# Patient Record
Sex: Female | Born: 1950 | Race: Black or African American | Hispanic: No | Smoking: Former smoker
Health system: Southern US, Community
[De-identification: ages and names within clinical notes are randomized; demographics above are authoritative.]

## PROBLEM LIST (undated history)

## (undated) DIAGNOSIS — I639 Cerebral infarction, unspecified: Secondary | ICD-10-CM

## (undated) DIAGNOSIS — E119 Type 2 diabetes mellitus without complications: Secondary | ICD-10-CM

## (undated) DIAGNOSIS — Z5189 Encounter for other specified aftercare: Secondary | ICD-10-CM

## (undated) DIAGNOSIS — K279 Peptic ulcer, site unspecified, unspecified as acute or chronic, without hemorrhage or perforation: Secondary | ICD-10-CM

## (undated) DIAGNOSIS — I2510 Atherosclerotic heart disease of native coronary artery without angina pectoris: Secondary | ICD-10-CM

## (undated) DIAGNOSIS — I10 Essential (primary) hypertension: Secondary | ICD-10-CM

## (undated) DIAGNOSIS — E7800 Pure hypercholesterolemia, unspecified: Secondary | ICD-10-CM

## (undated) HISTORY — DX: Cerebral infarction, unspecified: I63.9

---
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
## 1999-11-15 ENCOUNTER — Encounter: Admission: RE | Admit: 1999-11-15 | Discharge: 1999-11-15 | Payer: Self-pay | Admitting: *Deleted

## 1999-11-15 ENCOUNTER — Encounter: Payer: Self-pay | Admitting: *Deleted

## 1999-12-13 ENCOUNTER — Other Ambulatory Visit: Admission: RE | Admit: 1999-12-13 | Discharge: 1999-12-13 | Payer: Self-pay | Admitting: *Deleted

## 1999-12-27 ENCOUNTER — Encounter (INDEPENDENT_AMBULATORY_CARE_PROVIDER_SITE_OTHER): Payer: Self-pay | Admitting: Specialist

## 1999-12-27 ENCOUNTER — Other Ambulatory Visit: Admission: RE | Admit: 1999-12-27 | Discharge: 1999-12-27 | Payer: Self-pay | Admitting: *Deleted

## 2000-01-31 ENCOUNTER — Encounter (INDEPENDENT_AMBULATORY_CARE_PROVIDER_SITE_OTHER): Payer: Self-pay

## 2000-01-31 ENCOUNTER — Other Ambulatory Visit: Admission: RE | Admit: 2000-01-31 | Discharge: 2000-01-31 | Payer: Self-pay | Admitting: *Deleted

## 2000-03-17 ENCOUNTER — Encounter: Admission: RE | Admit: 2000-03-17 | Discharge: 2000-06-15 | Payer: Self-pay | Admitting: *Deleted

## 2000-04-15 ENCOUNTER — Other Ambulatory Visit: Admission: RE | Admit: 2000-04-15 | Discharge: 2000-04-15 | Payer: Self-pay | Admitting: *Deleted

## 2005-04-14 DIAGNOSIS — E119 Type 2 diabetes mellitus without complications: Secondary | ICD-10-CM

## 2005-04-14 DIAGNOSIS — E7800 Pure hypercholesterolemia, unspecified: Secondary | ICD-10-CM

## 2005-04-14 HISTORY — DX: Type 2 diabetes mellitus without complications: E11.9

## 2005-04-14 HISTORY — DX: Pure hypercholesterolemia, unspecified: E78.00

## 2005-06-25 ENCOUNTER — Emergency Department (HOSPITAL_COMMUNITY): Admission: EM | Admit: 2005-06-25 | Discharge: 2005-06-25 | Payer: Self-pay | Admitting: Emergency Medicine

## 2010-02-13 ENCOUNTER — Emergency Department (HOSPITAL_COMMUNITY): Admission: EM | Admit: 2010-02-13 | Discharge: 2010-02-13 | Payer: Self-pay | Admitting: Emergency Medicine

## 2010-06-25 LAB — URINALYSIS, ROUTINE W REFLEX MICROSCOPIC
Bilirubin Urine: NEGATIVE
Glucose, UA: NEGATIVE mg/dL
Ketones, ur: NEGATIVE mg/dL
Nitrite: NEGATIVE
Protein, ur: NEGATIVE mg/dL
Specific Gravity, Urine: 1.01 (ref 1.005–1.030)
Urobilinogen, UA: 0.2 mg/dL (ref 0.0–1.0)
pH: 5.5 (ref 5.0–8.0)

## 2010-06-25 LAB — CBC
HCT: 42.5 % (ref 36.0–46.0)
MCHC: 35.3 g/dL (ref 30.0–36.0)
MCV: 92.6 fL (ref 78.0–100.0)
RDW: 12.4 % (ref 11.5–15.5)

## 2010-06-25 LAB — DIFFERENTIAL
Basophils Relative: 1 % (ref 0–1)
Lymphs Abs: 1.3 10*3/uL (ref 0.7–4.0)
Monocytes Relative: 8 % (ref 3–12)
Neutro Abs: 3.7 10*3/uL (ref 1.7–7.7)
Neutrophils Relative %: 68 % (ref 43–77)

## 2010-06-25 LAB — COMPREHENSIVE METABOLIC PANEL
BUN: 10 mg/dL (ref 6–23)
Calcium: 9.4 mg/dL (ref 8.4–10.5)
Creatinine, Ser: 0.83 mg/dL (ref 0.4–1.2)
Glucose, Bld: 177 mg/dL — ABNORMAL HIGH (ref 70–99)
Total Protein: 8.2 g/dL (ref 6.0–8.3)

## 2010-06-25 LAB — URINE CULTURE: Culture  Setup Time: 201111021027

## 2010-06-25 LAB — URINE MICROSCOPIC-ADD ON

## 2011-10-09 IMAGING — CT CT ABD-PELV W/ CM
2 of 5 series · 17 of 46 positions shown, 19 images · IV contrast (APPLIED)
Comparison: None

CLINICAL DATA: Abdominal pain

CT ABDOMEN AND PELVIS WITH CONTRAST
TECHNIQUE: Multidetector CT imaging of the abdomen and pelvis was
performed following the standard protocol during bolus
administration of intravenous contrast.
Contrast: 100 ml of omni 300

[Series 2: abd/pelv with 5.0 b31f st · axial · 0.74mm/px · z∈[-478,-78]mm · 14 of 90 slices shown, 16 images]
[im 5/90  soft-tissue]
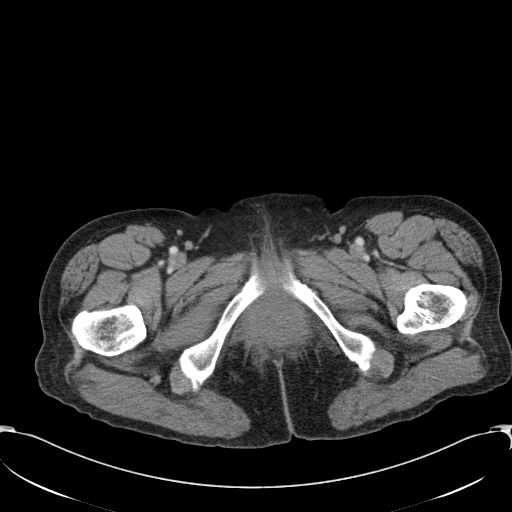
[im 5/90  bone]
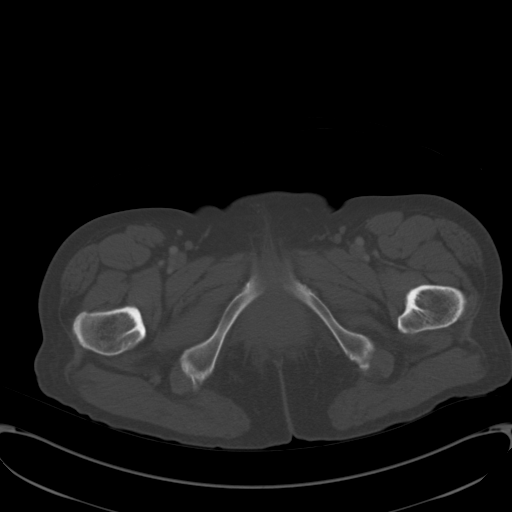
[im 10/90  soft-tissue]
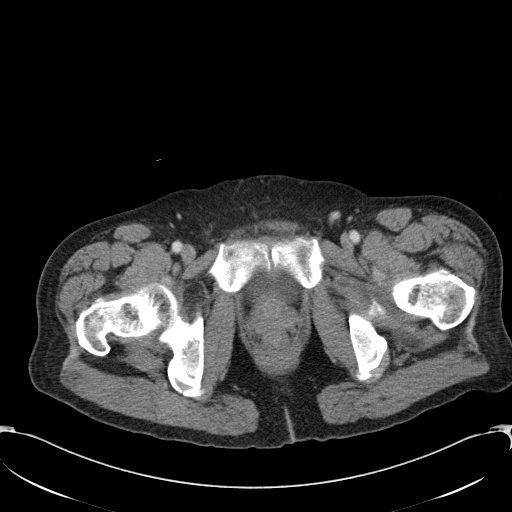
[im 19/90  soft-tissue]
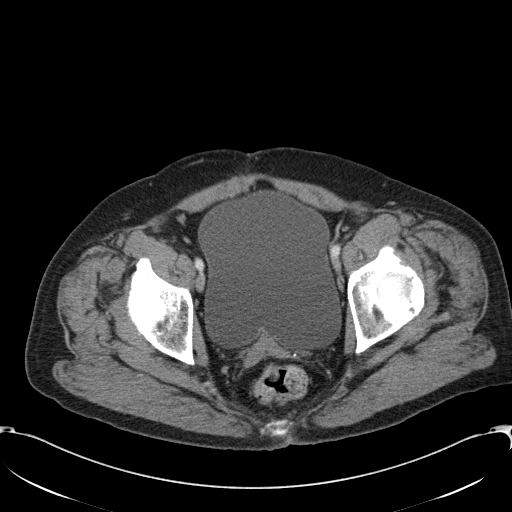
[im 24/90  soft-tissue]
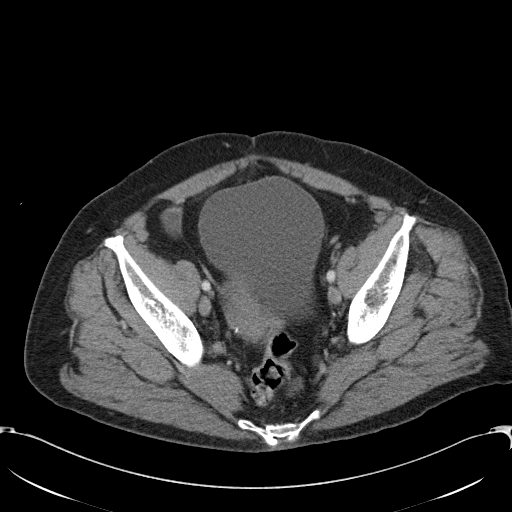
[im 29/90  soft-tissue]
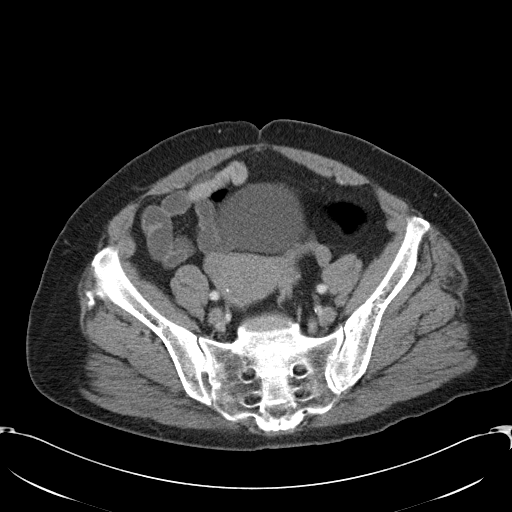
[im 38/90  soft-tissue]
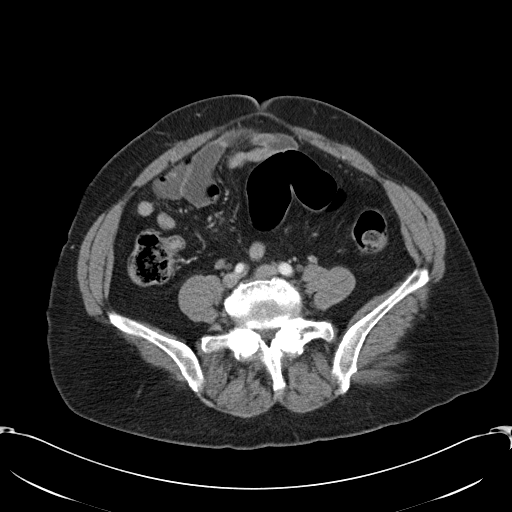
[im 43/90  soft-tissue]
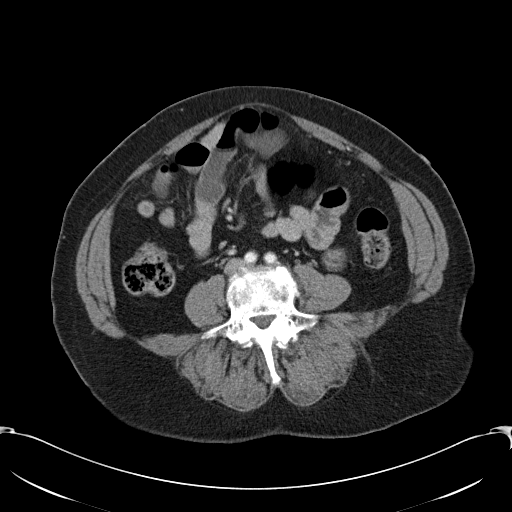
[im 47/90  soft-tissue]
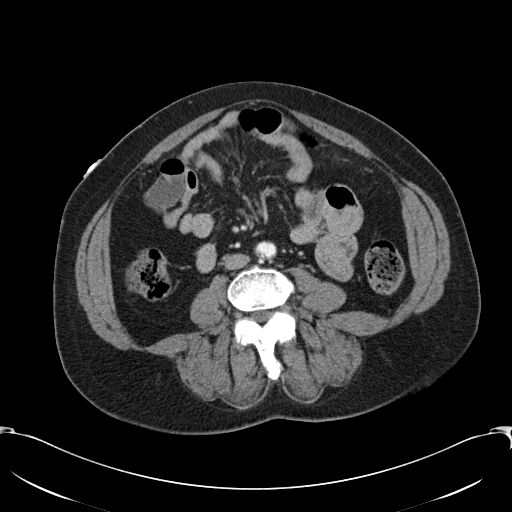
[im 52/90  soft-tissue]
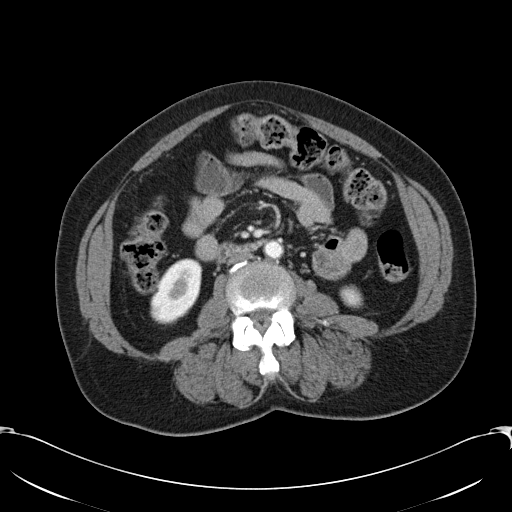
[im 52/90  bone]
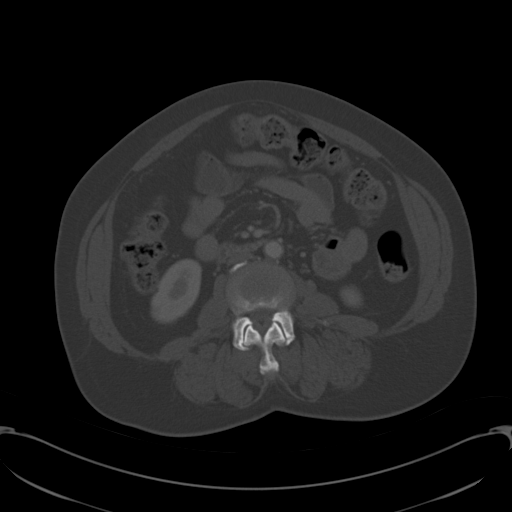
[im 61/90  soft-tissue]
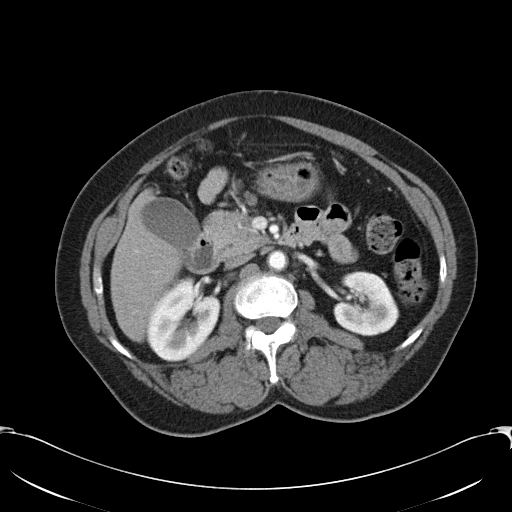
[im 66/90  soft-tissue]
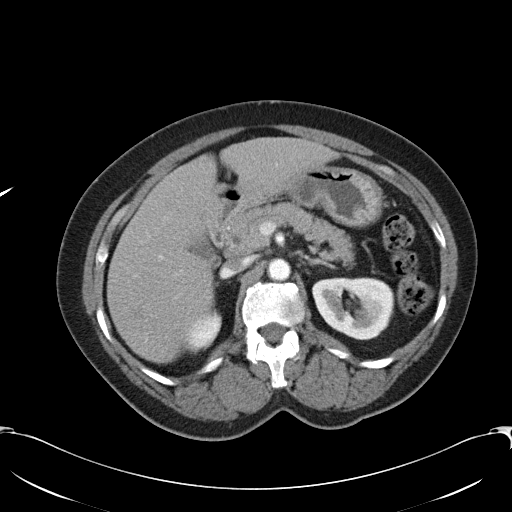
[im 71/90  soft-tissue]
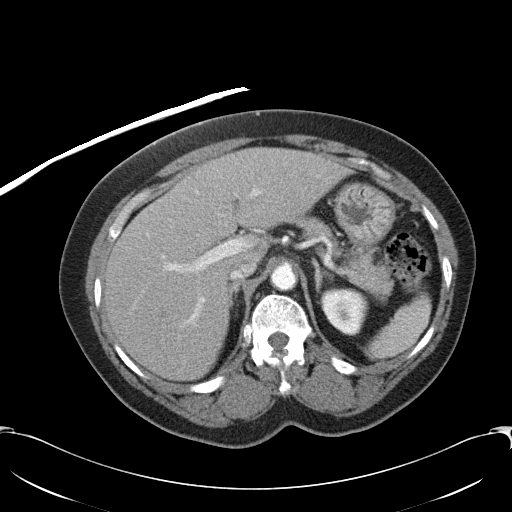
[im 80/90  soft-tissue]
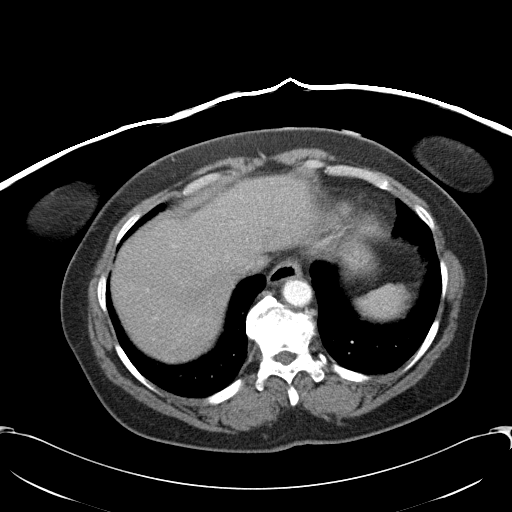
[im 85/90  soft-tissue]
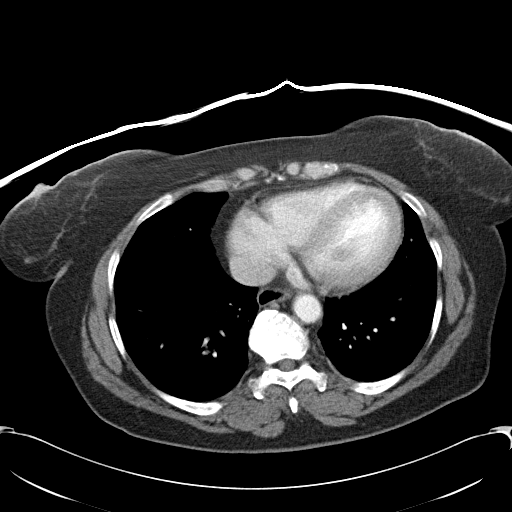

[Series 602: coronal · coronal · 0.87mm/px · 3 of 82 slices shown]
[im 28/82  soft-tissue]
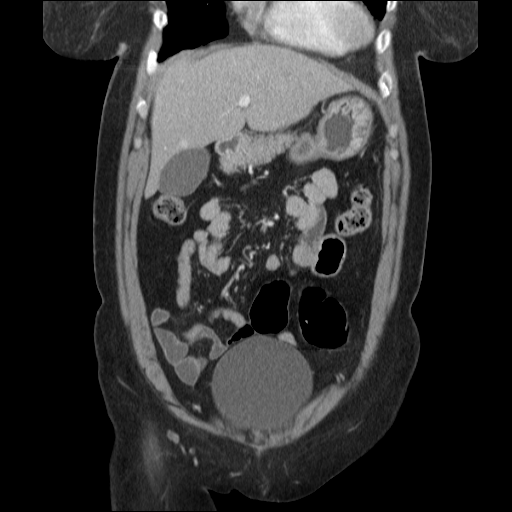
[im 37/82  soft-tissue]
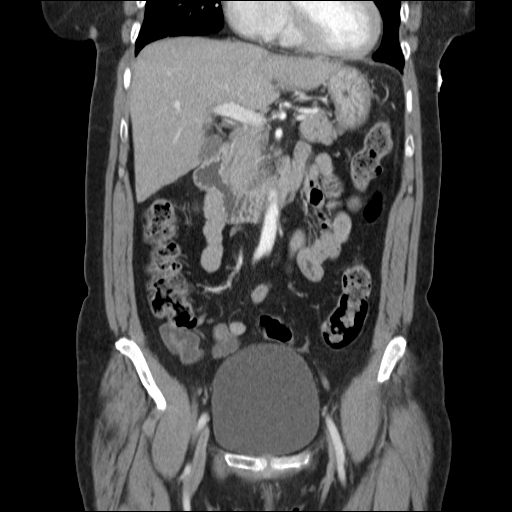
[im 46/82  soft-tissue]
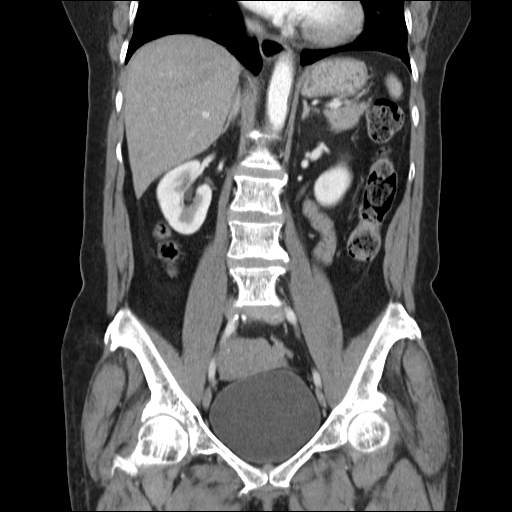

[17 of 46 positions shown; findings below may reference images not displayed]

FINDINGS: Mild diffuse ground glass attenuation throughout both lung bases
identified. Prominence of interlobular septa also noted.

No pleural effusion or pulmonary edema noted.

The spleen appears normal.

Negative pancreas.   The adrenal glands are unremarkable.  There
are no focal liver abnormalities identified.  The gallbladder
appears normal.

Both kidneys are unremarkable.  Tiny nonobstructing calculus is
identified within the mid right ureter measure the 3.8 mm.  This
does not result in any significant hydronephrosis or hydroureter.

 There are no enlarged upper abdominal or pelvic lymph nodes.
There is no free fluid or fluid collections within the abdomen or
pelvis.  Moderate distention of the urinary bladder identified.
The uterus and adnexal structures appear normal for patient's age.
The stomach, small bowel, and colon appear normal.  No evidence for
bowel perforation, bowel obstruction, or abscess formation.  The
appendix is not identified.
IMPRESSION: 1.  There is a tiny nonobstructing stone within the mid right
ureter measuring approximately 3-4 mm.
2.  Nonspecific interstitial abnormality is identified within both
lung bases.  This could be further assessed with high-resolution CT
of the chest and if clinically indicated.

## 2011-11-19 ENCOUNTER — Inpatient Hospital Stay (HOSPITAL_COMMUNITY)
Admission: EM | Admit: 2011-11-19 | Discharge: 2011-11-20 | DRG: 066 | Disposition: A | Discharge status: Home / Self Care | Payer: MEDICAID | Attending: Family Medicine | Admitting: Family Medicine

## 2011-11-19 ENCOUNTER — Encounter (HOSPITAL_COMMUNITY): Payer: Self-pay

## 2011-11-19 ENCOUNTER — Emergency Department (HOSPITAL_COMMUNITY): Payer: Self-pay

## 2011-11-19 DIAGNOSIS — R209 Unspecified disturbances of skin sensation: Secondary | ICD-10-CM | POA: Diagnosis present

## 2011-11-19 DIAGNOSIS — Z87891 Personal history of nicotine dependence: Secondary | ICD-10-CM

## 2011-11-19 DIAGNOSIS — I639 Cerebral infarction, unspecified: Secondary | ICD-10-CM

## 2011-11-19 DIAGNOSIS — E785 Hyperlipidemia, unspecified: Secondary | ICD-10-CM

## 2011-11-19 DIAGNOSIS — F17200 Nicotine dependence, unspecified, uncomplicated: Secondary | ICD-10-CM | POA: Diagnosis present

## 2011-11-19 DIAGNOSIS — I6350 Cerebral infarction due to unspecified occlusion or stenosis of unspecified cerebral artery: Principal | ICD-10-CM | POA: Diagnosis present

## 2011-11-19 DIAGNOSIS — E7800 Pure hypercholesterolemia, unspecified: Secondary | ICD-10-CM | POA: Diagnosis present

## 2011-11-19 DIAGNOSIS — Z8673 Personal history of transient ischemic attack (TIA), and cerebral infarction without residual deficits: Secondary | ICD-10-CM | POA: Diagnosis present

## 2011-11-19 DIAGNOSIS — I10 Essential (primary) hypertension: Secondary | ICD-10-CM

## 2011-11-19 DIAGNOSIS — Z79899 Other long term (current) drug therapy: Secondary | ICD-10-CM

## 2011-11-19 DIAGNOSIS — E119 Type 2 diabetes mellitus without complications: Secondary | ICD-10-CM | POA: Diagnosis present

## 2011-11-19 DIAGNOSIS — Z7982 Long term (current) use of aspirin: Secondary | ICD-10-CM

## 2011-11-19 HISTORY — DX: Pure hypercholesterolemia, unspecified: E78.00

## 2011-11-19 HISTORY — DX: Peptic ulcer, site unspecified, unspecified as acute or chronic, without hemorrhage or perforation: K27.9

## 2011-11-19 HISTORY — DX: Type 2 diabetes mellitus without complications: E11.9

## 2011-11-19 HISTORY — DX: Encounter for other specified aftercare: Z51.89

## 2011-11-19 LAB — POCT I-STAT, CHEM 8
BUN: 10 mg/dL (ref 6–23)
Chloride: 104 mEq/L (ref 96–112)
Creatinine, Ser: 1 mg/dL (ref 0.50–1.10)
Sodium: 140 mEq/L (ref 135–145)
TCO2: 24 mmol/L (ref 0–100)

## 2011-11-19 LAB — CBC WITH DIFFERENTIAL/PLATELET
Eosinophils Relative: 1 % (ref 0–5)
HCT: 39.3 % (ref 36.0–46.0)
Lymphocytes Relative: 32 % (ref 12–46)
Lymphs Abs: 1.6 10*3/uL (ref 0.7–4.0)
MCV: 89.1 fL (ref 78.0–100.0)
Neutro Abs: 2.9 10*3/uL (ref 1.7–7.7)
Platelets: 226 10*3/uL (ref 150–400)
RBC: 4.41 MIL/uL (ref 3.87–5.11)
WBC: 4.9 10*3/uL (ref 4.0–10.5)

## 2011-11-19 LAB — GLUCOSE, CAPILLARY: Glucose-Capillary: 203 mg/dL — ABNORMAL HIGH (ref 70–99)

## 2011-11-19 LAB — POCT I-STAT TROPONIN I

## 2011-11-19 MED ORDER — ONDANSETRON HCL 4 MG PO TABS
4.0000 mg | ORAL_TABLET | Freq: Three times a day (TID) | ORAL | Status: DC | PRN
Start: 2011-11-19 — End: 2011-11-20

## 2011-11-19 MED ORDER — CLOPIDOGREL BISULFATE 75 MG PO TABS
75.0000 mg | ORAL_TABLET | Freq: Every day | ORAL | Status: DC
  Administered 2011-11-20: 75 mg via ORAL
  Filled 2011-11-19: qty 1

## 2011-11-19 MED ORDER — HYDRALAZINE HCL 20 MG/ML IJ SOLN
5.0000 mg | Freq: Four times a day (QID) | INTRAMUSCULAR | Status: DC | PRN
  Filled 2011-11-19: qty 0.25

## 2011-11-19 MED ORDER — ENOXAPARIN SODIUM 40 MG/0.4ML ~~LOC~~ SOLN
40.0000 mg | SUBCUTANEOUS | Status: DC
  Administered 2011-11-19: 40 mg via SUBCUTANEOUS
  Filled 2011-11-19 (×2): qty 0.4

## 2011-11-19 MED ORDER — ACETAMINOPHEN 650 MG RE SUPP: 650.0000 mg | RECTAL | Status: DC | PRN

## 2011-11-19 MED ORDER — ACETAMINOPHEN 325 MG PO TABS: 650.0000 mg | ORAL_TABLET | ORAL | Status: DC | PRN

## 2011-11-19 MED ORDER — INSULIN ASPART 100 UNIT/ML ~~LOC~~ SOLN
0.0000 [IU] | Freq: Three times a day (TID) | SUBCUTANEOUS | Status: DC
  Administered 2011-11-20: 2 [IU] via SUBCUTANEOUS
  Administered 2011-11-20: 1 [IU] via SUBCUTANEOUS
  Administered 2011-11-20: 2 [IU] via SUBCUTANEOUS

## 2011-11-19 NOTE — ED Provider Notes (Addendum)
Pt turned over to me at change of shift to get MR/MRA results.  BP improved to 180 range without treatment.   19:24 Dr Adriana Simas, [facility] will admit, wants to see in ED before putting in orders.    [name] Head Wo Contrast  Mr Brain Wo Contrast  11/19/2011  *RADIOLOGY REPORT*  Clinical Data:  61 year old female with dizziness left-sided weakness, numbness and hypertension.  Comparison: None.  MRI HEAD WITHOUT CONTRAST  Technique: Multiplanar, multiecho pulse sequences of the brain and surrounding structures were obtained according to standard protocol without intravenous contrast.  Findings: There is a 15 mm confluent area of restricted diffusion in the right brainstem extending from the lower mid brain through the pons.  There is associated T2 and FLAIR hyperintensity.  No significant mass effect.  No evidence of associated hemorrhage.  The diffusion elsewhere is within normal limits.  Major intracranial vascular flow voids are preserved, MRA findings are below.  No ventriculomegaly.  No midline shift or intracranial mass lesion. Negative pituitary.  Supratentorial gray and white matter signal is within normal limits for age.  The moderate to severe ligamentous hypertrophy about the odontoid appears to be degenerative and there is sclerosis of the odontoid. This results in effacement of the pre medullary cistern and mild stenosis at the cervicomedullary junction.  Elsewhere, the visualized cervical spine is within normal limits.  No marrow edema or evidence of acute osseous abnormality. Visualized orbit soft tissues are within normal limits.  Visualized paranasal sinuses and mastoids are clear.  IMPRESSION: 1.  Acute right brainstem infarct.  No mass effect or hemorrhage. 2.  No other acute intracranial abnormality. 3.  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 4.  MRA findings are below.  MRA HEAD WITHOUT CONTRAST  Technique:  Angiographic images of the Circle of Willis were obtained using MRA technique without  intravenous contrast.  Findings: Antegrade flow in the posterior circulation.  Mildly dominant distal left vertebral artery.  Normal left PICA.  Patent distal right vertebral artery.  Normal right PICA.  Patent vertebrobasilar junction.  The basilar artery is patent without stenosis.  SCA and PCA origins are within normal limits.  Posterior communicating arteries are diminutive or absent.  Bilateral PCA branches are  Antegrade flow in both ICA siphons.  There is bilateral ICA irregularity greater on the right.  There is moderate to distal petrous right ICA stenosis (series 7 image 65).  There is moderate left supraclinoid ICA stenosis.  The carotid termini remain patent.  Ophthalmic artery origins are within normal limits.  MCA origins are within normal limits.  Irregularity of the right ACA A1 segment with preserved distal flow.  Ectatic anterior communicating artery without discrete aneurysm, and a tiny median artery of the corpus callosum is suspected.  Other visualized ACA branches are within normal limits.  Visualized bilateral MCA branches are within normal limits.  IMPRESSION: 1.  Negative posterior circulation. 2. 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 3.  Right ACA A1 segment moderate to severe stenosis.  Original Report Authenticated By: Harley Hallmark, M.D.   Diagnoses that have been ruled out:  None  Diagnoses that are still under consideration:  None  Final diagnoses:  Stroke  Hypertension    Plan admission  Devoria Albe, MD, Franz Dell, MD 11/19/11 1925  Ward Givens, MD 11/19/11 1610

## 2011-11-19 NOTE — ED Notes (Signed)
Per ems, pt complains of dizziness since Monday after shower, has not taken medication in over 3 years.

## 2011-11-19 NOTE — H&P (Signed)
Lisa [name] is [name] 61 y.o. female.    Chief Complaint: Left sided numbness x 3 days HPI:  61 yo F with a medical history significant for HTN and DM2 untreated for the past three years presents with numbness on her L side x  3 days.  The numbness started mid-day after her shower. Numbness is located on left face, left upper extremity, and toes of left lower extremity. She denies associated weakness or paresthesia.  Associated symptoms include dizziness while standing.  She denies vision changes, headaches, weakness, urinary or fecal incontinence.   She also denies pain, fever, chills, night sweats, nausea, vomiting, SOB and chest pain.   In the ED, patient was hypertensive with blood pressure of 224/111.  Concern for stroke prompted ED to obtain basic labs and MRI and MRA.  Labs unremarkable except for elevated blood glucose of 236.  Past Medical History  Diagnosis Date  . Blood transfusion   . High cholesterol 2007  . Diabetes mellitus 2007  . Peptic ulcer 1965    Past Surgical History  Procedure Date  . Cesarean section     Family History  Problem Relation Age of Onset  . Cancer Sister     Lung   . Cancer Brother     Throat    Social History:  reports that she has been smoking Cigarettes.  She has a 7.5 pack-year smoking history. She does not have any smokeless tobacco history on file. She reports that she drinks alcohol. She reports that she does not use illicit drugs.  Allergies:  Allergies  Allergen Reactions  . Aspirin Other (See Comments)    Irritates ulcers  . Codeine     "upsets myulcers"  . Hydrocodone     "upsets my ulcers"     Results for orders placed during the hospital encounter of 11/19/11 (from the past 48 hour(s))  CBC WITH DIFFERENTIAL     Status: Normal   Collection Time   11/19/11  2:01 PM      Component Value Range Comment   WBC 4.9  4.0 - 10.5 K/uL    RBC 4.41  3.87 - 5.11 MIL/uL    Hemoglobin 13.6  12.0 - 15.0 g/dL    HCT 16.1  09.6 - 04.5 %      MCV 89.1  78.0 - 100.0 fL    MCH 30.8  26.0 - 34.0 pg    MCHC 34.6  30.0 - 36.0 g/dL    RDW 40.9  81.1 - 91.4 %    Platelets 226  150 - 400 K/uL    Neutrophils Relative 60  43 - 77 %    Neutro Abs 2.9  1.7 - 7.7 K/uL    Lymphocytes Relative 32  12 - 46 %    Lymphs Abs 1.6  0.7 - 4.0 K/uL    Monocytes Relative 7  3 - 12 %    Monocytes Absolute 0.4  0.1 - 1.0 K/uL    Eosinophils Relative 1  0 - 5 %    Eosinophils Absolute 0.1  0.0 - 0.7 K/uL    Basophils Relative 0  0 - 1 %    Basophils Absolute 0.0  0.0 - 0.1 K/uL   POCT I-STAT TROPONIN I     Status: Normal   Collection Time   11/19/11  2:18 PM      Component Value Range Comment   Troponin i, poc 0.00  0.00 - 0.08 ng/mL    Comment 3  POCT I-STAT, CHEM 8     Status: Abnormal   Collection Time   11/19/11  2:19 PM      Component Value Range Comment   Sodium 140  135 - 145 mEq/L    Potassium 4.0  3.5 - 5.1 mEq/L    Chloride 104  96 - 112 mEq/L    BUN 10  6 - 23 mg/dL    Creatinine, Ser 1.02  0.50 - 1.10 mg/dL    Glucose, Bld 725 (*) 70 - 99 mg/dL    Calcium, Ion 3.66  4.40 - 1.30 mmol/L    TCO2 24  0 - 100 mmol/L    Hemoglobin 13.9  12.0 - 15.0 g/dL    HCT 34.7  42.5 - 95.6 %    EKG:  sinus rhythm, poor R wave progression, left axis deviation, LVH.   Mr Shirlee Latch Wo Contrast 11/19/2011 Findings: There is a 15 mm confluent area of restricted diffusion in the right brainstem extending from the lower mid brain through the pons.  There is associated T2 and FLAIR hyperintensity.  No significant mass effect.  No evidence of associated hemorrhage.  The diffusion elsewhere is within normal limits.  Major intracranial vascular flow voids are preserved, MRA findings are below.  No ventriculomegaly.  No midline shift or intracranial mass lesion. Negative pituitary.  Supratentorial gray and white matter signal is within normal limits for age.  The moderate to severe ligamentous hypertrophy about the odontoid appears to be degenerative and  there is sclerosis of the odontoid. This results in effacement of the pre medullary cistern and mild stenosis at the cervicomedullary junction.  Elsewhere, the visualized cervical spine is within normal limits.  No marrow edema or evidence of acute osseous abnormality. Visualized orbit soft tissues are within normal limits.  Visualized paranasal sinuses and mastoids are clear.  IMPRESSION: 1.  Acute right brainstem infarct.  No mass effect or hemorrhage. 2.  No other acute intracranial abnormality. 3.  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 4.  MRA findings are below.  MRA HEAD WITHOUT CONTRAST  Technique: Angiographic images of the Circle of Willis were obtained using MRA technique without  intravenous contrast.  Findings: Antegrade flow in the posterior circulation.  Mildly dominant distal left vertebral artery.  Normal left PICA.  Patent distal right vertebral artery.  Normal right PICA.  Patent vertebrobasilar junction.  The basilar artery is patent without stenosis.  SCA and PCA origins are within normal limits.  Posterior communicating arteries are diminutive or absent.  Bilateral PCA branches are  Antegrade flow in both ICA siphons.  There is bilateral ICA irregularity greater on the right.  There is moderate to distal petrous right ICA stenosis (series 7 image 65).  There is moderate left supraclinoid ICA stenosis.  The carotid termini remain patent.  Ophthalmic artery origins are within normal limits.  MCA origins are within normal limits.  Irregularity of the right ACA A1 segment with preserved distal flow.  Ectatic anterior communicating artery without discrete aneurysm, and a tiny median artery of the corpus callosum is suspected.  Other visualized ACA branches are within normal limits.  Visualized bilateral MCA branches are within normal limits.  IMPRESSION: 1.  Negative posterior circulation. 2. Mild to moderate  bilateral ICA siphon stenosis. 3.  Right ACA A1 segment moderate to severe stenosis.    Mr Brain Wo Contrast  11/19/2011  *RADIOLOGY REPORT*  Clinical Data:  61 year old female  with dizziness left-sided weakness, numbness and hypertension.  Comparison: None.  MRI HEAD WITHOUT CONTRAST  Technique: Multiplanar, multiecho pulse sequences of the brain and surrounding structures were obtained according to standard protocol without intravenous contrast.  Findings: There is a 15 mm confluent area of restricted diffusion in the right brainstem extending from the lower mid brain through the pons.  There is associated T2 and FLAIR hyperintensity.  No significant mass effect.  No evidence of associated hemorrhage.  The diffusion elsewhere is within normal limits.  Major intracranial vascular flow voids are preserved, MRA findings are below.  No ventriculomegaly.  No midline shift or intracranial mass lesion. Negative pituitary.  Supratentorial gray and white matter signal is within normal limits for age.  The moderate to severe ligamentous hypertrophy about the odontoid appears to be degenerative and there is sclerosis of the odontoid. This results in effacement of the pre medullary cistern and mild stenosis at the cervicomedullary junction.  Elsewhere, the visualized cervical spine is within normal limits.  No marrow edema or evidence of acute osseous abnormality. Visualized orbit soft tissues are within normal limits.  Visualized paranasal sinuses and mastoids are clear.  IMPRESSION: 1.  Acute right brainstem infarct.  No mass effect or hemorrhage. 2.  No other acute intracranial abnormality. 3.  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 4.  MRA findings are below.  MRA HEAD WITHOUT CONTRAST  Technique: Angiographic images of the Circle of Willis were obtained using MRA technique without  intravenous contrast.  Findings: Antegrade flow in  the posterior circulation.  Mildly dominant distal left vertebral artery.  Normal left PICA.  Patent distal right vertebral artery.  Normal right PICA.  Patent vertebrobasilar junction.  The basilar artery is patent without stenosis.  SCA and PCA origins are within normal limits.  Posterior communicating arteries are diminutive or absent.  Bilateral PCA branches are  Antegrade flow in both ICA siphons.  There is bilateral ICA irregularity greater on the right.  There is moderate to distal petrous right ICA stenosis (series 7 image 65).  There is moderate left supraclinoid ICA stenosis.  The carotid termini remain patent.  Ophthalmic artery origins are within normal limits.  MCA origins are within normal limits.  Irregularity of the right ACA A1 segment with preserved distal flow.  Ectatic anterior communicating artery without discrete aneurysm, and a tiny median artery of the corpus callosum is suspected.  Other visualized ACA branches are within normal limits.  Visualized bilateral MCA branches are within normal limits.  IMPRESSION: 1.  Negative posterior circulation. 2. 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 3.  Right ACA A1 segment moderate to severe stenosis.  Original Report Authenticated By: Harley Hallmark, M.D.    Review of Systems  Constitutional: Negative for fever and chills.  HENT: Negative for hearing loss and tinnitus.   Eyes: Negative for blurred vision.  Respiratory: Negative for shortness of breath.   Cardiovascular: Negative for chest pain.  Gastrointestinal: Negative for nausea, vomiting and abdominal pain.  Genitourinary: Negative for dysuria, urgency and frequency.  Musculoskeletal: Negative for joint pain.  Skin: Negative for rash.  Neurological: Positive for dizziness and sensory change. Negative for tingling, focal weakness, weakness and headaches.    Blood pressure 189/78, pulse 79, temperature 98.4 F (36.9 C), temperature source Oral, resp. rate 13, SpO2  100.00%. Physical Exam  BP 214/90  Pulse 84  Temp 98.4 F (36.9 C) (Oral)  Resp 21  SpO2 100% Constitutional: Well developed, well nourished. Awake and alert. NAD. HENT:  Head: Normocephalic and atraumatic.  Eyes: Conjunctivae are normal. Pupils are equal, round, and reactive to light.  Neck: Neck supple.  Cardiovascular: Irregular. +S1, S2.  No murmurs, rubs, or gallops auscultated. Pulmonary: CTAB. No rales, rhonchi, or wheeze. Abdomen: soft, nontender, nondistended. +BS x 4. Musculoskeletal: Normal range of motion. She exhibits no edema and no tenderness.  Neurological: alert and oriented x 3. CN 2-12 grossly intact. Upper and lower extremity muscle strength 5/5. Sensation intact. Brisk 3+ patellar reflex, symmetrical.  Normal gait. Negative Romberg sign. No pronator drift.  Skin: warm, dry, intact. Psychiatric: She has a normal mood and affect.   Assessment/Plan:  61 yo F with a medical history significant for HTN and DM2 untreated for the past three years presents with facial, left upper extremity, and left lower extremity numbness x 3 days.  1) Stroke - MRI/MRA were obtained. MRI was remarkable for acute right brainstem infarct. - Admit to Telemetry, Attending Dr. Mauricio Po - Stroke protocol initiated - NIH stroke score, Vital signs every 2 hours x 12 hours, then every 4 hours, Neuro checks every 2 hours x 12 hours, then every 4 hours, Stroke swallow screen, stroke education - Carotid dopplers, Echo ordered - Fasting lipid panel, A1C ordered - PT and OT eval ordered. - Patient treated with Plavix 75 mg, due to ASA intolerance (irritates stomach ulcers). - Will closely monitor.  2) HTN - Patient has not been treated for approximately 3 years.  - Blood pressure in the ED 224/111. - Will give IV Hydralazine for systolic >180.  Goal to decrease blood pressure by no more than 20% over 24 hours. Will reassess tomorrow. - Patient will need close outpatient follow up regarding  HTN  3) Diabetes mellitus, type 2.  - Patient has been untreated for approximately 3 years. - A1C ordered. - Glucose was elevated at admission 236. - Patient was started on sensitive SSI.   4) Hyperlipidemia - Patient reports history of PMH of hyperlipidemia. - Will consider starting statin therapy after results of lipid panel  5) Tobacco abuse - Patient has a  history of tobacco abuse. Reports minimum 2-3 cigarettes/day x 15 years. - Smoking cessation during hospitalization.  FEN/GI: Carb modified diet  PPX: Lovenox for dvt prophylaxis  Dispo: Pending clinical improvement and imaging/risk stratification labs. Case management consulted for medication assistance.   Code Status: Full code.     Everlene Other, PGY1 Family Medicine Teaching Service    I examined the patient with Dr. Adriana Simas. I have reviewed the note, made necessary revisions and agree with above.  Raman Featherston 11/20/11, 6:18 AM

## 2011-11-19 NOTE — ED Notes (Signed)
Family at bedside. 

## 2011-11-19 NOTE — ED Notes (Signed)
Patient waiting on MRI.

## 2011-11-19 NOTE — ED Notes (Signed)
MD at bedside. 

## 2011-11-19 NOTE — ED Notes (Signed)
Patient transported to MRI 

## 2011-11-19 NOTE — ED Notes (Signed)
Patient transported from MRI to room 

## 2011-11-19 NOTE — ED Provider Notes (Cosign Needed)
History     CSN: [id number]  Arrival date & time 11/19/11  1242   First MD Initiated Contact with Patient 11/19/11 1322      Chief Complaint  Patient presents with  . Dizziness  . Hypertension    (Consider location/radiation/quality/duration/timing/severity/associated sxs/prior treatment) HPI Complains of dizziness meaning lightheadedness, and numbness at left face left arm and left leg onset 2 days ago becoming worse today. No difficulty speaking no headache no difficulty walking or using her extremities. No difficulty with coordination. No treatment prior to coming here. Brought by EMS. Nothing makes symptoms better or worse. No pain Past Medical History  Diagnosis Date  . Cancer   . Blood transfusion   . High cholesterol     Past Surgical History  Procedure Date  . Cesarean section     No family history on file.  History  Substance Use Topics  . Smoking status: Current Everyday Smoker -- 1.0 packs/day  . Smokeless tobacco: Not on file  . Alcohol Use: No    OB History    Grav Para Term Preterm Abortions TAB SAB Ect Mult Living                  Review of Systems  Constitutional: Negative.   HENT: Negative.   Respiratory: Negative.   Cardiovascular: Negative.   Gastrointestinal: Negative.   Musculoskeletal: Negative.   Skin: Negative.   Neurological: Positive for dizziness and numbness.  Hematological: Negative.   Psychiatric/Behavioral: Negative.   All other systems reviewed and are negative.    Allergies  Aspirin; Codeine; and Hydrocodone  Home Medications  No current outpatient prescriptions on file.  BP 193/73  Pulse 86  Temp 98.4 F (36.9 C) (Oral)  Resp 18  SpO2 100%  Physical Exam  Nursing note and vitals reviewed. Constitutional: She appears well-developed and well-nourished.  HENT:  Head: Normocephalic and atraumatic.  Eyes: Conjunctivae are normal. Pupils are equal, round, and reactive to light.  Neck: Neck supple. No tracheal  deviation present. No thyromegaly present.  Cardiovascular: Normal rate and regular rhythm.   No murmur heard. Pulmonary/Chest: Effort normal and breath sounds normal.  Abdominal: Soft. Bowel sounds are normal. She exhibits no distension. There is no tenderness.  Musculoskeletal: Normal range of motion. She exhibits no edema and no tenderness.  Neurological: She is alert. Coordination normal.  Skin: Skin is warm and dry. No rash noted.  Psychiatric: She has a normal mood and affect.    ED Course  Procedures (including critical care time)  Date: 11/19/2011  Rate: 85  Rhythm: normal sinus rhythm  QRS Axis: normal  Intervals: normal  ST/T Wave abnormalities: nonspecific T wave changes  Conduction Disutrbances:none  Narrative Interpretation:   Old EKG Reviewed: none available Results for orders placed during the hospital encounter of 11/19/11  CBC WITH DIFFERENTIAL      Component Value Range   WBC 4.9  4.0 - 10.5 K/uL   RBC 4.41  3.87 - 5.11 MIL/uL   Hemoglobin 13.6  12.0 - 15.0 g/dL   HCT 40.9  81.1 - 91.4 %   MCV 89.1  78.0 - 100.0 fL   MCH 30.8  26.0 - 34.0 pg   MCHC 34.6  30.0 - 36.0 g/dL   RDW 78.2  95.6 - 21.3 %   Platelets 226  150 - 400 K/uL   Neutrophils Relative 60  43 - 77 %   Neutro Abs 2.9  1.7 - 7.7 K/uL   Lymphocytes Relative 32  12 - 46 %   Lymphs Abs 1.6  0.7 - 4.0 K/uL   Monocytes Relative 7  3 - 12 %   Monocytes Absolute 0.4  0.1 - 1.0 K/uL   Eosinophils Relative 1  0 - 5 %   Eosinophils Absolute 0.1  0.0 - 0.7 K/uL   Basophils Relative 0  0 - 1 %   Basophils Absolute 0.0  0.0 - 0.1 K/uL  POCT I-STAT, CHEM 8      Component Value Range   Sodium 140  135 - 145 mEq/L   Potassium 4.0  3.5 - 5.1 mEq/L   Chloride 104  96 - 112 mEq/L   BUN 10  6 - 23 mg/dL   Creatinine, Ser 1.47  0.50 - 1.10 mg/dL   Glucose, Bld 829 (*) 70 - 99 mg/dL   Calcium, Ion 5.62  1.30 - 1.30 mmol/L   TCO2 24  0 - 100 mmol/L   Hemoglobin 13.9  12.0 - 15.0 g/dL   HCT 86.5  78.4 -  69.6 %  POCT I-STAT TROPONIN I      Component Value Range   Troponin i, poc 0.00  0.00 - 0.08 ng/mL   Comment 3            No results found.   Labs Reviewed  CBC WITH DIFFERENTIAL   No results found.   No diagnosis found.    MDM  430 pm pt signed out to Dr. Lars Mage.  Dx #1 paresthesias #2 hypertension #3 hyperglycemia        Doug Sou, MD 11/19/11 [phone]

## 2011-11-19 NOTE — Progress Notes (Signed)
Report received from [place], [place]. Awaiting arrival of patient.

## 2011-11-20 DIAGNOSIS — E785 Hyperlipidemia, unspecified: Secondary | ICD-10-CM

## 2011-11-20 DIAGNOSIS — Z87891 Personal history of nicotine dependence: Secondary | ICD-10-CM

## 2011-11-20 DIAGNOSIS — I10 Essential (primary) hypertension: Secondary | ICD-10-CM | POA: Diagnosis present

## 2011-11-20 DIAGNOSIS — I6789 Other cerebrovascular disease: Secondary | ICD-10-CM

## 2011-11-20 DIAGNOSIS — Z8673 Personal history of transient ischemic attack (TIA), and cerebral infarction without residual deficits: Secondary | ICD-10-CM | POA: Diagnosis present

## 2011-11-20 DIAGNOSIS — I6350 Cerebral infarction due to unspecified occlusion or stenosis of unspecified cerebral artery: Secondary | ICD-10-CM

## 2011-11-20 LAB — CBC
Hemoglobin: 13.3 g/dL (ref 12.0–15.0)
RBC: 4.29 MIL/uL (ref 3.87–5.11)
WBC: 5.9 10*3/uL (ref 4.0–10.5)

## 2011-11-20 LAB — LIPID PANEL
LDL Cholesterol: UNDETERMINED mg/dL (ref 0–99)
VLDL: UNDETERMINED mg/dL (ref 0–40)

## 2011-11-20 LAB — CREATININE, SERUM
Creatinine, Ser: 0.79 mg/dL (ref 0.50–1.10)
GFR calc non Af Amer: 89 mL/min — ABNORMAL LOW (ref >90)

## 2011-11-20 LAB — GLUCOSE, CAPILLARY: Glucose-Capillary: 145 mg/dL — ABNORMAL HIGH (ref 70–99)

## 2011-11-20 MED ORDER — ASPIRIN EC 81 MG PO TBEC
81.0000 mg | DELAYED_RELEASE_TABLET | Freq: Every day | ORAL | Status: DC
  Administered 2011-11-20: 81 mg via ORAL
  Filled 2011-11-20: qty 1

## 2011-11-20 MED ORDER — LISINOPRIL 10 MG PO TABS
10.0000 mg | ORAL_TABLET | Freq: Every day | ORAL | Status: DC
  Administered 2011-11-20: 10 mg via ORAL
  Filled 2011-11-20: qty 1

## 2011-11-20 MED ORDER — METFORMIN HCL 500 MG PO TABS
500.0000 mg | ORAL_TABLET | Freq: Two times a day (BID) | ORAL | Status: DC
  Administered 2011-11-20: 500 mg via ORAL
  Filled 2011-11-20 (×2): qty 1

## 2011-11-20 MED ORDER — HYDROCHLOROTHIAZIDE 12.5 MG PO CAPS
12.5000 mg | ORAL_CAPSULE | Freq: Every day | ORAL | Status: DC
  Administered 2011-11-20: 12.5 mg via ORAL
  Filled 2011-11-20: qty 1

## 2011-11-20 MED ORDER — LISINOPRIL 10 MG PO TABS: 10.0000 mg | ORAL_TABLET | Freq: Every day | ORAL | Status: DC

## 2011-11-20 MED ORDER — ATORVASTATIN CALCIUM 20 MG PO TABS: 20.0000 mg | ORAL_TABLET | Freq: Every day | ORAL | Status: DC

## 2011-11-20 MED ORDER — PANTOPRAZOLE SODIUM 40 MG PO TBEC
40.0000 mg | DELAYED_RELEASE_TABLET | Freq: Every day | ORAL | Status: DC
  Administered 2011-11-20: 40 mg via ORAL
  Filled 2011-11-20: qty 1

## 2011-11-20 MED ORDER — OMEPRAZOLE 40 MG PO CPDR: 40.0000 mg | DELAYED_RELEASE_CAPSULE | Freq: Every day | ORAL | Status: DC

## 2011-11-20 MED ORDER — ATORVASTATIN CALCIUM 20 MG PO TABS
20.0000 mg | ORAL_TABLET | Freq: Every day | ORAL | Status: DC
  Administered 2011-11-20: 20 mg via ORAL
  Filled 2011-11-20: qty 1

## 2011-11-20 MED ORDER — METFORMIN HCL 500 MG PO TABS: 500.0000 mg | ORAL_TABLET | Freq: Two times a day (BID) | ORAL | Status: DC

## 2011-11-20 MED ORDER — ASPIRIN 81 MG PO TBEC: 81.0000 mg | DELAYED_RELEASE_TABLET | Freq: Every day | ORAL | Status: DC

## 2011-11-20 MED ORDER — HYDROCHLOROTHIAZIDE 12.5 MG PO CAPS: 12.5000 mg | ORAL_CAPSULE | Freq: Every day | ORAL | Status: DC

## 2011-11-20 NOTE — Evaluation (Signed)
I have read and agree with the below assessment and plan.   Sean Macwilliams Helen Whitlow PT, DPT Pager: [phone] 

## 2011-11-20 NOTE — Progress Notes (Signed)
Pt arrived to floor, oriented to room, patient safety guide signed, NIH performed, bed alarm on. Educational materials presented to patient regarding stroke. Will continue to monitor patient.

## 2011-11-20 NOTE — Progress Notes (Signed)
Clinical Child psychotherapist (CSW) has not received a consult for pt however CSW contacted the financial counselor to inform that pt is uninsured. The financial counselor will follow up with pt before dc. No CSW needs.  Theresia Bough, MSW, Theresia Majors [phone]

## 2011-11-20 NOTE — Discharge Summary (Signed)
Family Medicine Teaching [facility] Discharge Summary  Patient name: Lisa [name] Medical record number: [id number] Date of birth: [birth year] Age: 61 y.o. Gender: female Date of Admission: 11/19/2011  Date of Discharge: 11/20/11 Admitting Physician: Barbaraann Barthel, MD  Primary Care Provider: Dr. Rhae Hammock [facility]  Indication for Hospitalization: Left sided numbness  Discharge Diagnoses: Stroke Hypertension Hyperlipidemia Tobacco abuse  Brief Hospital Course:  Mrs. Tisdell is a 61 year old lady with a PMH of HTN, Diabetes mellitus, and tobacco abuse who presented with left sided numbness since 8/5.  Patient's HTN and diabetes have been untreated for the past 3 years due to financial reasons.   1) Stroke Patient presented with 3 day history of left upper extremity, left lower extremity, and left face numbness.  Symptoms were concerning for stroke.  Stroke protocol was then initiated.  MRI, MRA, Carotid dopplers, and Echo were obtained.  Fasting lipid panel, A1C, and TSH were also obtained. MRI revealed an acute right brainstem infarct and MRA was significant for moderate to severe stenosis of the right ACA A1 segment (for complete details see imaging section below).  Patient was then started on antiplatelet therapy with Plavix due to aspirin intolerance.  Patient stated that aspirin upsets her stomach and reported prior history of peptic ulcers.  Echo and carotid doppler studies were unremarkable.  Lipid panel revealed elevated triglycerides (466), elevated total cholesterol (267) and low HDL (38).  LDL and VLDL were not calculated due to markedly elevated triglycerides.  A1C was elevated at 9.2. TSH was within normal range.   Patient symptoms improved during hospitalization and patient tolerated antiplatelet therapy well.  Risk factors (hypertension, diabetes mellitus, and tobacco abuse) were addressed during admission.  Patient was switched from Plavix to aspirin prior to  discharge due to affordability of aspirin therapy.  PPI was added to prevent gastric irritation.   2) HTN Patient's blood pressure was 224/111 at admission.  Blood pressure was treated with IV hydralazine.  Following stabilization, Lisinopril and HCTZ were added to patients medication regimen.  Patient was discharged on both Lisinopril and HCTZ.  3) Diabetes mellitus, type 2 Patient was hyperglycemic with a blood glucose of 236 on admission.  Patient was started on SSI and blood glucose was stable during admission (146-195).  A1C was found to be 9.2.  Patient was educated on healthy diet and exercise.  Patient was discharged on Metformin.   4) Hyperlipidemia Patient reported a PMH of hyperlipidemia.  Patients lipid panel was obtained and was significant for elevated total cholesterol, markedly elevated triglycerides, and decreased HDL.  Patient was started on statin therapy and discharged on Lipitor.  5) Tobacco abuse Smoking cessation couseling/education was done during admission.  Patient is amendable to quitting.  Close outpatient follow up needed.   Significant Labs and Imaging: CBC    Component Value Date/Time   WBC 5.9 11/19/2011 2300   RBC 4.29 11/19/2011 2300   HGB 13.3 11/19/2011 2300   HCT 38.3 11/19/2011 2300   PLT 217 11/19/2011 2300   MCV 89.3 11/19/2011 2300   MCH 31.0 11/19/2011 2300   MCHC 34.7 11/19/2011 2300   RDW 12.7 11/19/2011 2300   LYMPHSABS 1.6 11/19/2011 1401   MONOABS 0.4 11/19/2011 1401   EOSABS 0.1 11/19/2011 1401   BASOSABS 0.0 11/19/2011 1401   BMET    Component Value Date/Time   NA 140 11/19/2011 1419   K 4.0 11/19/2011 1419   CL 104 11/19/2011 1419   CO2 28  02/13/2010 0950   GLUCOSE 236* 11/19/2011 1419   BUN 10 11/19/2011 1419   CREATININE 0.79 11/19/2011 2300   CALCIUM 9.4 02/13/2010 0950   GFRNONAA 89* 11/19/2011 2300   GFRAA >90 11/19/2011 2300    Lab Results  Component Value Date   HGBA1C 9.2* 11/20/2011   Lipid Panel     Component Value Date/Time   CHOL 267* 11/20/2011  0555   TRIG 466* 11/20/2011 0555   HDL 38* 11/20/2011 0555   CHOLHDL 7.0 11/20/2011 0555   VLDL UNABLE TO CALCULATE IF TRIGLYCERIDE OVER 400 mg/dL 0/12/6043 4098   LDLCALC UNABLE TO CALCULATE IF TRIGLYCERIDE OVER 400 mg/dL 04/14/9145 8295   Lab Results  Component Value Date   TSH 1.191 11/20/2011   CBG (last 3)   Basename 11/20/11 1643 11/20/11 1205 11/20/11 0654  GLUCAP 145* 159* 195*     MRI BRAIN  11/19/2011  IMPRESSION: 1.  Acute right brainstem infarct.  No mass effect or hemorrhage. 2.  No other acute intracranial abnormality. 3.  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 4.  MRA findings are below.   MRA HEAD WITHOUT CONTRAST     IMPRESSION: 1.  Negative posterior circulation. 2. 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 3.  Right ACA A1 segment moderate to severe stenosis.  Original Report Authenticated By: Harley Hallmark, M.D.   Carotid Dopplers - No significant extracranial carotid artery stenosis demonstrated. Vertebrals are patent with antegrade flow.  2D Echo - Left ventricle: The cavity size was normal. Wall thickness was normal. Systolic function was normal. The estimated ejection fraction was in the range of 55% to 60%. Wall motion was normal; there were no regional wall motion abnormalities. Doppler parameters are consistent with abnormal left ventricular relaxation (grade 1 diastolic dysfunction). - Left atrium: The atrium was mildly dilated.  Procedures: None  Consultations: None  Discharge Medications:  Medication List  As of 11/20/2011  8:21 PM   TAKE these medications         aspirin 81 MG EC tablet   Take 1 tablet (81 mg total) by mouth daily.      atorvastatin 20 MG tablet   Commonly known as: LIPITOR   Take 1 tablet (20 mg total) by mouth daily at 6 PM.      hydrochlorothiazide 12.5 MG capsule   Commonly known as: MICROZIDE   Take 1 capsule (12.5 mg total) by  mouth daily.      lisinopril 10 MG tablet   Commonly known as: PRINIVIL,ZESTRIL   Take 1 tablet (10 mg total) by mouth daily.      metFORMIN 500 MG tablet   Commonly known as: GLUCOPHAGE   Take 1 tablet (500 mg total) by mouth 2 (two) times daily with a meal.      omeprazole 40 MG capsule   Commonly known as: PRILOSEC   Take 1 capsule (40 mg total) by mouth daily.           Issues for Follow Up:  1) Patient's blood pressure was markedly elevated during admission.  Medications need to be titrated up to achieve goal BP of <130/80. 2) Make sure patient has obtained medications and is able to afford them.   3) Smoking cessation needs to be readdressed at follow up.  Patient is amendable to quitting.  4) Diabetes management and education should be re-addressed.  Patient will likely need additional oral therapy or insulin.  Outstanding Results:  None  Discharge Instructions: Please refer to Patient Instructions section of EMR for full details.  Patient was counseled important signs and symptoms that should prompt return to medical care, changes in medications, dietary instructions, activity restrictions, and follow up appointments.   Follow-up Information    Follow up with Dessa Phi, MD on 11/26/2011. (at 9:15)    Contact information:   [address] [place] [address] [phone]          Discharge Condition: Stable.  Everlene Other, DO 11/20/2011, 8:21 PM  I was not involved in patient' s discharge

## 2011-11-20 NOTE — Progress Notes (Signed)
*  PRELIMINARY RESULTS* Vascular Ultrasound Carotid Duplex (Doppler) has been completed.  Preliminary findings: Bilaterally no significant ICA stenosis with antegrade vertebral flow.  Farrel Demark, RDMS, RVT   11/20/2011, 11:08 AM

## 2011-11-20 NOTE — Evaluation (Signed)
Occupational Therapy Evaluation Patient Details Name: Lisa [name] MRN: [id number] DOB: 06-Nov-1950 Today's Date: 11/20/2011 Time: 7829-5621 OT Time Calculation (min): 24 min  OT Assessment / Plan / Recommendation Clinical Impression  Pt is a 61 yo female 61 yo F with a medical history significant for HTN and DM2 untreated for the past three years presents with numbness on her L side x  3 days.  MRI showed acute Rt. brainstem CVA.  Pt presents to OT with dizziness, impaired balance, impaired saccades, and impaired strength, coordination, and sensation Lt. UE.  feel she will benefit from continued OT to maximize independence and safety with BADLs.  Recommend HHOT vs. OPOT and 24 hour assistance at discharge    OT Assessment  Patient needs continued OT Services    Follow Up Recommendations  Home health OT;Outpatient OT;Supervision/Assistance - 24 hour (dependent upon transportation to OP)    Barriers to Discharge None    Equipment Recommendations  None recommended by PT;Tub/shower seat    Recommendations for Other Services    Frequency  Min 3X/week    Precautions / Restrictions Precautions Precautions: Fall Restrictions Weight Bearing Restrictions: No       ADL  Eating/Feeding: Simulated;Set up Where Assessed - Eating/Feeding: Chair Grooming: Performed;Wash/dry hands;Wash/dry face;Teeth care;Min guard Where Assessed - Grooming: Supported standing Upper Body Bathing: Simulated;Supervision/safety Where Assessed - Upper Body Bathing: Unsupported sitting Lower Body Bathing: Simulated;Min guard Where Assessed - Lower Body Bathing: Supported sit to stand Upper Body Dressing: Simulated;Minimal assistance (for fasteners) Where Assessed - Upper Body Dressing: Unsupported sitting Lower Body Dressing: Simulated;Performed;Min guard Where Assessed - Lower Body Dressing: Unsupported sit to stand Toilet Transfer: Simulated;Min guard Toilet Transfer Method: Sit to Writer: Comfort height toilet Toileting - Clothing Manipulation and Hygiene: Simulated;Min guard Where Assessed - Toileting Clothing Manipulation and Hygiene: Standing Transfers/Ambulation Related to ADLs: requires min guard assist ADL Comments: Pt. with complaint of the room spinning when standing to brush teeth, and requested to return to supine.  Pt. instructed in gaze stabilization to reduce dizziness     OT Diagnosis: Generalized weakness;Disturbance of vision  OT Problem List: Decreased strength;Decreased activity tolerance;Impaired balance (sitting and/or standing);Impaired vision/perception;Decreased coordination;Decreased knowledge of use of DME or AE;Impaired sensation;Impaired UE functional use OT Treatment Interventions: Self-care/ADL training;Neuromuscular education;DME and/or AE instruction;Therapeutic activities;Visual/perceptual remediation/compensation;Patient/family education;Balance training   OT Goals Acute Rehab OT Goals OT Goal Formulation: With patient Time For Goal Achievement: 12/04/11 Potential to Achieve Goals: Good ADL Goals Pt Will Perform Grooming: with supervision;Unsupported;Standing at sink ADL Goal: Grooming - Progress: Goal set today Pt Will Perform Upper Body Bathing: with set-up;Sitting, chair;Sitting, edge of bed ADL Goal: Upper Body Bathing - Progress: Goal set today Pt Will Perform Lower Body Bathing: with supervision;Sit to stand from chair;Sit to stand from bed ADL Goal: Lower Body Bathing - Progress: Goal set today Pt Will Perform Upper Body Dressing: with supervision;Unsupported;Sitting, bed;Sitting, chair ADL Goal: Upper Body Dressing - Progress: Goal set today Pt Will Perform Lower Body Dressing: with supervision;Sit to stand from chair;Sit to stand from bed ADL Goal: Lower Body Dressing - Progress: Goal set today Pt Will Transfer to Toilet: with supervision;Ambulation;Comfort height toilet ADL Goal: Toilet Transfer - Progress: Goal set  today Pt Will Perform Toileting - Clothing Manipulation: with supervision;Standing ADL Goal: Toileting - Clothing Manipulation - Progress: Goal set today Pt Will Perform Tub/Shower Transfer: with min assist;Grab bars;Ambulation;Shower seat with back ADL Goal: Web designer - Progress: Goal set today Additional ADL Goal #1:  Pt. will be independent with vision HEP ADL Goal: Additional Goal #1 - Progress: Goal set today Additional ADL Goal #2: Pt. will be independent with HEP for Lt. UE strengthening and [facility] ADL Goal: Additional Goal #2 - Progress: Goal set today  Visit Information  Last OT Received On: 11/20/11 Assistance Needed: +1    Subjective Data  Subjective: "I need to lay down, I'm dizzy again! Patient Stated Goal: To get better   Prior Functioning  Vision/Perception  Home Living Lives With: Spouse Available Help at Discharge: Family;Available 24 hours/day Type of Home: House Home Access: Level entry Home Layout: One level Bathroom Shower/Tub: Engineer, manufacturing systems: Standard Bathroom Accessibility: Yes How Accessible: Accessible via walker Home Adaptive Equipment: Grab bars in shower Prior Function Level of Independence: Independent Able to Take Stairs?: Yes Driving: No Vocation: Unemployed Communication Communication: No difficulties Dominant Hand: Right   Vision - Assessment Eye Alignment: Within Functional Limits Vision Assessment: Vision tested Ocular Range of Motion: Within Functional Limits Tracking/Visual Pursuits: Able to track stimulus in all quads without difficulty Saccades: Undershoots (decreased control with mild nystagumus on Lt.) Visual Fields: No apparent deficits Additional Comments: Pt able to read paragraph of information without difficulty Perception Perception: Within Functional Limits Praxis Praxis: Intact  Cognition  Overall Cognitive Status: Impaired Area of Impairment: Awareness of deficits Arousal/Alertness:  Awake/alert Orientation Level: Appears intact for tasks assessed Behavior During Session: Flat affect Awareness of Deficits: when initially asked, pt. states her only deficit is numbness in her face.  However, once pt. started to perform activities, she acknowledged impaired sensation and coordination Lt. hand and impaired balance and dizziness    Extremity/Trunk Assessment Right Upper Extremity Assessment RUE ROM/Strength/Tone: WFL for tasks assessed RUE Sensation: WFL - Light Touch RUE Coordination: WFL - gross/fine motor Left Upper Extremity Assessment LUE ROM/Strength/Tone: Deficits LUE ROM/Strength/Tone Deficits: Strength 4/5 throughout LUE Sensation: Deficits LUE Sensation Deficits: pt. with impaired light touch LUE Coordination: Deficits LUE Coordination Deficits: impaired FMC - difficulty manipulating small objects Right Lower Extremity Assessment RLE ROM/Strength/Tone: WFL for tasks assessed Left Lower Extremity Assessment LLE ROM/Strength/Tone: WFL for tasks assessed Trunk Assessment Trunk Assessment: Normal   Mobility Bed Mobility Details for Bed Mobility Assistance: Pt sitting EOB upon presentation. Transfers Transfers: Sit to Stand;Stand to Sit Sit to Stand: 4: Min guard;With upper extremity assist;From chair/3-in-1 Stand to Sit: 4: Min guard;To bed;With upper extremity assist Details for Transfer Assistance: Pt required min guard for sit to stand for safety due to pt reporting previous dizziness when performing sit to stand. Pt required supervision for stand to sit for safety.   Exercise    Balance Balance Balance Assessed: No  End of Session OT - End of Session Activity Tolerance: Other (comment) (dizziness) Patient left: in bed;with call bell/phone within reach;with bed alarm set;with family/visitor present  GO     Annabella Elford, Ursula Alert M 11/20/2011, 3:46 PM

## 2011-11-20 NOTE — Evaluation (Signed)
Physical Therapy Evaluation Patient Details Name: Lisa [name] MRN: [id number] DOB: 22-Jun-1950 Today's Date: 11/20/2011 Time: 8295-6213 PT Time Calculation (min): 10 min  PT Assessment / Plan / Recommendation Clinical Impression  Pt is a 61 yo female 61 yo F with a medical history significant for HTN and DM2 untreated for the past three years presents with numbness on her L side x  3 days.  Numbness is located on left face, left upper extremity, and toes of left lower extremity. She denies associated weakness or paresthesia.  Associated symptoms include dizziness while standing.  Pt presented to physical therapy evaluation with decreased balance. Pt's evaluation was limited by BP of 196/96. Further balance testing at next session. Pt will benefit from skilled physical therapy in the acute care setting to address balance deficits.     PT Assessment  Patient needs continued PT services    Follow Up Recommendations  TBD   Barriers to Discharge        Equipment Recommendations  None recommended by PT    Recommendations for Other Services     Frequency Min 3X/week    Precautions / Restrictions Precautions Precautions: Fall Restrictions Weight Bearing Restrictions: No   Pertinent Vital Signs BP 196/96. RN notified.     Mobility  Bed Mobility Details for Bed Mobility Assistance: Pt sitting EOB upon presentation. Transfers Transfers: Sit to Stand;Stand to Sit Sit to Stand: 4: Min guard;From bed;With upper extremity assist Stand to Sit: 5: Supervision;To chair/3-in-1;With upper extremity assist Details for Transfer Assistance: Pt required min guard for sit to stand for safety due to pt reporting previous dizziness when performing sit to stand. Pt required supervision for stand to sit for safety. Ambulation/Gait Ambulation/Gait Assistance: 4: Min guard Ambulation Distance (Feet): 10 Feet Ambulation/Gait Assistance Details: Pt required min guard for ambulation due to one episode of  LOB. Gait Pattern: Step-through pattern;Decreased stride length;Narrow base of support General Gait Details: Pt ambulated with slow gait pattern and was cautious with all movement. Stairs: No    Exercises     PT Diagnosis:    PT Problem List: Decreased activity tolerance;Decreased balance;Decreased mobility PT Treatment Interventions: Gait training;Functional mobility training;Therapeutic activities;Therapeutic exercise;Balance training;Neuromuscular re-education;Patient/family education   PT Goals Acute Rehab PT Goals PT Goal Formulation: With patient Time For Goal Achievement: 12/04/11 Potential to Achieve Goals: Good Pt will go Sit to Stand: with modified independence PT Goal: Sit to Stand - Progress: Goal set today Pt will go Stand to Sit: with modified independence PT Goal: Stand to Sit - Progress: Goal set today Pt will Ambulate: >150 feet;with modified independence;with gait velocity >(comment) ft/second (>1.8m/s) PT Goal: Ambulate - Progress: Goal set today  Visit Information  Last PT Received On: 11/20/11 Assistance Needed: +1    Subjective Data  Subjective: "I am feeling better."   Prior Functioning  Home Living Lives With: Spouse Available Help at Discharge: Family;Available 24 hours/day Type of Home: House Home Access: Level entry Home Layout: One level Bathroom Shower/Tub: Engineer, manufacturing systems: Standard Home Adaptive Equipment: Grab bars in shower Prior Function Level of Independence: Independent Driving: No Vocation: Unemployed Musician: No difficulties    Cognition  Overall Cognitive Status: Appears within functional limits for tasks assessed/performed Arousal/Alertness: Awake/alert Orientation Level: Appears intact for tasks assessed Behavior During Session: Flat affect    Extremity/Trunk Assessment Right Upper Extremity Assessment RUE ROM/Strength/Tone: [facility] for tasks assessed Left Upper Extremity Assessment LUE  ROM/Strength/Tone: WFL for tasks assessed Right Lower Extremity Assessment RLE ROM/Strength/Tone:  WFL for tasks assessed Left Lower Extremity Assessment LLE ROM/Strength/Tone: [facility] for tasks assessed   Balance Balance Balance Assessed: No  End of Session PT - End of Session Equipment Utilized During Treatment: Gait belt Activity Tolerance: Patient tolerated treatment well;Treatment limited secondary to medical complications (Comment) (BP: 196/96) Patient left: in chair;with call bell/phone within reach;with family/visitor present Nurse Communication: Mobility status  GP     Jacek Colson 11/20/2011, 3:21 PM

## 2011-11-20 NOTE — Progress Notes (Signed)
Family Medicine Teaching Service Daily Progress Note Service Page: [phone]  Subjective:  Patient is feeling well this am, but states that facial numbness (L side) and left finger numbness still present. L lower extremity numbness of the toes has subsided.  No other complaints this a.m. Denies SOB, chest pain, N/V.  Objective: Temp:  [97.9 F (36.6 C)-98.6 F (37 C)] 98.3 F (36.8 C) (08/08 0600) Pulse Rate:  [67-88] 76  (08/08 0600) Resp:  [13-100] 18  (08/08 0600) BP: (136-224)/(72-116) 176/73 mmHg (08/08 0600) SpO2:  [98 %-100 %] 100 % (08/08 0600) Weight:  [155 lb 6.4 oz (70.489 kg)] 155 lb 6.4 oz (70.489 kg) (08/07 2115)  Exam: General: alert and awake. Resting comfortable in bed this am. Cardiovascular: Irregular. Normal rate. No murmurs, rubs, or gallops auscultated. Respiratory: CTAB. No rales, rhonchi, or wheeze. Abdomen: Soft, nontender, nondistended. Extremities: warm, well perfused. No lower extremity edema.  Neuro: 5/5 upper and lower extremity muscle strength. Sensation intact but patient reports facial and L finger numbness.  Patellar, achilles, brachioradialis, biceps, triceps reflexes 2+ and symmetrical.    I have reviewed the patient's medications, labs, imaging, and diagnostic testing.  Notable results are summarized below.  CBC    Component Value Date/Time   WBC 5.9 11/19/2011 2300   RBC 4.29 11/19/2011 2300   HGB 13.3 11/19/2011 2300   HCT 38.3 11/19/2011 2300   PLT 217 11/19/2011 2300   MCV 89.3 11/19/2011 2300   MCH 31.0 11/19/2011 2300   MCHC 34.7 11/19/2011 2300   RDW 12.7 11/19/2011 2300   LYMPHSABS 1.6 11/19/2011 1401   MONOABS 0.4 11/19/2011 1401   EOSABS 0.1 11/19/2011 1401   BASOSABS 0.0 11/19/2011 1401   BMET    Component Value Date/Time   NA 140 11/19/2011 1419   K 4.0 11/19/2011 1419   CL 104 11/19/2011 1419   CO2 28 02/13/2010 0950   GLUCOSE 236* 11/19/2011 1419   BUN 10 11/19/2011 1419   CREATININE 0.79 11/19/2011 2300   CALCIUM 9.4 02/13/2010 0950   GFRNONAA 89*  11/19/2011 2300   GFRAA >90 11/19/2011 2300   Lipid Panel     Component Value Date/Time   CHOL 267* 11/20/2011 0555   TRIG 466* 11/20/2011 0555   HDL 38* 11/20/2011 0555   CHOLHDL 7.0 11/20/2011 0555   VLDL UNABLE TO CALCULATE IF TRIGLYCERIDE OVER 400 mg/dL 07/17/4096 1191   LDLCALC UNABLE TO CALCULATE IF TRIGLYCERIDE OVER 400 mg/dL 07/19/8293 6213   Y8M - Pending. 2D Echo, Carotid dopplers - ordered.  Imaging/Diagnostic Tests:  MRI/MRA Head  11/19/2011  MRI HEAD WITHOUT CONTRAST IMPRESSION: 1.  Acute right brainstem infarct.  No mass effect or hemorrhage. 2.  No other acute intracranial abnormality. 3.  Mild stenosis at the cervicomedullary junction related to pronounced degenerative ligamentous hypertrophy about the odontoid   MRA HEAD WITHOUT CONTRAST   IMPRESSION: 1.  Negative posterior circulation. 2. 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 3.  Right ACA A1 segment moderate to severe stenosis.    Assessment/Plan:  61 yo F with a medical history significant for HTN and DM2 untreated for the past three years presents with facial, left upper extremity, and left lower extremity numbness x 3 days.   1) Stroke  - MRI/MRA were obtained. MRI was remarkable for acute right brainstem infarct.  - Carotid dopplers, Echo today - Lipid panel - Total cholesterol 267, Triglycerides 466, HDL 38 - PT and OT eval & treat - Will closely monitor.  - Will  switch Plavix to Aspirin + PPI (given medication affordability issues).   2) HTN  - BP improving - 176/73 this am.  - Will continue to treat with PRN Hydralazine for systolic >180. - HCTZ and Lisinopril will be added today  3) Diabetes mellitus, type 2.  - Patient has been untreated for approximately 3 years.  - A1C in process. - CBG qac, qhs. - Patient was started on sensitive SSI.  - CBG - 195 this am. - Will add metformin.  4) Hyperlipidemia  - Patient reports history of PMH of hyperlipidemia.  - Lipid panel - Total cholesterol 267,  Triglycerides 466, HDL 38. - Starting Lipitor 20 mg today.  5) Tobacco abuse  - Patient has a history of tobacco abuse. Reports minimum 2-3 cigarettes/day x 15 years.  - Smoking cessation counseling during hospitalization.   6) Social issues/Medication assistance - Case management consulted for medication assistance, as patient indicates that she has been unable to afford medications.  FEN/GI: Carb modified diet  PPX: Lovenox for dvt prophylaxis  Dispo: Pending clinical improvement and imaging/risk stratification labs. Case management consulted for medication assistance.  Code Status: Full code.  Everlene Other, DO 11/20/2011, 8:02 AM

## 2011-11-20 NOTE — Progress Notes (Signed)
  Echocardiogram 2D Echocardiogram has been performed.  Georgian Co 11/20/2011, 3:04 PM

## 2011-11-20 NOTE — H&P (Signed)
FMTS Attending Admit Note Patient seen and examined by me, discussed with resident team and I agree with their assess/plan as documented in this note.  Briefly, 61yoF with untreated HTN, DM2 who presents with left-sided facial numbness and dizziness which began around 1pm on Monday, August 5th after her shower.  Initially associated with some nausea, no emesis.  Denies visual changes, motor involvement, chest pain or fevers/chills.  She has continued to feel dizzy and to have the L facial numbness.  No trouble swallowing, no dysarthria at any point.    MRI report, ECG and telemetry reviewed. MRA showing mod-severe stenosis R ACA.  Tele without dysrhythmias. Labs with markedly elevated TGs, pending HbA1C.  Exam Alert, emotionally labile, cooperative and in no acute distress HEENT Neck supple. Symmetric smile. No ptosis noted.  COR regular S1S2 PULM  Clear bilaterally.  NEURO Patient able to get out of armchair and walk with 1-point assist (more for her reassurance).  Strength in handgrip, wrists and UEs full and symmetric bilat.    Assess/Plan: 61yoF with acute right brainstem ischemic CVA; several risk factors for recurrent stroke and/or other vascular events.  I agree with gradual reduction in BP, preferably with ACEI in light of DM2 diagnosis.  Intolerant of ASA due to PUD diagnosis; may want to involve case mgr +/- CSW for assistance in applying for MAP program if patient is to be maintained on Plavix. TGs should improve as glucose comes under better control; consider starting metformin as patient is not on any agents for DM2 at this time. Diet counseling. PT/OT assessment, patient lives independently with her husband here in [place]. She indicates willingness to be able to follow with [facility] for ongoing needs.  Becomes tearful when I address this; would consider depression screening at some point as she is at high risk.  Paula Compton, MD

## 2011-11-21 NOTE — Progress Notes (Signed)
Retro ur ins review. 

## 2011-11-22 MED ORDER — LISINOPRIL 10 MG PO TABS: 10.0000 mg | ORAL_TABLET | Freq: Every day | ORAL | Status: DC

## 2011-11-22 MED ORDER — ATORVASTATIN CALCIUM 20 MG PO TABS: 20.0000 mg | ORAL_TABLET | Freq: Every day | ORAL | Status: DC

## 2011-11-22 MED ORDER — HYDROCHLOROTHIAZIDE 12.5 MG PO CAPS: 12.5000 mg | ORAL_CAPSULE | Freq: Every day | ORAL | Status: DC

## 2011-11-22 MED ORDER — METFORMIN HCL 500 MG PO TABS: 500.0000 mg | ORAL_TABLET | Freq: Two times a day (BID) | ORAL | Status: DC

## 2011-11-26 ENCOUNTER — Ambulatory Visit: Payer: Self-pay | Admitting: Family Medicine

## 2011-11-27 ENCOUNTER — Encounter: Payer: Self-pay | Admitting: Family Medicine

## 2011-11-27 ENCOUNTER — Ambulatory Visit (INDEPENDENT_AMBULATORY_CARE_PROVIDER_SITE_OTHER): Payer: Self-pay | Admitting: Family Medicine

## 2011-11-27 VITALS — BP 230/116 | HR 101 | Temp 98.6°F | Ht 65.0 in | Wt 152.0 lb

## 2011-11-27 DIAGNOSIS — F17200 Nicotine dependence, unspecified, uncomplicated: Secondary | ICD-10-CM

## 2011-11-27 DIAGNOSIS — I639 Cerebral infarction, unspecified: Secondary | ICD-10-CM

## 2011-11-27 DIAGNOSIS — Z720 Tobacco use: Secondary | ICD-10-CM

## 2011-11-27 DIAGNOSIS — I10 Essential (primary) hypertension: Secondary | ICD-10-CM

## 2011-11-27 DIAGNOSIS — I6350 Cerebral infarction due to unspecified occlusion or stenosis of unspecified cerebral artery: Secondary | ICD-10-CM

## 2011-11-27 MED ORDER — HYDRALAZINE HCL 10 MG PO TABS: 10.0000 mg | ORAL_TABLET | Freq: Three times a day (TID) | ORAL | Status: DC

## 2011-11-27 NOTE — Assessment & Plan Note (Signed)
A: down to 2 cigarettes per day.  P: cessation.

## 2011-11-27 NOTE — Assessment & Plan Note (Signed)
A:  Extremely high placing patient at risk for recurrent stroke.  Meds: on lisinopril and HCTZ x 2 days now P:  -continue current regimen -add hydralazine 10 mg TID -patient is aware she must quit smoking and has agreed to quit.  -close f//u with me in 1 week.

## 2011-11-27 NOTE — Progress Notes (Signed)
Subjective:     Patient ID: Lisa [name], female   DOB: 06-08-1950, 61 y.o.   MRN: [id number]  HPI 61 yo F presents for hospital f/u following an ischemic cerebellar stroke. She was discharge from the hospital on 11/19/11. Since her discharge she reports persistent L sided numbness. She picked up and started taking her medications two days ago. She has smoked 2 cigarettes per day. She denies new sensory deficits, weakness, headache, chest pain, SOB and LE edema.   Review of Systems As per HPI     Objective:   Physical Exam BP 230/116  Pulse 101  Temp 98.6 F (37 C) (Oral)  Ht 5\' 5"  (1.651 m)  Wt 152 lb (68.947 kg)  BMI 25.29 kg/m2 General appearance: alert, cooperative and no distress Head: Normocephalic, without obvious abnormality, atraumatic Lungs: clear to auscultation bilaterally Heart: regular rate and rhythm, S1, S2 normal, no murmur, click, rub or gallop Extremities: extremities normal, atraumatic, no cyanosis or edema Neurologic: Grossly normal, decrease sensation on L with fine touch.     Assessment and Plan:

## 2011-11-27 NOTE — Assessment & Plan Note (Signed)
A: on ACEi, thiazide diuretic, aspirin and stating for secondary prevention.  P: continue current regimen, maximize treatment.

## 2011-11-27 NOTE — Patient Instructions (Addendum)
Mrs. Garverick,  Thank you for coming in today. Please pick up and start taking hydralazine 10 mg daily.   F/u in 1 week.   Dr. Armen Pickup

## 2011-12-02 ENCOUNTER — Ambulatory Visit: Payer: Self-pay | Admitting: Family Medicine

## 2011-12-11 ENCOUNTER — Encounter: Payer: Self-pay | Admitting: Family Medicine

## 2011-12-11 ENCOUNTER — Ambulatory Visit (INDEPENDENT_AMBULATORY_CARE_PROVIDER_SITE_OTHER): Payer: Self-pay | Admitting: Family Medicine

## 2011-12-11 VITALS — BP 204/90 | HR 97 | Temp 98.0°F | Ht 65.0 in | Wt 148.0 lb

## 2011-12-11 DIAGNOSIS — Z8673 Personal history of transient ischemic attack (TIA), and cerebral infarction without residual deficits: Secondary | ICD-10-CM

## 2011-12-11 DIAGNOSIS — I10 Essential (primary) hypertension: Secondary | ICD-10-CM

## 2011-12-11 DIAGNOSIS — Z8639 Personal history of other endocrine, nutritional and metabolic disease: Secondary | ICD-10-CM | POA: Insufficient documentation

## 2011-12-11 DIAGNOSIS — E119 Type 2 diabetes mellitus without complications: Secondary | ICD-10-CM

## 2011-12-11 DIAGNOSIS — F329 Major depressive disorder, single episode, unspecified: Secondary | ICD-10-CM

## 2011-12-11 MED ORDER — LISINOPRIL 20 MG PO TABS: 20.0000 mg | ORAL_TABLET | Freq: Every day | ORAL | Status: DC

## 2011-12-11 MED ORDER — HYDROCHLOROTHIAZIDE 25 MG PO TABS: 25.0000 mg | ORAL_TABLET | Freq: Every day | ORAL | Status: DC

## 2011-12-11 MED ORDER — CITALOPRAM HYDROBROMIDE 20 MG PO TABS: 20.0000 mg | ORAL_TABLET | Freq: Every day | ORAL | Status: DC

## 2011-12-11 MED ORDER — METFORMIN HCL 500 MG PO TABS: 500.0000 mg | ORAL_TABLET | Freq: Two times a day (BID) | ORAL | Status: DC

## 2011-12-11 MED ORDER — AMLODIPINE BESYLATE 5 MG PO TABS: 5.0000 mg | ORAL_TABLET | Freq: Every day | ORAL | Status: DC

## 2011-12-11 NOTE — Assessment & Plan Note (Signed)
A:symptoms controlled.  P:  refill metformin 500 mg BID.  Titrate up as tolerated.

## 2011-12-11 NOTE — Progress Notes (Signed)
Subjective:     Patient ID: Lisa [name], female   DOB: May 08, 1950, 61 y.o.   MRN: [id number]  HPI 61 yo F presents for f/u to discuss the following:  1. HTN: compliant with medications. Has difficulty with "pronoucing" name of medications. Denies HA, blurred vision, CP, SOB and LE edema. Admits to some dizziness with standing too fast and when walking long distances.  Compliant with low salt diet. Has smoked only 3 cigarettes since stroke on 11/20/11.   2. DM: taking metformin. Denies polyuria, polydipsia and GI upset.   3. Excessive crying: crying 2-3 times per day. Denies depression. Has history of mood lability requiring SSRI in the past, celexa 20 mg daily. Denies homicidal and suicidal ideation. Denies abuse at home. Admits to her husband drinking excessively. Is adamant that he is not verbally or physically abusive.   Review of Systems As per HPI     Objective:   Physical Exam BP 204/90  Pulse 97  Temp 98 F (36.7 C) (Oral)  Ht 5\' 5"  (1.651 m)  Wt 148 lb (67.132 kg)  BMI 24.63 kg/m2 General appearance: alert, cooperative, mild distress and crying intermittently during exam.  Lungs: clear to auscultation bilaterally Heart: regular rate and rhythm, S1, S2 normal, no murmur, click, rub or gallop Extremities: extremities normal, atraumatic, no cyanosis or edema Neurologic: Grossly normal, gait normal. Patient reports residual L side numbness( face and body).  PHQ-9:  Score 6. 0 to questions 1,6,7,8 and 9. 1 to question 2.  2 to question 4.  3 to question 3.  Not difficult to function.     Assessment and Plan:

## 2011-12-11 NOTE — Assessment & Plan Note (Addendum)
A: still high. P: Continue HCTZ 25 increase lisinopril to 20 mg daily  Continue hydralazine Start amlodipine 5 mg daily  Smoking cessation   F/u in 1 week BP check

## 2011-12-11 NOTE — Assessment & Plan Note (Addendum)
A: residual L sided numbness with mood lability. Negative depression screen.  P: restart celexa 20 mg daily.

## 2011-12-11 NOTE — Patient Instructions (Signed)
Aspasia,  Thank you for coming in today.  For High Blood pressure:  1. hydrochlorothiazide 25 mg daily  2. lisinopril 20 mg daily  3. Hydralazine 10 mg three times daily (every 8 hours) 4. Amlodipine 5 mg daily- NEW  Do not smoke. Continue to limit salt.   For mood: 1. celexa 20 mg daily.   F/u in 1 week.   Dr. Armen Pickup

## 2011-12-18 ENCOUNTER — Encounter: Payer: Self-pay | Admitting: Family Medicine

## 2011-12-18 ENCOUNTER — Ambulatory Visit (INDEPENDENT_AMBULATORY_CARE_PROVIDER_SITE_OTHER): Payer: Self-pay | Admitting: Family Medicine

## 2011-12-18 VITALS — BP 137/83 | HR 106 | Temp 98.6°F | Ht 65.0 in | Wt 145.0 lb

## 2011-12-18 DIAGNOSIS — I10 Essential (primary) hypertension: Secondary | ICD-10-CM

## 2011-12-18 DIAGNOSIS — E119 Type 2 diabetes mellitus without complications: Secondary | ICD-10-CM

## 2011-12-18 LAB — BASIC METABOLIC PANEL
CO2: 24 mEq/L (ref 19–32)
Calcium: 9.8 mg/dL (ref 8.4–10.5)
Chloride: 101 mEq/L (ref 96–112)
Creat: 1.4 mg/dL — ABNORMAL HIGH (ref 0.50–1.10)
Glucose, Bld: 120 mg/dL — ABNORMAL HIGH (ref 70–99)
Sodium: 137 mEq/L (ref 135–145)

## 2011-12-18 MED ORDER — AMLODIPINE BESYLATE 5 MG PO TABS: 2.5000 mg | ORAL_TABLET | Freq: Every day | ORAL | Status: DC

## 2011-12-18 NOTE — Patient Instructions (Addendum)
Lisa [name],  Thank you for coming in to see me today.   Please decreased your Norvasc (amlodipine) to 1/2 tab daily for next two weeks. Then increase to whole tab.  I will check your electrolytes and urine today to make sure you are not dehydrated. My office will call with results.   Also wait 10-15 seconds when getting up from lying down.   F/u with me in two weeks.

## 2011-12-18 NOTE — Progress Notes (Signed)
Subjective:     Patient ID: Lisa [name], female   DOB: 1950-05-09, 61 y.o.   MRN: [id number]  HPI 61 yo F presents for f/u visit to address the following:  1. HTN: compliant with medications. Feeling dizzy upon standing since starting norvasc. Denies syncope, CP, palpations and falls.   2. DM: feels thirsty often. Taking metformin. Does not check CBGs. No GI upset.   Review of Systems As  Per HPI     Objective:   Physical Exam BP 137/83  Pulse 106  Temp 98.6 F (37 C) (Oral)  Ht 5\' 5"  (1.651 m)  Wt 145 lb (65.772 kg)  BMI 24.13 kg/m2 Orthostatic Blood Pressure: Blood pressure:   lying 154/84, sitting 133/83, standing 137/83 Pulse:   lying 105, sitting 106, standing 106  General appearance: alert, cooperative and no distress Eyes: conjunctivae/corneas clear. PERRL, EOM's intact.  Lungs: clear to auscultation bilaterally Heart: regular rate and rhythm, S1, S2 normal, no murmur, click, rub or gallop Extremities: extremities normal, atraumatic, no cyanosis or edema and Homans sign is negative, no sign of DVT     Assessment and Plan:

## 2011-12-19 NOTE — Assessment & Plan Note (Signed)
A: compliant with medications. Non fasting CBG < 200.  Med: metformin, Cr 1.4  P: Continue metformin as long as Cr 1.4 or below.

## 2011-12-19 NOTE — Assessment & Plan Note (Signed)
A: at goal. Patient orthostatic when changing from lying to sitting. Elevated Cr since last checked. Since last check patient started on ACEi and metformin. P:  -decrease norvasc to 2.5 mg daily -d/c ACEi for now. May restart in the future at low dose with close monitoring of Cr after Cr returns to baseline.  -f/u BMP in 2-3 weeks

## 2011-12-30 ENCOUNTER — Encounter: Payer: Self-pay | Admitting: Family Medicine

## 2011-12-30 ENCOUNTER — Ambulatory Visit (INDEPENDENT_AMBULATORY_CARE_PROVIDER_SITE_OTHER): Payer: Self-pay | Admitting: Family Medicine

## 2011-12-30 VITALS — BP 180/85 | Temp 98.1°F | Ht 65.0 in | Wt 147.0 lb

## 2011-12-30 DIAGNOSIS — I10 Essential (primary) hypertension: Secondary | ICD-10-CM

## 2011-12-30 DIAGNOSIS — F17200 Nicotine dependence, unspecified, uncomplicated: Secondary | ICD-10-CM

## 2011-12-30 DIAGNOSIS — Z720 Tobacco use: Secondary | ICD-10-CM

## 2011-12-30 MED ORDER — HYDRALAZINE HCL 10 MG PO TABS: 10.0000 mg | ORAL_TABLET | Freq: Three times a day (TID) | ORAL | Status: DC

## 2011-12-30 MED ORDER — ATORVASTATIN CALCIUM 20 MG PO TABS: 20.0000 mg | ORAL_TABLET | Freq: Every day | ORAL | Status: DC

## 2011-12-30 MED ORDER — AMLODIPINE BESYLATE 5 MG PO TABS: 5.0000 mg | ORAL_TABLET | Freq: Every day | ORAL | Status: DC

## 2011-12-30 MED ORDER — METFORMIN HCL 500 MG PO TABS: 500.0000 mg | ORAL_TABLET | Freq: Two times a day (BID) | ORAL | Status: DC

## 2011-12-30 NOTE — Assessment & Plan Note (Signed)
A: improved. Quit 10 days ago. P: Congratulate patient regarding smoking cessation Provided support # for quit hotline and cessation classes at [facility].

## 2011-12-30 NOTE — Patient Instructions (Addendum)
Ruther,  Thank you for coming in to see me today.  HTN Go back to taking one whole 5 mg Norvasc. Continue to hold lisinopril until further notice.   Smoking cessation: I am so happy that you quit! Continue not to smoke if need support please call the quit hot line. Smoking cessation support: smoking cessation hotline: 1-800-QUIT-NOW.  Here is the number to the smoking cessation classes at [facility]: [phone]  Elevated Cr: no lisinopril. Check Cr today.  F/u in 1 month Dr. Armen Pickup

## 2011-12-30 NOTE — Assessment & Plan Note (Addendum)
A: declined. Elevated BP after removing ACEi and halving Norvasc. P: -increase norvasc to 5 mg daily. Cautioned patient to be careful upon standing (change positions slowly) -check Cr today.  -restart ACEi once creatinine normalizes and follow closely.  -f/u in 1 month.

## 2011-12-30 NOTE — Progress Notes (Signed)
Subjective:     Patient ID: Lisa [name], female   DOB: December 06, 1950, 61 y.o.   MRN: [id number]  HPI 61 yo F presents for f/u to discuss the following:  #HTN: compliant with medications. She has stopped lisinopril per my instructions due to elevated Cr. She is taking norvasc 2.5 mg daily. Her dizziness/lightheadedness upon standing has resolved. She denies HA, chest pain, SOB and LE edema.   #smoking cessation: patient has quit smoking. Last cigarette 10 days ago.  Review of Systems As per HPI     Objective:   Physical Exam BP 178/88  Temp 98.1 F (36.7 C) (Oral)  Ht 5\' 5"  (1.651 m)  Wt 147 lb (66.679 kg)  BMI 24.46 kg/m2 Repeat manual BP after resting: 180/85 General appearance: alert, cooperative and no distress Lungs: clear to auscultation bilaterally Heart: regular rate and rhythm, S1, S2 normal, no murmur, click, rub or gallop Extremities: extremities normal, atraumatic, no cyanosis or edema    Assessment and Plan:

## 2011-12-31 LAB — BASIC METABOLIC PANEL
BUN: 13 mg/dL (ref 6–23)
CO2: 28 mEq/L (ref 19–32)
Calcium: 9.6 mg/dL (ref 8.4–10.5)
Chloride: 100 mEq/L (ref 96–112)
Creat: 1.09 mg/dL (ref 0.50–1.10)

## 2012-01-29 ENCOUNTER — Ambulatory Visit: Payer: Self-pay | Admitting: Family Medicine

## 2012-02-03 ENCOUNTER — Encounter: Payer: Self-pay | Admitting: Family Medicine

## 2012-02-03 ENCOUNTER — Ambulatory Visit (INDEPENDENT_AMBULATORY_CARE_PROVIDER_SITE_OTHER): Payer: Self-pay | Admitting: Family Medicine

## 2012-02-03 VITALS — BP 146/81 | HR 85 | Temp 98.1°F | Ht 65.0 in | Wt 141.9 lb

## 2012-02-03 DIAGNOSIS — F329 Major depressive disorder, single episode, unspecified: Secondary | ICD-10-CM

## 2012-02-03 DIAGNOSIS — I10 Essential (primary) hypertension: Secondary | ICD-10-CM

## 2012-02-03 DIAGNOSIS — E785 Hyperlipidemia, unspecified: Secondary | ICD-10-CM

## 2012-02-03 LAB — LDL CHOLESTEROL, DIRECT: Direct LDL: 78 mg/dL

## 2012-02-03 MED ORDER — HYDROCHLOROTHIAZIDE 25 MG PO TABS: 25.0000 mg | ORAL_TABLET | Freq: Every day | ORAL | Status: DC

## 2012-02-03 MED ORDER — PRAVASTATIN SODIUM 40 MG PO TABS: 40.0000 mg | ORAL_TABLET | Freq: Every day | ORAL | Status: DC

## 2012-02-03 MED ORDER — CITALOPRAM HYDROBROMIDE 20 MG PO TABS: 20.0000 mg | ORAL_TABLET | Freq: Every day | ORAL | Status: DC

## 2012-02-03 NOTE — Progress Notes (Signed)
Subjective:     Patient ID: Lisa [name], female   DOB: 1950-09-05, 61 y.o.   MRN: [id number]  HPI  Mrs. Howeth presents today for a 1 month f/u for HTN. She has past medical hx of HTN, HLD, Type 2 DM, recent CVA (12/26/11) w/ residual L-side weakness.  Today she reports to be "doing well" and has no specific complaints. She has a hx high BP >180 in the past, and today BP was 146/81. She described many lifestyle modifications that she has made within the past month including virtually quitting smoking (2 cigarettes in past 2 weeks), increased walking 2 - 3 miles a day, increased intake of fruits/vegetables, decreased sugar intake.  She states that it has been >2 years since she has last seen an eye doctor. Currently does not have insurance.   Review of Systems  Denies any fever, chills, fatigue, CP, SoB, HA, dizziness, visual changes, numbness/tingling, N/V/C/D. +L-side weakness.     Objective:   Physical Exam  BP 146/81  Pulse 85  Temp 98.1 F (36.7 C) (Oral)  Ht 5\' 5"  (1.651 m)  Wt 141 lb 14.4 oz (64.365 kg)  BMI 23.61 kg/m2 General appearance: alert, cooperative and no distress Head: Normocephalic, without obvious abnormality, atraumatic Eyes: conjunctivae/corneas clear. PERRL, EOM's intact. Fundi benign. Lungs: CTAB, +scattered inspiratory wheezes, improved w/ cough. Heart: regular rate and rhythm, S1, S2 normal, no murmur, click, rub or gallop Extremities: extremities normal, atraumatic, no cyanosis or edema Pulses: 2+ and symmetric Skin: Skin color, texture, turgor normal. No rashes or lesions Neurologic: AAOx3, CN II-XII grossly intact, +L-sided residual facial droop, +L-arm weakness 4/5 muscle str vs. R-side 5/5, intact sensation b/l      Assessment & Plan:     1. HTN - improved. 146/81 today, recent hx of systolic >180 - continue lifestyle changes. Encouraged increased walking, dietary changes - continue home BP meds  2. HLD - Pt implemented lifestyle  modifications, since last visit - switch Atorvastatin 20mg  to Pravastatin 40mg  due to increased cost of Atorvastatin  I examined the patient with Student  Dr. Malachi Paradise. I have reviewed the note and agree with above.   Breifly,  61 yo F with hx of stroke and difficult to control HTN presents for HTN f/u.   # HTN: compliant with medications. No complaints. Smoked 2 cigarettes in past week.  #HLD: complaint with statin. Cost high, $57/month. Denies myalgias.   BP 146/81  Pulse 85  Temp 98.1 F (36.7 C) (Oral)  Ht 5\' 5"  (1.651 m)  Wt 141 lb 14.4 oz (64.365 kg)  BMI 23.61 kg/m2 General appearance: alert and cooperative Lungs: clear to auscultation bilaterally Heart: regular rate and rhythm, S1, S2 normal, no murmur, click, rub or gallop  FUNCHES,JOSALYN 02/04/12, 8:21 PM

## 2012-02-03 NOTE — Patient Instructions (Addendum)
Mrs. Mayerhofer,  Thank you for coming in to see me today.   For your BP: Excellent BP! Continue all medications as prescribed.  Continue to smoke a little as possible with goal of quitting. Continue exercise.   Changed from lipitor to pravastatin 40 mg daily ($4 for 30 day supply).  Will check you direct LDL today.   See me in 6 weeks for pap smear/health maintenance exam.  See me sooner if needed.   Dr. Armen Pickup

## 2012-02-04 NOTE — Assessment & Plan Note (Signed)
Check direct LDL goal < 70 post stroke. Change statin to pravastatin.

## 2012-02-04 NOTE — Assessment & Plan Note (Signed)
A: BP at goal. Compliant and tolerating meds.  P: Continue current regimen. Encourage smoking cessation.

## 2012-02-23 ENCOUNTER — Encounter: Payer: Self-pay | Admitting: Home Health Services

## 2012-02-25 ENCOUNTER — Encounter: Payer: Self-pay | Admitting: Home Health Services

## 2012-03-01 ENCOUNTER — Ambulatory Visit (INDEPENDENT_AMBULATORY_CARE_PROVIDER_SITE_OTHER): Payer: Self-pay | Admitting: Family Medicine

## 2012-03-01 ENCOUNTER — Encounter: Payer: Self-pay | Admitting: Family Medicine

## 2012-03-01 VITALS — BP 152/84 | HR 95 | Ht 65.0 in | Wt 142.0 lb

## 2012-03-01 DIAGNOSIS — I10 Essential (primary) hypertension: Secondary | ICD-10-CM

## 2012-03-01 DIAGNOSIS — IMO0002 Reserved for concepts with insufficient information to code with codable children: Secondary | ICD-10-CM

## 2012-03-01 DIAGNOSIS — E1165 Type 2 diabetes mellitus with hyperglycemia: Secondary | ICD-10-CM

## 2012-03-01 LAB — POCT GLYCOSYLATED HEMOGLOBIN (HGB A1C): Hemoglobin A1C: 7

## 2012-03-01 NOTE — Assessment & Plan Note (Signed)
A: elevated BP off hydralazine. P: Restart hydralazine.  F/u in one month.

## 2012-03-01 NOTE — Patient Instructions (Addendum)
Lisa [name],  Thank you for coming in to see me today.   You blood pressure is elevated.  Please restart hydralazine and aspirin (you can get the generic brand).  F/u in one month  Dr. Armen Pickup

## 2012-03-01 NOTE — Assessment & Plan Note (Signed)
A: improved with metformin. A1c down from 9.2 to 7.  P: continue metformin.

## 2012-03-01 NOTE — Progress Notes (Signed)
Subjective:     Patient ID: Lisa [name], female   DOB: Dec 18, 1950, 61 y.o.   MRN: [id number]  HPI 61 yo F presents for f/u visit to discuss the following:  1. HTN: ran out of hydralazine 1 week ago. Compliant with all other medications. Denies HA, CP, SOB and LE edema.   2. DM: taking metformin. Denies GI upset. Polyuria and polydipsia.   3. L lumbar back pain: intermittent, sharp pain, non-radiating.   Social history update: husband recently diagnosed with malignant cancer. He was hospitalized but is now at home on comfort care.   Review of Systems As per HPI     Objective:   Physical Exam BP 152/84  Pulse 95  Ht 5\' 5"  (1.651 m)  Wt 142 lb (64.411 kg)  BMI 23.63 kg/m2 General appearance: alert, cooperative and no distress Back: symmetric, no curvature. ROM normal. No CVA tenderness. L lumbar paraspinal trigger point. Relaxed with massage.  Lungs: clear to auscultation bilaterally Heart: S1S2, no MRG.  Extremities: extremities normal, atraumatic, no cyanosis or edema     Assessment and Plan:

## 2012-03-01 NOTE — Progress Notes (Signed)
Subjective:     Patient ID: Lisa [name], female   DOB: 1950/05/13, 60 y.o.   MRN: [id number]  Diabetes  Hypertension   61 yo F presents for f/u visit to discuss the following:  1. HTN: ran out of hydralazine 1 week ago. Compliant with all other medications. Denies HA, CP, SOB and LE edema.   2. DM: taking metformin. Denies GI upset. Polyuria and polydipsia.   3. L lumbar back pain: intermittent, sharp pain, non-radiating.   Social history update: husband recently diagnosed with malignant cancer. He was hospitalized but is now at home on comfort care.   Review of Systems As per HPI     Objective:   Physical Exam BP 152/84  Pulse 95  Ht 5\' 5"  (1.651 m)  Wt 142 lb (64.411 kg)  BMI 23.63 kg/m2 General appearance: alert, cooperative and no distress Back: symmetric, no curvature. ROM normal. No CVA tenderness. L lumbar paraspinal trigger point. Relaxed with massage.  Lungs: clear to auscultation bilaterally Heart: S1S2, no MRG.  Extremities: extremities normal, atraumatic, no cyanosis or edema     Assessment and Plan:

## 2012-03-31 ENCOUNTER — Ambulatory Visit (INDEPENDENT_AMBULATORY_CARE_PROVIDER_SITE_OTHER): Payer: Self-pay | Admitting: Family Medicine

## 2012-03-31 ENCOUNTER — Encounter: Payer: Self-pay | Admitting: Family Medicine

## 2012-03-31 VITALS — BP 196/98 | HR 89 | Temp 98.1°F | Ht 65.0 in | Wt 145.0 lb

## 2012-03-31 DIAGNOSIS — I10 Essential (primary) hypertension: Secondary | ICD-10-CM

## 2012-03-31 DIAGNOSIS — E1165 Type 2 diabetes mellitus with hyperglycemia: Secondary | ICD-10-CM

## 2012-03-31 NOTE — Assessment & Plan Note (Signed)
A: declined due to med non compliance P: Restart HCTZ and hydralazine. D/C norvasc, no need for 2nd CCB.  F/u in one month.

## 2012-03-31 NOTE — Progress Notes (Signed)
Subjective:     Patient ID: Lisa [name], female   DOB: April 18, 1950, 61 y.o.   MRN: [id number]  HPI 61 yo F presents with her niece for f/u visit for the following:  1. HTN: not taking medications due to cost. Admits to SOB with coughing. Denies visual changes,  HA, CP, LE edema, and weakness. Smoking 2 cigarettes per month.   2. DM2: not taking metformin.   Review of Systems As per HPI    Objective:   Physical Exam BP 196/98  Pulse 89  Temp 98.1 F (36.7 C) (Oral)  Ht 5\' 5"  (1.651 m)  Wt 145 lb (65.772 kg)  BMI 24.13 kg/m2 General appearance: alert, cooperative and no distress Lungs: clear to auscultation bilaterally Heart: regular rate and rhythm, S1, S2 normal, no murmur, click, rub or gallop Extremities: extremities normal, atraumatic, no cyanosis or edema     Assessment and Plan:

## 2012-03-31 NOTE — Patient Instructions (Addendum)
Mrs. Rotundo,  Thank you for coming in today.  Please restart your BP medications as soon as possible.   F/u in one month.  Dr. Armen Pickup

## 2012-03-31 NOTE — Assessment & Plan Note (Signed)
Restart metformin

## 2012-04-02 ENCOUNTER — Telehealth: Payer: Self-pay | Admitting: Family Medicine

## 2012-04-02 NOTE — Telephone Encounter (Signed)
Called pharmacy and RX are ready to pick up. Message left on voicemail.

## 2012-04-02 NOTE — Telephone Encounter (Signed)
Pt was supposed to have some meds called in for her when she was here the other day. Pharm doesn't have them yet.

## 2012-04-30 ENCOUNTER — Ambulatory Visit (HOSPITAL_COMMUNITY)
Admission: RE | Admit: 2012-04-30 | Discharge: 2012-04-30 | Disposition: A | Discharge status: Home / Self Care | Payer: Self-pay | Source: Ambulatory Visit | Attending: Family Medicine | Admitting: Family Medicine

## 2012-04-30 ENCOUNTER — Ambulatory Visit (INDEPENDENT_AMBULATORY_CARE_PROVIDER_SITE_OTHER): Payer: Self-pay | Admitting: Family Medicine

## 2012-04-30 ENCOUNTER — Encounter: Payer: Self-pay | Admitting: Family Medicine

## 2012-04-30 VITALS — BP 160/96 | HR 91 | Temp 97.9°F | Ht 65.0 in | Wt 150.0 lb

## 2012-04-30 DIAGNOSIS — R012 Other cardiac sounds: Secondary | ICD-10-CM

## 2012-04-30 DIAGNOSIS — I499 Cardiac arrhythmia, unspecified: Secondary | ICD-10-CM | POA: Insufficient documentation

## 2012-04-30 DIAGNOSIS — I10 Essential (primary) hypertension: Secondary | ICD-10-CM

## 2012-04-30 DIAGNOSIS — F4321 Adjustment disorder with depressed mood: Secondary | ICD-10-CM

## 2012-04-30 MED ORDER — HYDRALAZINE HCL 25 MG PO TABS: 25.0000 mg | ORAL_TABLET | Freq: Three times a day (TID) | ORAL | Status: DC

## 2012-04-30 NOTE — Progress Notes (Addendum)
Subjective:     Patient ID: SIMRAN BOMKAMP, female   DOB: 11-Jan-1951, 62 y.o.   MRN: [id number]  HPI 62 yo F presents for f/u to discuss the following:  1. HTN: she is compliant with all medications except her statin at this time. She denies HA, CP, SOB and leg swelling.   2. Grief: her husband passed away on 04-16-2012. He was an alcoholic. Had stage 4 cancer. They were married for 42 years. She reports regular appetite. She sleeps for 30 minutes at at with frequent awakenings.   Review of Systems As per HPI    Objective:   Physical Exam BP 160/96  Pulse 91  Temp 97.9 F (36.6 C) (Oral)  Ht 5\' 5"  (1.651 m)  Wt 150 lb (68.04 kg)  BMI 24.96 kg/m2 General appearance: alert, cooperative and no distress Lungs: clear to auscultation bilaterally Heart: regular rate and rhythm and S4 present Extremities: extremities normal, atraumatic, no cyanosis or edema Neurologic: Grossly normal  EKG obtained to evaluate extra heart sound: NSR, no ST elevation or depression, mild LVH.     Assessment and Plan:

## 2012-04-30 NOTE — Patient Instructions (Addendum)
Mrs. Silveira,  Thank you for coming in to see me today.   I am sorry to hear about your loss.   Your EKG was reassuring.   For you BP: Please increase hydralazine to 25 mg three times daily- new Rx sent to your pharmacy.   For prevention of stroke: Restart your pravastatin.   F/u in 2 weeks.  Dr. Armen Pickup

## 2012-05-02 ENCOUNTER — Encounter: Payer: Self-pay | Admitting: Family Medicine

## 2012-05-02 DIAGNOSIS — R012 Other cardiac sounds: Secondary | ICD-10-CM | POA: Insufficient documentation

## 2012-05-02 DIAGNOSIS — F4321 Adjustment disorder with depressed mood: Secondary | ICD-10-CM | POA: Insufficient documentation

## 2012-05-02 NOTE — Assessment & Plan Note (Signed)
A: from death of her husband P: offered bereavement resources through palliative care. Patient declined.

## 2012-05-02 NOTE — Assessment & Plan Note (Signed)
A:  Elevated above goal.  P:  Continue HCTZ 25 mg daily Increase hydralazine to 25 mg TID from 10 mg TID.

## 2012-05-28 ENCOUNTER — Ambulatory Visit: Payer: Self-pay | Admitting: Family Medicine

## 2012-06-25 ENCOUNTER — Ambulatory Visit (INDEPENDENT_AMBULATORY_CARE_PROVIDER_SITE_OTHER): Payer: Self-pay | Admitting: Family Medicine

## 2012-06-25 ENCOUNTER — Encounter: Payer: Self-pay | Admitting: Family Medicine

## 2012-06-25 VITALS — BP 170/77 | HR 87 | Temp 98.7°F | Ht 65.0 in | Wt 144.0 lb

## 2012-06-25 DIAGNOSIS — R209 Unspecified disturbances of skin sensation: Secondary | ICD-10-CM

## 2012-06-25 DIAGNOSIS — M545 Low back pain: Secondary | ICD-10-CM

## 2012-06-25 DIAGNOSIS — R202 Paresthesia of skin: Secondary | ICD-10-CM

## 2012-06-25 DIAGNOSIS — F329 Major depressive disorder, single episode, unspecified: Secondary | ICD-10-CM

## 2012-06-25 MED ORDER — HYDRALAZINE HCL 10 MG PO TABS: 10.0000 mg | ORAL_TABLET | Freq: Three times a day (TID) | ORAL | Status: DC

## 2012-06-25 MED ORDER — CITALOPRAM HYDROBROMIDE 20 MG PO TABS: 20.0000 mg | ORAL_TABLET | Freq: Every day | ORAL | Status: DC

## 2012-06-25 MED ORDER — METFORMIN HCL 500 MG PO TABS: 500.0000 mg | ORAL_TABLET | Freq: Two times a day (BID) | ORAL | Status: DC

## 2012-06-25 MED ORDER — HYDROCHLOROTHIAZIDE 25 MG PO TABS: 25.0000 mg | ORAL_TABLET | Freq: Every day | ORAL | Status: DC

## 2012-06-25 MED ORDER — PRAVASTATIN SODIUM 40 MG PO TABS: 40.0000 mg | ORAL_TABLET | Freq: Every day | ORAL | Status: DC

## 2012-06-25 NOTE — Patient Instructions (Addendum)
Shan,   Thank you very much for coming in today.   Based on your exam, it does not appear that you had another stroke.  Continue to take aspirin daily.  Restart BP medication: HCTZ 25 mg daily is $4/month Hydralazine starting at 10 mg three times daily is also $4/month.   Smoking cessation support: smoking cessation hotline: 1-800-QUIT-NOW.  Here is the number to the smoking cessation classes at [facility]: [phone]  Please f/u with me in two weeks. I will re-evaluate your R arm pain and back pain at that time and discuss treatment options.   Dr. Armen Pickup

## 2012-06-26 DIAGNOSIS — R200 Anesthesia of skin: Secondary | ICD-10-CM | POA: Insufficient documentation

## 2012-06-26 NOTE — Assessment & Plan Note (Signed)
A: started two weeks ago suspect cervical radiculopathy as numbness is associated with radiating pain. Considered recurrent stroke but there are not other motor deficits.  P: May start gabapentin if symptoms increase or persist For now patient is willing to delay treatment and focus chronic disease management.

## 2012-06-26 NOTE — Assessment & Plan Note (Signed)
A: declined due to medication non-compliance.  P: Restart HCTZ and hydralazine F/u in two weeks.  Stressed the importance of controlling BP to prevent recurrent stroke Smoking cessation.

## 2012-06-26 NOTE — Progress Notes (Signed)
Subjective:     Patient ID: Lisa [name], female   DOB: 01/21/1951, 62 y.o.   MRN: [id number]  HPI 62 yo F presents with her sister to discuss the following:  1. HTN: out of medication x 2 months. Taking ASA only. Denies HA, CP, SOB and LE edema. Admits to new onset R finger numbness. She is smoking again < 1 cigarette per day.   2. R finger numbness: palmar surface of middle 3 R fingers x 2 weeks. Woke up one day with numbness. Has been unchanged. Also has pain radiating down the medial surface of the R arm to fingers. Denies injury. Pain is achy and sharp. Pain is worse at night. Denies neck and shuoulder pain. Denies hand weakness.   3. L low back pain: L low back pain. Achy, non-radiating, worse at night. Had similar pain previously which she was told was a pinched nerve. Denies fever, chills, weight loss, LE weakness, groin numbness and fecal/urinary incontinence/retention.   4. Medication non-compliance: due to cost. Also death of husband has caused a noticeable financial and emotional strain and patient. She reports that she will be able to afford her BP meds today. She states her son will buy her other meds soon. She reports that she has not yet meet with Abundio Miu regarding the orange cars b/c of all her other concerns but she will do so soon.   Review of Systems As per HPI    Objective:   Physical Exam BP 170/77  Pulse 87  Temp(Src) 98.7 F (37.1 C)  Ht 5\' 5"  (1.651 m)  Wt 144 lb (65.318 kg)  BMI 23.96 kg/m2 General appearance: alert, cooperative and no distress Eyes: conjunctivae/corneas clear. PERRL, EOM's intact. Fundi benign. Neck: Full ROM, normal curvature, non tender, negative Spurling's  Lungs: clear to auscultation bilaterally Heart: regular rate and rhythm, S1, S2 normal, no murmur, click, rub or gallop Neurologic: Alert and oriented X 3, CN 2-12 intact, normal strength and tone. Decreased sensation plantar surface of middle 3 fingers.  Normal symmetric  reflexes. Normal coordination and gait normal, mild difficulty with tandem gait.   Back: normal inspection. Non tender. Full ROM. Negative straight leg raising test, negative FABER. 5/5 LE at hip, knee and foot. 2+ reflexes PT and Achilles b/l. Neutral Babinski b/l.     Assessment and Plan:

## 2012-06-26 NOTE — Assessment & Plan Note (Signed)
A: recurrent. Suspect MSK pain. No radicular symptoms on hx or exam to suggest nerve impingement or ruptured/protruding disc. No red flags on hx to suggest spinal cord lesion.  P:  Consider NSAID after BP under control ICE is a good option for now.

## 2012-06-30 ENCOUNTER — Encounter: Payer: Self-pay | Admitting: *Deleted

## 2012-07-09 ENCOUNTER — Ambulatory Visit: Payer: Self-pay | Admitting: Family Medicine

## 2012-08-10 ENCOUNTER — Encounter: Payer: Self-pay | Admitting: Family Medicine

## 2012-08-10 ENCOUNTER — Ambulatory Visit (INDEPENDENT_AMBULATORY_CARE_PROVIDER_SITE_OTHER): Payer: PRIVATE HEALTH INSURANCE | Admitting: Family Medicine

## 2012-08-10 VITALS — BP 178/90 | HR 84 | Temp 98.7°F | Ht 65.0 in | Wt 147.0 lb

## 2012-08-10 DIAGNOSIS — I10 Essential (primary) hypertension: Secondary | ICD-10-CM

## 2012-08-10 DIAGNOSIS — E1165 Type 2 diabetes mellitus with hyperglycemia: Secondary | ICD-10-CM

## 2012-08-10 DIAGNOSIS — J309 Allergic rhinitis, unspecified: Secondary | ICD-10-CM

## 2012-08-10 MED ORDER — FLUTICASONE PROPIONATE 50 MCG/ACT NA SUSP: 2.0000 | Freq: Every day | NASAL | Status: DC

## 2012-08-10 MED ORDER — SALINE NASAL SPRAY 0.65 % NA SOLN: 1.0000 | NASAL | Status: DC | PRN

## 2012-08-10 NOTE — Patient Instructions (Addendum)
Liah,  Thank you for coming in today.   Please pick and and restart your BP medication as soon as possible.  For your runny nose and congestion. I suspect allergies. Start nasal saline. Use throughout the day. Try tylenol for pains. Start flonase if congestion persist in spite of nasal saline.   Please call MAP for medication assistance. If you are approved, let me know and I will send your meds there.   Dr. Armen Pickup

## 2012-08-10 NOTE — Assessment & Plan Note (Signed)
A: declined. Meds: non compliant due to cost.  P: Patient has orange card now. Provided handout for MAPs. Patient says she will have money on Thursday to pick up medications.  No med changes. F/u in 3 weeks referral to health coach.

## 2012-08-10 NOTE — Assessment & Plan Note (Signed)
A: symptoms consistent with allergic rhinitis vs URI  P: Nasal saline flonase if nasal saline fails to yield improvement.

## 2012-08-10 NOTE — Progress Notes (Signed)
Subjective:     Patient ID: Lisa [name], female   DOB: 04/17/1950, 62 y.o.   MRN: [id number]  HPI 62 yo F presents for f/u appt to discuss the following:  1. HTN: no medications for past two weeks due to lack of funds. Only taking ASA. Denies HA, CP, vision changes, SOB.   2. Congestion: x one week. Associated with sneezing, sore throat and minimal dry cough. No fever or sick contacts.   Review of Systems As per HPI     Objective:   Physical Exam BP 178/90  Pulse 84  Temp(Src) 98.7 F (37.1 C) (Oral)  Ht 5\' 5"  (1.651 m)  Wt 147 lb (66.679 kg)  BMI 24.46 kg/m2 General appearance: alert, cooperative and no distress Ears: normal TM's and external ear canals both ears Nose: mucoid discharge, turbinates red, swollen, no sinus tenderness Throat: lips, mucosa, and tongue normal; teeth and gums normal Lungs: clear to auscultation bilaterally Heart: regular rate and rhythm, S1, S2 normal, no murmur, click, rub or gallop    Assessment and Plan:

## 2012-08-12 ENCOUNTER — Other Ambulatory Visit: Payer: Self-pay | Admitting: Family Medicine

## 2012-08-13 ENCOUNTER — Telehealth: Payer: Self-pay | Admitting: *Deleted

## 2012-08-13 NOTE — Telephone Encounter (Signed)
Pt extremely upset, " I need help getting my medicines". Pt given number for MAP program and told to call with further questions. Was calm and collected at the end of the phone call. [facility] Tavenner, Harold Hedge, RN

## 2012-08-31 ENCOUNTER — Ambulatory Visit (INDEPENDENT_AMBULATORY_CARE_PROVIDER_SITE_OTHER): Payer: PRIVATE HEALTH INSURANCE | Admitting: Family Medicine

## 2012-08-31 ENCOUNTER — Encounter: Payer: Self-pay | Admitting: Family Medicine

## 2012-08-31 VITALS — BP 175/83 | HR 87 | Ht 65.0 in | Wt 145.0 lb

## 2012-08-31 DIAGNOSIS — E785 Hyperlipidemia, unspecified: Secondary | ICD-10-CM

## 2012-08-31 DIAGNOSIS — I10 Essential (primary) hypertension: Secondary | ICD-10-CM

## 2012-08-31 DIAGNOSIS — Z8673 Personal history of transient ischemic attack (TIA), and cerebral infarction without residual deficits: Secondary | ICD-10-CM

## 2012-08-31 MED ORDER — LOVASTATIN 20 MG PO TABS: 40.0000 mg | ORAL_TABLET | Freq: Every day | ORAL | Status: DC

## 2012-08-31 MED ORDER — HYDRALAZINE HCL 25 MG PO TABS: 25.0000 mg | ORAL_TABLET | Freq: Three times a day (TID) | ORAL | Status: DC

## 2012-08-31 NOTE — Progress Notes (Signed)
Subjective:     Patient ID: Lisa [name], female   DOB: [birth year], 62 y.o.   MRN: [id number]  Hypertension   62 yo F presents for f/u appt to discuss the following:  1. HTN: compliant with medications. Denies HA, CP, SOB and LE edema. Has not been successful with applying for [facility] MAP benefits. Smoking one cigarette per week.   2. Congestion:started nasal saline and nasacort. Symptoms have resolved.   3. HLD/stroke/DM2: not taking statin due cost or metformin. Taking daily aspirin 325 mg.  Review of Systems As per HPI     Objective:   Physical Exam  BP 175/83  Pulse 87  Ht 5\' 5"  (1.651 m)  Wt 145 lb (65.772 kg)  BMI 24.13 kg/m2 BP Readings from Last 3 Encounters:  08/31/12 175/83  08/10/12 178/90  06/25/12 170/77  General appearance: alert, cooperative and no distress Neck: no lymphadenopathy or enlarged thyroid.  Lungs: clear to auscultation bilaterally Heart: regular rate and rhythm, S1, S2 normal, no murmur, click, rub or gallop    Assessment and Plan:

## 2012-08-31 NOTE — Patient Instructions (Addendum)
Lisa [name],  Thank you for coming in today.  I have increased hydralazine to 25 mg three times daily. I have sent in lovastatin, cholesterol medication that should be on the $4 for the month.  F/u in one month.   Dr. Armen Pickup

## 2012-08-31 NOTE — Assessment & Plan Note (Addendum)
A: persistently high. Patient taking meds P:  Increase hydralazine to 25 mg TID Continue HCTZ 25 mg daily  F/u in one month

## 2012-08-31 NOTE — Assessment & Plan Note (Signed)
A: untreated. Pravastatin too high. P: Change to lovastatin 40 daily (moderate intensity statin, as patient cannot afford high intensity statin).

## 2012-09-02 NOTE — Progress Notes (Signed)
Scheduled HC for BP after PCP visit on 10/06/12.

## 2012-10-06 ENCOUNTER — Ambulatory Visit (INDEPENDENT_AMBULATORY_CARE_PROVIDER_SITE_OTHER): Payer: PRIVATE HEALTH INSURANCE | Admitting: Family Medicine

## 2012-10-06 ENCOUNTER — Ambulatory Visit (INDEPENDENT_AMBULATORY_CARE_PROVIDER_SITE_OTHER): Payer: PRIVATE HEALTH INSURANCE | Admitting: Home Health Services

## 2012-10-06 VITALS — BP 201/98 | HR 71 | Temp 97.4°F | Ht 65.0 in | Wt 148.4 lb

## 2012-10-06 DIAGNOSIS — F329 Major depressive disorder, single episode, unspecified: Secondary | ICD-10-CM

## 2012-10-06 DIAGNOSIS — Z8673 Personal history of transient ischemic attack (TIA), and cerebral infarction without residual deficits: Secondary | ICD-10-CM

## 2012-10-06 DIAGNOSIS — I10 Essential (primary) hypertension: Secondary | ICD-10-CM

## 2012-10-06 DIAGNOSIS — Z720 Tobacco use: Secondary | ICD-10-CM

## 2012-10-06 DIAGNOSIS — F17200 Nicotine dependence, unspecified, uncomplicated: Secondary | ICD-10-CM

## 2012-10-06 MED ORDER — LOVASTATIN 20 MG PO TABS: 40.0000 mg | ORAL_TABLET | Freq: Every day | ORAL | Status: DC

## 2012-10-06 MED ORDER — HYDROCHLOROTHIAZIDE 25 MG PO TABS: ORAL_TABLET | ORAL | Status: DC

## 2012-10-06 MED ORDER — METFORMIN HCL 500 MG PO TABS: 500.0000 mg | ORAL_TABLET | Freq: Two times a day (BID) | ORAL | Status: DC

## 2012-10-06 MED ORDER — CITALOPRAM HYDROBROMIDE 20 MG PO TABS: 20.0000 mg | ORAL_TABLET | Freq: Every day | ORAL | Status: DC

## 2012-10-06 MED ORDER — HYDRALAZINE HCL 25 MG PO TABS: 25.0000 mg | ORAL_TABLET | Freq: Three times a day (TID) | ORAL | Status: DC

## 2012-10-06 NOTE — Assessment & Plan Note (Signed)
Ready to quit. Feels confident that she can quit independently.

## 2012-10-06 NOTE — Progress Notes (Signed)
Subjective:     Patient ID: Lisa [name], female   DOB: October 14, 1950, 62 y.o.   MRN: [id number]  HPI 62 year old female with history of stroke in the setting of chronically uncontrolled hypertension, smoking and diabetes 2 presents for hypertension followup. She has not taken her medication for 2 weeks. She denies headache, vision changes, chest pain, shortness of breath and lower extremity edema. She states that she will now be able to afford her medications.  She currently smokes 2 cigarettes weekly. She's ready to quit.  Meds review: patient is currently taking his aspirin, Nasacort and nasal saline.  Review of Systems As per HPI     Objective:   Physical Exam BP 201/98  Pulse 71  Temp(Src) 97.4 F (36.3 C) (Oral)  Ht 5\' 5"  (1.651 m)  Wt 148 lb 6.4 oz (67.314 kg)  BMI 24.7 kg/m2 General appearance: alert, cooperative and no distress Eyes: conjunctivae/corneas clear. PERRL, EOM's intact.  Lungs: clear to auscultation bilaterally Heart: regular rate and rhythm, S1, S2 normal, no murmur, click, rub or gallop Extremities: extremities normal, atraumatic, no cyanosis or edema    Assessment and Plan:       Restarting all medication. Patient to followup in 2 weeks.

## 2012-10-06 NOTE — Assessment & Plan Note (Signed)
orA: persistently high. Patient not taking medications.  P:  Restart  hydralazine to 25 mg TID Restart  HCTZ 25 mg daily  F/u in two weeks month

## 2012-10-06 NOTE — Progress Notes (Signed)
Patient Identified Concern:  Blood pressure, smoking cessation, weight Stage of Change Patient Is In:  Contemplation plans on making changes within the next 6 months Patient Reported Barriers:  Cost of medications, death of husband- grieving,  Patient Reported Perceived Benefits:  Living a long health life Patient Reports Self-Efficacy:   Pt verbalizes some self-efficacy Behavior Change Supports:  Sister is going to take her to exercise, get medications and help her read food lables. Goals:  Exercise 150 minutes week, instead of smoking- walk, eat fruit, read, read food labels-avoid foods with 10% or more sodium Patient Education:  We talked about factors that influence bp management.  We talked about BMI, exercise, and ways to manage stress without smoking.

## 2012-10-06 NOTE — Patient Instructions (Addendum)
Mrs. Heacock,  Thank you very much for coming in today. Exam is normal today. I am excited that you can restart the medication. I have sent refills to pharmacy.  Please restart medications and followup with me in 2 weeks.  Dr. Armen Pickup

## 2012-10-21 ENCOUNTER — Other Ambulatory Visit: Payer: Self-pay

## 2012-11-10 ENCOUNTER — Telehealth: Payer: Self-pay | Admitting: Home Health Services

## 2012-11-10 NOTE — Telephone Encounter (Signed)
Spoke with [place].  Pt reports taking all medications daily without missing any days.  Pt reports having only smoke 2 cigarettes in the past month.  Pt reports walking both morning and night most days.  Pt reports reading food labels now and try's to avoid food that has more than 10% sodium.  Pt expressed excitement for the recent changes she has made and is really proud of quitting smoking, exercising more, and watching diet.  Pt will schedule fu appointment within the next 3 weeks.  Pt's goal this next week is to take medications, continue with smoking cessation, and dash diet.  Pt's overall goal is BP management.

## 2012-11-23 ENCOUNTER — Ambulatory Visit (INDEPENDENT_AMBULATORY_CARE_PROVIDER_SITE_OTHER): Payer: PRIVATE HEALTH INSURANCE | Admitting: Family Medicine

## 2012-11-23 ENCOUNTER — Encounter: Payer: Self-pay | Admitting: Family Medicine

## 2012-11-23 VITALS — BP 173/93 | HR 107 | Temp 97.9°F | Wt 147.0 lb

## 2012-11-23 DIAGNOSIS — E785 Hyperlipidemia, unspecified: Secondary | ICD-10-CM

## 2012-11-23 DIAGNOSIS — I10 Essential (primary) hypertension: Secondary | ICD-10-CM

## 2012-11-23 DIAGNOSIS — F3289 Other specified depressive episodes: Secondary | ICD-10-CM

## 2012-11-23 DIAGNOSIS — F329 Major depressive disorder, single episode, unspecified: Secondary | ICD-10-CM

## 2012-11-23 DIAGNOSIS — Z1211 Encounter for screening for malignant neoplasm of colon: Secondary | ICD-10-CM

## 2012-11-23 MED ORDER — CITALOPRAM HYDROBROMIDE 20 MG PO TABS: 20.0000 mg | ORAL_TABLET | Freq: Every day | ORAL | Status: DC

## 2012-11-23 MED ORDER — HYDRALAZINE HCL 25 MG PO TABS: 50.0000 mg | ORAL_TABLET | Freq: Three times a day (TID) | ORAL | Status: DC

## 2012-11-23 NOTE — Progress Notes (Signed)
Patient ID: Lisa [name], female   DOB: 01-26-1951, 62 y.o.   MRN: [id number] [name] Lisa Ferretti, MD Phone: [phone]  Subjective:   # HTN -pt with hx of stroke with chronic uncontrolled HTN, smoking and DM2- here for f/up. Reported to me that she had taken her meds this am but had rushed to get here on the bus (BP 173/93 repeat 170/88) Patient admits to compliance with HCTZ and hydalazine daily. Although hx of prev non compliance. Has had difficulty with financial cost of meds in the past.   #Hyperlipidemia/triglyc -last panel 08/07 with elevated triglycerides 466, chol 267, LDL 78. Is currently on mevacor. Was not fasting today  # HCM -is overdue for mammo and colonoscopy -has not had financial ability to obtain   ROS--See HPI  Past Medical History Patient Active Problem List   Diagnosis Date Noted  . Allergic rhinitis 08/10/2012  . Left low back pain 06/26/2012  . Numbness and tingling in right hand 06/26/2012  . S4 (fourth heart sound) 05/02/2012  . Grief reaction 05/02/2012  . DM (diabetes mellitus), type 2, uncontrolled 12/11/2011  . Arterial ischemic stroke, vertebrobasilar, cerebellar, chronic 11/20/2011  . Hypertension 11/20/2011  . Hyperlipidemia 11/20/2011  . Tobacco abuse 11/20/2011   Reviewed problem list.  Medications- reviewed and updated Chief complaint-noted  Objective: BP 173/93  Pulse 107  Temp(Src) 97.9 F (36.6 C) (Oral)  Wt 147 lb (66.679 kg)  BMI 24.46 kg/m2 Gen: NAD, alert, cooperative with exam HEENT: red reflux present bilat, PERRL CV: RRR, good S1/S2, no murmur, cap refill <3 Ext: No edema, warm, normal tone, moves UE/LE spontaneously Neuro: Alert and oriented, asymmetric facies 2/2 stroke  Assessment/Plan:  See problem based #HCM -given info for mammogram -pt given instructions for colon ca screening, ie collection of stool specimens with send in -will see me back here for fasting lipid panel 1  month  F/up 1 month for changes in med management

## 2012-11-23 NOTE — Patient Instructions (Addendum)
Lisa [name] it was great to meet you today! Good job with your diabetes, continue to make those healthy changes as you have been For your blood pressure continue to take HCTZ X1 per day and hydralazide X3 per day but now at 50mg  dose [facility] For monitoring of colon cancer we will have you collect stool specimens on those cards as you have done before Please see me back in 1 month for f/up of your new HTN medications and also for checking your cholesterol. Please make sure you do not eat prior to this visit.  Charlane Ferretti, MD  **Please call Stoney Bang, [phone],  with the [facility] (breast and cervical cancer screening program) at the [facility] to set up an appointment to verify eligibility for a breast exam, mammogram, ultrasound. If you qualify this will be set up at [facility].

## 2012-11-23 NOTE — Assessment & Plan Note (Signed)
A: elevated BPs consistently (here 173/93 --> 170/88), last visit restarted hydralazine 25mg  TID P: will continue HCTZ 25mg  and increase hydralazine to 50mg  TID, see me back in 2wks-1 month for f/up

## 2012-12-31 ENCOUNTER — Ambulatory Visit: Payer: PRIVATE HEALTH INSURANCE | Admitting: Family Medicine

## 2013-01-20 ENCOUNTER — Ambulatory Visit (INDEPENDENT_AMBULATORY_CARE_PROVIDER_SITE_OTHER): Payer: PRIVATE HEALTH INSURANCE | Admitting: Family Medicine

## 2013-01-20 VITALS — BP 220/110 | HR 66 | Temp 97.7°F | Ht 65.0 in | Wt 153.0 lb

## 2013-01-20 DIAGNOSIS — F4321 Adjustment disorder with depressed mood: Secondary | ICD-10-CM

## 2013-01-20 DIAGNOSIS — E1165 Type 2 diabetes mellitus with hyperglycemia: Secondary | ICD-10-CM

## 2013-01-20 DIAGNOSIS — I10 Essential (primary) hypertension: Secondary | ICD-10-CM

## 2013-01-20 DIAGNOSIS — Z23 Encounter for immunization: Secondary | ICD-10-CM

## 2013-01-20 DIAGNOSIS — Z8673 Personal history of transient ischemic attack (TIA), and cerebral infarction without residual deficits: Secondary | ICD-10-CM

## 2013-01-20 LAB — POCT UA - MICROALBUMIN
Albumin/Creatinine Ratio, Urine, POC: <30
Creatinine, POC: 50 mg/dL
Microalbumin Ur, POC: 10 mg/L

## 2013-01-20 LAB — POCT URINALYSIS DIPSTICK
Bilirubin, UA: NEGATIVE
Glucose, UA: NEGATIVE
Ketones, UA: NEGATIVE
Nitrite, UA: NEGATIVE
Protein, UA: NEGATIVE
Spec Grav, UA: 1.015
Urobilinogen, UA: 0.2
pH, UA: 6.5

## 2013-01-20 LAB — POCT UA - MICROSCOPIC ONLY

## 2013-01-20 LAB — COMPREHENSIVE METABOLIC PANEL
ALT: 22 U/L (ref 0–35)
Alkaline Phosphatase: 102 U/L (ref 39–117)
Sodium: 135 mEq/L (ref 135–145)
Total Bilirubin: 0.4 mg/dL (ref 0.3–1.2)
Total Protein: 7 g/dL (ref 6.0–8.3)

## 2013-01-20 LAB — LIPID PANEL
LDL Cholesterol: 103 mg/dL — ABNORMAL HIGH (ref 0–99)
Total CHOL/HDL Ratio: 3.1 Ratio
Triglycerides: 85 mg/dL (ref <150)
VLDL: 17 mg/dL (ref 0–40)

## 2013-01-20 LAB — POCT GLYCOSYLATED HEMOGLOBIN (HGB A1C): Hemoglobin A1C: 6.2

## 2013-01-20 MED ORDER — HYDRALAZINE HCL 50 MG PO TABS: 50.0000 mg | ORAL_TABLET | Freq: Three times a day (TID) | ORAL | Status: DC

## 2013-01-20 MED ORDER — HYDROCHLOROTHIAZIDE 25 MG PO TABS: ORAL_TABLET | ORAL | Status: DC

## 2013-01-20 MED ORDER — HYDRALAZINE HCL 25 MG PO TABS: 50.0000 mg | ORAL_TABLET | Freq: Three times a day (TID) | ORAL | Status: DC

## 2013-01-20 MED ORDER — LOVASTATIN 40 MG PO TABS: 40.0000 mg | ORAL_TABLET | Freq: Every day | ORAL | Status: DC

## 2013-01-20 NOTE — Assessment & Plan Note (Signed)
Patient's obviously depressed. However her screening PT 9 she denies normal with a score of 1.   Plan to restart SSRI.  She maximum benefit more from a tricyclic antidepressant as her major complaint is sleep disturbance.  Will reassess at followup.

## 2013-01-20 NOTE — Patient Instructions (Signed)
Lisa [name],  Thank you for coming in today.  Your A1c is 6.2 which means you no longer have diabetes.  Please continue to be careful with your diet. Avoiding fried foods, salty foods and too many sweets. Enjoy water, vegetables, grilled and baked foods and the occasional ginger ale.   For your blood pressure/stroke prevention: Please restart hydralazine and HCTZ. Look into purchasing and blood pressure meter for you to use at home. Check [facility].  Take aspirin daily  Take lovastatin nightly.   See me in two weeks.   Dr. Armen Pickup

## 2013-01-20 NOTE — Assessment & Plan Note (Signed)
A: Declined due to lack of medications. No signs of end organ damage. P: Restart hydralazine 50 mg 3 times daily and HCTZ. Patient currently not a candidate for an ACE inhibitor as she had an elevation in her creatinine with Ace inhibitor was last started.  Close followup in 2 weeks.

## 2013-01-20 NOTE — Progress Notes (Signed)
  Subjective:    Patient ID: Lisa [name], female    DOB: 09-07-1950, 62 y.o.   MRN: [id number]  HPI 62 year old female presents with her sister for follow  visit to discuss the following:  #1 hypertension: Patient has been out of medications for the past 6 weeks. She denies chest pain, headaches, vision changes, dyspnea on exertion, lower extremity edema. She's compliant low-salt diet.  #2 diabetes type 2: Patient with well-controlled type 2 diabetes. She is not a pilot metformin. She denies polyuria, polydipsia, GI upset. She does admit to intermittent pain and numbness in her great toes bilaterally.  #3 depression: Patient still with intermittent depression mostly related to the death of her husband almost a year ago. She has moved in with her sister in [place] since her last office visit. She (happy living with her sister.  Review of Systems As per HPI      Objective: BP 220/110  Pulse 66  Temp(Src) 97.7 F (36.5 C) (Oral)  Ht 5\' 5"  (1.651 m)  Wt 153 lb (69.4 kg)  BMI 25.46 kg/m2   Physical Exam  Constitutional: She appears well-developed and well-nourished. No distress.  Cardiovascular: Normal rate, regular rhythm, normal heart sounds and intact distal pulses.   Pulmonary/Chest: Effort normal and breath sounds normal. No respiratory distress.  Musculoskeletal: She exhibits no edema.   Diabetic foot exam done today and documented.    Assessment & Plan:

## 2013-01-20 NOTE — Assessment & Plan Note (Signed)
Normal A1c today.  Remove metformin from that list. Will change to history of diabetes type 2.

## 2013-01-21 ENCOUNTER — Encounter: Payer: Self-pay | Admitting: Family Medicine

## 2013-02-03 ENCOUNTER — Encounter: Payer: Self-pay | Admitting: Family Medicine

## 2013-02-03 ENCOUNTER — Telehealth: Payer: Self-pay | Admitting: Family Medicine

## 2013-02-03 ENCOUNTER — Ambulatory Visit (INDEPENDENT_AMBULATORY_CARE_PROVIDER_SITE_OTHER): Payer: Self-pay | Admitting: Family Medicine

## 2013-02-03 VITALS — BP 180/80 | HR 88 | Temp 97.7°F | Ht 65.0 in | Wt 151.0 lb

## 2013-02-03 DIAGNOSIS — F4321 Adjustment disorder with depressed mood: Secondary | ICD-10-CM

## 2013-02-03 DIAGNOSIS — E785 Hyperlipidemia, unspecified: Secondary | ICD-10-CM

## 2013-02-03 DIAGNOSIS — Z5989 Other problems related to housing and economic circumstances: Secondary | ICD-10-CM | POA: Insufficient documentation

## 2013-02-03 DIAGNOSIS — Z598 Other problems related to housing and economic circumstances: Secondary | ICD-10-CM

## 2013-02-03 DIAGNOSIS — I10 Essential (primary) hypertension: Secondary | ICD-10-CM

## 2013-02-03 MED ORDER — HYDRALAZINE HCL 100 MG PO TABS: 100.0000 mg | ORAL_TABLET | Freq: Three times a day (TID) | ORAL | Status: DC

## 2013-02-03 MED ORDER — LOVASTATIN 40 MG PO TABS: 40.0000 mg | ORAL_TABLET | Freq: Every day | ORAL | Status: DC

## 2013-02-03 MED ORDER — HYDRALAZINE HCL 50 MG PO TABS: 50.0000 mg | ORAL_TABLET | Freq: Three times a day (TID) | ORAL | Status: DC

## 2013-02-03 NOTE — Progress Notes (Signed)
  Subjective:    Patient ID: Lisa [name], female    DOB: 1951/01/13, 63 y.o.   MRN: [id number]  HPI 62 year old female presents with her sister for follow visit to discuss the following:  #1 hypertension: Patient compliant with hydrochlorothiazide once daily. She's taking hydralazine 25 mg 3 times daily. Review of systems as per below. She has no problem obtaining her medication. She has stopped eating salt. She continues to smoke 1.5-2 cigarettes daily but is ready to quit. She walks outside with her sister 15-30 minutes to 3 times weekly.  #2 depression: Patient is compliant with Celexa. She denies suicidal ideation. She reports that her mood is "good". She has not been crying. She is laughing a lot.  Review of Systems  Constitutional: Negative.   Eyes: Negative for visual disturbance.  Respiratory: Negative for cough.   Cardiovascular: Negative for leg swelling.      Objective: BP 180/80  Pulse 88  Temp(Src) 97.7 F (36.5 C) (Oral)  Ht 5\' 5"  (1.651 m)  Wt 151 lb (68.493 kg)  BMI 25.13 kg/m2   Physical Exam  Constitutional: She appears well-developed and well-nourished. No distress.  Cardiovascular: Normal rate, regular rhythm, normal heart sounds and intact distal pulses.   Pulmonary/Chest: Effort normal and breath sounds normal. No respiratory distress.  Musculoskeletal: She exhibits no edema.  Psychiatric: She has a normal mood and affect. Her behavior is normal. Thought content normal.      Assessment & Plan:

## 2013-02-03 NOTE — Assessment & Plan Note (Signed)
Uninsured and due for mammogram Provided patient with info about [facility] program to get mammogram done.

## 2013-02-03 NOTE — Assessment & Plan Note (Signed)
A: improved with celexa P:  Continue Celexa.

## 2013-02-03 NOTE — Telephone Encounter (Signed)
Called pharmacy to clarify orders sent via EP[facility]  lovastatin 40 mg nightly Hydralazine 50 mg TID

## 2013-02-03 NOTE — Patient Instructions (Addendum)
Lisa [name],  Thank you for coming in to see me today.  Please continue your current medication:  I have resent your cholesterol medicine.   For BP: Taper up on hydralazine.  Tomorrow start 2 (25 mg) tablets (50 mg) twice daily. At the drug store you will have 50 mg tablets, take 1 tablet three times  daily after you run out of the 25 mg tablets you have.   See me in 4 weeks for pap smear and BP follow up.   Dr. Armen Pickup   For mammogram: Please call Stoney Bang, [phone],  with the [facility] (breast and cervical cancer screening program) at the [facility] to set up an appointment to verify eligibility for a breast exam and mammogram.  If you qualify this will be set up at [facility].

## 2013-02-03 NOTE — Assessment & Plan Note (Signed)
A: improved. Not yet at goal.  P:  Plan to continue HCTZ 25 mg daily. Plan increase hydralazine to 50 mg 3 times daily. Patient to followup with me in 4 weeks for further hydralazine titration as needed to pressure of less than 140/90.

## 2013-02-04 LAB — POC HEMOCCULT BLD/STL (HOME/3-CARD/SCREEN): Card #3 Fecal Occult Blood, POC: NEGATIVE

## 2013-02-04 NOTE — Addendum Note (Signed)
Addended by: [place], Izaiha Lo on: 02/04/2013 05:45 PM   Modules accepted: Orders

## 2013-02-17 ENCOUNTER — Ambulatory Visit: Payer: Self-pay

## 2013-02-24 ENCOUNTER — Ambulatory Visit: Payer: Self-pay

## 2013-03-04 ENCOUNTER — Encounter: Payer: Self-pay | Admitting: Family Medicine

## 2013-03-04 ENCOUNTER — Ambulatory Visit (INDEPENDENT_AMBULATORY_CARE_PROVIDER_SITE_OTHER): Payer: Self-pay | Admitting: Family Medicine

## 2013-03-04 VITALS — BP 178/76 | HR 106 | Temp 98.1°F | Ht 65.0 in | Wt 157.5 lb

## 2013-03-04 DIAGNOSIS — I10 Essential (primary) hypertension: Secondary | ICD-10-CM

## 2013-03-04 MED ORDER — HYDRALAZINE HCL 100 MG PO TABS: 100.0000 mg | ORAL_TABLET | Freq: Three times a day (TID) | ORAL | Status: DC

## 2013-03-04 NOTE — Progress Notes (Signed)
  Subjective:    Patient ID: ELIZEBATH WEVER, female    DOB: [birth year], 62 y.o.   MRN: [id number]  HPI 62 yo F presents for f/u visit:  1. HTN:  Meds: compliant  ROS: negative for HA, vision changes, CP, SOB, LE edema.  Soc hx: smoking 3-4 times per week.  Family hx: older sister with stroke today.    Review of Systems As per HPI     Objective:   Physical Exam BP 178/76  Pulse 106  Temp(Src) 98.1 F (36.7 C) (Oral)  Ht 5\' 5"  (1.651 m)  Wt 157 lb 8 oz (71.442 kg)  BMI 26.21 kg/m2 General appearance: alert, cooperative and no distress Neck: no carotid bruit Lungs: clear to auscultation bilaterally Heart: regular rate and rhythm, S1, S2 normal, no murmur, click, rub or gallop Extremities: extremities normal, atraumatic, no cyanosis or edema Neurologic: Grossly normal    Assessment & Plan:

## 2013-03-04 NOTE — Patient Instructions (Signed)
Mrs. Hillebrand,  Thank you for coming in to see me today. Your blood pressure is improved. Nearing goal of < 140/90  Increase hydralazine to 100 mg three times daily.  See me in one month   Dr. Armen Pickup

## 2013-03-04 NOTE — Assessment & Plan Note (Signed)
A: improved P: Your blood pressure is improved. Nearing goal of < 140/90  Increase hydralazine to 100 mg three times daily.  See me in one month

## 2013-03-21 ENCOUNTER — Ambulatory Visit: Payer: Self-pay

## 2013-04-04 ENCOUNTER — Ambulatory Visit (INDEPENDENT_AMBULATORY_CARE_PROVIDER_SITE_OTHER): Payer: Self-pay | Admitting: Family Medicine

## 2013-04-04 ENCOUNTER — Encounter: Payer: Self-pay | Admitting: Family Medicine

## 2013-04-04 VITALS — BP 186/84 | HR 90 | Ht 65.0 in | Wt 158.3 lb

## 2013-04-04 DIAGNOSIS — I10 Essential (primary) hypertension: Secondary | ICD-10-CM

## 2013-04-04 MED ORDER — AMLODIPINE BESYLATE 10 MG PO TABS: 10.0000 mg | ORAL_TABLET | Freq: Every day | ORAL | Status: DC

## 2013-04-04 NOTE — Progress Notes (Signed)
   Subjective:    Patient ID: Lisa [name], female    DOB: 1951/02/08, 62 y.o.   MRN: [id number]  HPI 62 yo F presents for hypertension f/u:  1. HTN: Medications: compliant ROS: denies HA, vision changes, CP, SOB and LE edema Compliant with low salt diet.   2. Itching: had itching this past weekend on L dorsal foot. Developed sore after rubbing the area. Unsure if there was a preceding blister. Mild itching currently. No o there lesions. Had similar lesions in the past. No new medications. No oral itching or swelling.   Smoking: one cig every 2-3 days.   Review of Systems As per HPI     Objective:   Physical Exam BP 186/84  Pulse 90  Ht 5\' 5"  (1.651 m)  Wt 158 lb 4.8 oz (71.804 kg)  BMI 26.34 kg/m2 BP Readings from Last 3 Encounters:  04/04/13 186/84  03/04/13 178/76  02/03/13 180/80  General appearance: alert, cooperative and no distress Lungs: clear to auscultation bilaterally Heart: regular rate and rhythm, S1, S2 normal, no murmur, click, rub or gallop Extremities: extremities normal, atraumatic, no cyanosis or edema Skin: 1x1 cm sore on L dorsal foot. No edema or streaking erythema.   L foot lesion cleaned with alcohol, triple antibiotic ointment applied. Bandage applied.        Assessment & Plan:

## 2013-04-04 NOTE — Patient Instructions (Addendum)
Lisa [name],  Thank you so much for coming in to see me today.   Regarding your blood pressure: I have added Norvasc 10 mg nightly to your regimen. Ultimately, I would like you to have an ultrasound of your kidneys to rule out a condition called renal artery stenosis that can also cause high blood pressure. I will work on getting this once your have the orange card.  Happy holidays! Please see me in 3-4 weeks   Dr. Armen Pickup

## 2013-04-04 NOTE — Assessment & Plan Note (Signed)
A: above goal. ? Secondary hypertension like RAS or primary  hyperaldosteronism. P: Add Norvasc to regimen Renal ultrasound once insured.  F/u in 3-4 weeks

## 2013-04-13 ENCOUNTER — Telehealth: Payer: Self-pay | Admitting: *Deleted

## 2013-04-13 DIAGNOSIS — I10 Essential (primary) hypertension: Secondary | ICD-10-CM

## 2013-04-13 MED ORDER — HYDRALAZINE HCL 100 MG PO TABS: 100.0000 mg | ORAL_TABLET | Freq: Three times a day (TID) | ORAL | Status: AC

## 2013-04-13 NOTE — Telephone Encounter (Signed)
Hydralazine refill sent in.

## 2013-04-13 NOTE — Telephone Encounter (Signed)
Received call from [facility] pharmacy requesting refill on hydralazine on pt.  States that the last refill was 03/11/2013.  Will forward to MD. Milas Gain, Maryjo Rochester

## 2013-04-13 NOTE — Addendum Note (Signed)
Addended by: Dessa Phi on: 04/13/2013 01:34 PM   Modules accepted: Orders

## 2013-04-25 ENCOUNTER — Ambulatory Visit (INDEPENDENT_AMBULATORY_CARE_PROVIDER_SITE_OTHER): Payer: Self-pay | Admitting: Family Medicine

## 2013-04-25 ENCOUNTER — Encounter: Payer: Self-pay | Admitting: Family Medicine

## 2013-04-25 VITALS — BP 150/84 | HR 83 | Ht 65.0 in | Wt 158.0 lb

## 2013-04-25 DIAGNOSIS — F17200 Nicotine dependence, unspecified, uncomplicated: Secondary | ICD-10-CM

## 2013-04-25 DIAGNOSIS — I10 Essential (primary) hypertension: Secondary | ICD-10-CM

## 2013-04-25 DIAGNOSIS — Z720 Tobacco use: Secondary | ICD-10-CM

## 2013-04-25 DIAGNOSIS — G4700 Insomnia, unspecified: Secondary | ICD-10-CM

## 2013-04-25 MED ORDER — TRAZODONE HCL 50 MG PO TABS
25.0000 mg | ORAL_TABLET | Freq: Every day | ORAL | Status: DC
Start: 2013-04-25 — End: 2020-09-17

## 2013-04-25 MED ORDER — TRAZODONE HCL 50 MG PO TABS: 25.0000 mg | ORAL_TABLET | Freq: Every day | ORAL | Status: DC

## 2013-04-25 NOTE — Progress Notes (Signed)
   Subjective:    Patient ID: Lisa SacramentoWillie M Hamman, female    DOB: Aug 24, 1950, 63 y.o.   MRN: [id number]  HPI 63 yo F presents for f/u visit with her sister:  1. HTN: compliant with all meds except hydralazine x 5 days. Denies HA, vision changes, CP, SOB,, DOE and LE edema.   2. Insomnia: x 6 months or more. Trouble falling asleep and staying asleep. Sleeps for 2-3 hrs. Watches TV in bedroom when she awakens. Quit smoking 04/07/13. Denies snoring, alcohol, IDU and heavy meals or snacks before bed. Tried febreeze nighttime. Has not tried OTC sleep aids. Admits to thoughts about her deceased husband interfere with sleep. Does exercise by walking on treadmill and lighting weights most days of the week. No celexa x 3 days   Review of Systems As per HPI     Objective:   Physical Exam BP 150/84  Pulse 83  Ht 5\' 5"  (1.651 m)  Wt 158 lb (71.668 kg)  BMI 26.29 kg/m2  SpO2 98% BP Readings from Last 3 Encounters:  04/25/13 150/84  04/04/13 186/84  03/04/13 178/76    Wt Readings from Last 3 Encounters:  04/25/13 158 lb (71.668 kg)  04/04/13 158 lb 4.8 oz (71.804 kg)  03/04/13 157 lb 8 oz (71.442 kg)  General appearance: alert, cooperative and no distress Lungs: clear to auscultation bilaterally Heart: regular rate and rhythm, S1, S2 normal, no murmur, click, rub or gallop Extremities: extremities normal, atraumatic, no cyanosis or edema    Assessment & Plan:

## 2013-04-25 NOTE — Assessment & Plan Note (Signed)
Quit 04/07/13. Congratulated efforts!

## 2013-04-25 NOTE — Assessment & Plan Note (Signed)
A: with trouble falling asleep and sleep latency P Please start trazodone 25 mg nightly. May take 50 mg nightly if needed for sleep.  Sleep hygiene:  Remove tv from room. If you are not asleep within 20 minutes go to a different room to read.  Continue healthy diet and daily exercise.   F/u with me in 4 weeks.

## 2013-04-25 NOTE — Patient Instructions (Signed)
Ivar DrapeWillie,  Thank you for coming in today.  Please start trazodone 25 mg nightly. May take 50 mg nightly if needed for sleep.  Sleep hygiene:  Remove tv from room. If you are not asleep within 20 minutes go to a different room to read.  Continue healthy diet and daily exercise.   F/u with me in 4 weeks.   Dr. Armen PickupFunches

## 2013-04-25 NOTE — Assessment & Plan Note (Signed)
A: improved. Still above goal P: Restart hydralazine 100 mg TID, perhaps patient will require lower dose since smoking cessation. Will readdress at f/u.

## 2013-05-23 ENCOUNTER — Ambulatory Visit: Payer: Self-pay | Admitting: Family Medicine

## 2013-09-19 ENCOUNTER — Ambulatory Visit: Payer: Self-pay

## 2014-05-11 ENCOUNTER — Ambulatory Visit: Payer: Self-pay

## 2018-11-18 ENCOUNTER — Emergency Department (HOSPITAL_COMMUNITY): Payer: Medicare Other

## 2018-11-18 ENCOUNTER — Inpatient Hospital Stay (HOSPITAL_COMMUNITY)
Admission: EM | Admit: 2018-11-18 | Discharge: 2018-11-20 | DRG: 286 | Disposition: A | Discharge status: Home / Self Care | Payer: Medicare Other | Attending: Family Medicine | Admitting: Family Medicine

## 2018-11-18 ENCOUNTER — Other Ambulatory Visit: Payer: Self-pay

## 2018-11-18 ENCOUNTER — Ambulatory Visit (HOSPITAL_BASED_OUTPATIENT_CLINIC_OR_DEPARTMENT_OTHER): Payer: Medicare Other

## 2018-11-18 ENCOUNTER — Encounter (HOSPITAL_COMMUNITY): Payer: Self-pay | Admitting: Emergency Medicine

## 2018-11-18 DIAGNOSIS — D696 Thrombocytopenia, unspecified: Secondary | ICD-10-CM | POA: Diagnosis present

## 2018-11-18 DIAGNOSIS — I340 Nonrheumatic mitral (valve) insufficiency: Secondary | ICD-10-CM | POA: Diagnosis not present

## 2018-11-18 DIAGNOSIS — Z885 Allergy status to narcotic agent status: Secondary | ICD-10-CM

## 2018-11-18 DIAGNOSIS — I10 Essential (primary) hypertension: Secondary | ICD-10-CM | POA: Diagnosis present

## 2018-11-18 DIAGNOSIS — I25110 Atherosclerotic heart disease of native coronary artery with unstable angina pectoris: Secondary | ICD-10-CM | POA: Diagnosis present

## 2018-11-18 DIAGNOSIS — N183 Chronic kidney disease, stage 3 (moderate): Secondary | ICD-10-CM | POA: Diagnosis present

## 2018-11-18 DIAGNOSIS — E119 Type 2 diabetes mellitus without complications: Secondary | ICD-10-CM | POA: Diagnosis not present

## 2018-11-18 DIAGNOSIS — Z716 Tobacco abuse counseling: Secondary | ICD-10-CM

## 2018-11-18 DIAGNOSIS — I5021 Acute systolic (congestive) heart failure: Secondary | ICD-10-CM

## 2018-11-18 DIAGNOSIS — F1210 Cannabis abuse, uncomplicated: Secondary | ICD-10-CM | POA: Diagnosis present

## 2018-11-18 DIAGNOSIS — R0602 Shortness of breath: Secondary | ICD-10-CM | POA: Diagnosis not present

## 2018-11-18 DIAGNOSIS — I5041 Acute combined systolic (congestive) and diastolic (congestive) heart failure: Secondary | ICD-10-CM | POA: Diagnosis present

## 2018-11-18 DIAGNOSIS — R748 Abnormal levels of other serum enzymes: Secondary | ICD-10-CM

## 2018-11-18 DIAGNOSIS — D631 Anemia in chronic kidney disease: Secondary | ICD-10-CM | POA: Diagnosis present

## 2018-11-18 DIAGNOSIS — R7989 Other specified abnormal findings of blood chemistry: Secondary | ICD-10-CM

## 2018-11-18 DIAGNOSIS — N179 Acute kidney failure, unspecified: Secondary | ICD-10-CM | POA: Diagnosis present

## 2018-11-18 DIAGNOSIS — Z20828 Contact with and (suspected) exposure to other viral communicable diseases: Secondary | ICD-10-CM | POA: Diagnosis present

## 2018-11-18 DIAGNOSIS — E1122 Type 2 diabetes mellitus with diabetic chronic kidney disease: Secondary | ICD-10-CM | POA: Diagnosis present

## 2018-11-18 DIAGNOSIS — Z8673 Personal history of transient ischemic attack (TIA), and cerebral infarction without residual deficits: Secondary | ICD-10-CM

## 2018-11-18 DIAGNOSIS — D649 Anemia, unspecified: Secondary | ICD-10-CM | POA: Diagnosis present

## 2018-11-18 DIAGNOSIS — Z888 Allergy status to other drugs, medicaments and biological substances status: Secondary | ICD-10-CM

## 2018-11-18 DIAGNOSIS — Z720 Tobacco use: Secondary | ICD-10-CM | POA: Diagnosis present

## 2018-11-18 DIAGNOSIS — I130 Hypertensive heart and chronic kidney disease with heart failure and stage 1 through stage 4 chronic kidney disease, or unspecified chronic kidney disease: Secondary | ICD-10-CM | POA: Diagnosis not present

## 2018-11-18 DIAGNOSIS — I160 Hypertensive urgency: Secondary | ICD-10-CM | POA: Diagnosis present

## 2018-11-18 DIAGNOSIS — E785 Hyperlipidemia, unspecified: Secondary | ICD-10-CM | POA: Diagnosis present

## 2018-11-18 DIAGNOSIS — I361 Nonrheumatic tricuspid (valve) insufficiency: Secondary | ICD-10-CM

## 2018-11-18 DIAGNOSIS — F1410 Cocaine abuse, uncomplicated: Secondary | ICD-10-CM | POA: Diagnosis present

## 2018-11-18 DIAGNOSIS — R945 Abnormal results of liver function studies: Secondary | ICD-10-CM

## 2018-11-18 DIAGNOSIS — F17210 Nicotine dependence, cigarettes, uncomplicated: Secondary | ICD-10-CM | POA: Diagnosis present

## 2018-11-18 DIAGNOSIS — N289 Disorder of kidney and ureter, unspecified: Secondary | ICD-10-CM

## 2018-11-18 DIAGNOSIS — Z8711 Personal history of peptic ulcer disease: Secondary | ICD-10-CM

## 2018-11-18 DIAGNOSIS — Z7982 Long term (current) use of aspirin: Secondary | ICD-10-CM

## 2018-11-18 DIAGNOSIS — Z79899 Other long term (current) drug therapy: Secondary | ICD-10-CM

## 2018-11-18 DIAGNOSIS — R778 Other specified abnormalities of plasma proteins: Secondary | ICD-10-CM

## 2018-11-18 DIAGNOSIS — Z7151 Drug abuse counseling and surveillance of drug abuser: Secondary | ICD-10-CM

## 2018-11-18 DIAGNOSIS — Z886 Allergy status to analgesic agent status: Secondary | ICD-10-CM

## 2018-11-18 HISTORY — DX: Acute kidney failure, unspecified: N17.9

## 2018-11-18 HISTORY — DX: Essential (primary) hypertension: I10

## 2018-11-18 LAB — CBC WITH DIFFERENTIAL/PLATELET
Abs Immature Granulocytes: 0.04 10*3/uL (ref 0.00–0.07)
Basophils Absolute: 0 10*3/uL (ref 0.0–0.1)
Basophils Relative: 1 %
Eosinophils Absolute: 0.1 10*3/uL (ref 0.0–0.5)
Eosinophils Relative: 2 %
HCT: 39.2 % (ref 36.0–46.0)
Hemoglobin: 11.9 g/dL — ABNORMAL LOW (ref 12.0–15.0)
Immature Granulocytes: 1 %
Lymphocytes Relative: 40 %
Lymphs Abs: 1.8 10*3/uL (ref 0.7–4.0)
MCH: 29.8 pg (ref 26.0–34.0)
MCHC: 30.4 g/dL (ref 30.0–36.0)
MCV: 98.2 fL (ref 80.0–100.0)
Monocytes Absolute: 0.3 10*3/uL (ref 0.1–1.0)
Monocytes Relative: 7 %
Neutro Abs: 2.3 10*3/uL (ref 1.7–7.7)
Neutrophils Relative %: 49 %
Platelets: 107 10*3/uL — ABNORMAL LOW (ref 150–400)
RBC: 3.99 MIL/uL (ref 3.87–5.11)
RDW: 14.2 % (ref 11.5–15.5)
WBC: 4.6 10*3/uL (ref 4.0–10.5)
nRBC: 0 % (ref 0.0–0.2)

## 2018-11-18 LAB — HEPATITIS PANEL, ACUTE
HCV Ab: <0.1 s/co ratio (ref 0.0–0.9)
Hep A IgM: NEGATIVE
Hep B C IgM: NEGATIVE
Hepatitis B Surface Ag: NEGATIVE

## 2018-11-18 LAB — URINALYSIS, ROUTINE W REFLEX MICROSCOPIC
Bilirubin Urine: NEGATIVE
Glucose, UA: NEGATIVE mg/dL
Ketones, ur: NEGATIVE mg/dL
Nitrite: POSITIVE — AB
Protein, ur: NEGATIVE mg/dL
Specific Gravity, Urine: 1.009 (ref 1.005–1.030)
pH: 5 (ref 5.0–8.0)

## 2018-11-18 LAB — CBG MONITORING, ED
Glucose-Capillary: 144 mg/dL — ABNORMAL HIGH (ref 70–99)
Glucose-Capillary: 102 mg/dL — ABNORMAL HIGH (ref 70–99)

## 2018-11-18 LAB — COMPREHENSIVE METABOLIC PANEL
ALT: 75 U/L — ABNORMAL HIGH (ref 0–44)
AST: 141 U/L — ABNORMAL HIGH (ref 15–41)
Albumin: 3.3 g/dL — ABNORMAL LOW (ref 3.5–5.0)
Alkaline Phosphatase: 129 U/L — ABNORMAL HIGH (ref 38–126)
Anion gap: 12 (ref 5–15)
BUN: 22 mg/dL (ref 8–23)
CO2: 19 mmol/L — ABNORMAL LOW (ref 22–32)
Calcium: 8 mg/dL — ABNORMAL LOW (ref 8.9–10.3)
Chloride: 107 mmol/L (ref 98–111)
Creatinine, Ser: 1.74 mg/dL — ABNORMAL HIGH (ref 0.44–1.00)
GFR calc Af Amer: 35 mL/min — ABNORMAL LOW (ref >60)
GFR calc non Af Amer: 30 mL/min — ABNORMAL LOW (ref >60)
Glucose, Bld: 335 mg/dL — ABNORMAL HIGH (ref 70–99)
Potassium: 3.8 mmol/L (ref 3.5–5.1)
Sodium: 138 mmol/L (ref 135–145)
Total Bilirubin: 0.4 mg/dL (ref 0.3–1.2)
Total Protein: 7.3 g/dL (ref 6.5–8.1)

## 2018-11-18 LAB — HEMOGLOBIN A1C
Hgb A1c MFr Bld: 6.5 % — ABNORMAL HIGH (ref 4.8–5.6)
Mean Plasma Glucose: 139.85 mg/dL

## 2018-11-18 LAB — RETICULOCYTES
Immature Retic Fract: 12.7 % (ref 2.3–15.9)
RBC.: 3.77 MIL/uL — ABNORMAL LOW (ref 3.87–5.11)
Retic Count, Absolute: 47.1 10*3/uL (ref 19.0–186.0)
Retic Ct Pct: 1.3 % (ref 0.4–3.1)

## 2018-11-18 LAB — VITAMIN B12: Vitamin B-12: 292 pg/mL (ref 180–914)

## 2018-11-18 LAB — CBC
HCT: 35.8 % — ABNORMAL LOW (ref 36.0–46.0)
Hemoglobin: 11.3 g/dL — ABNORMAL LOW (ref 12.0–15.0)
MCH: 30 pg (ref 26.0–34.0)
MCHC: 31.6 g/dL (ref 30.0–36.0)
MCV: 95 fL (ref 80.0–100.0)
Platelets: 201 10*3/uL (ref 150–400)
RBC: 3.77 MIL/uL — ABNORMAL LOW (ref 3.87–5.11)
RDW: 14.2 % (ref 11.5–15.5)
WBC: 8.5 10*3/uL (ref 4.0–10.5)
nRBC: 0 % (ref 0.0–0.2)

## 2018-11-18 LAB — GLUCOSE, CAPILLARY: Glucose-Capillary: 149 mg/dL — ABNORMAL HIGH (ref 70–99)

## 2018-11-18 LAB — HIV ANTIBODY (ROUTINE TESTING W REFLEX): HIV Screen 4th Generation wRfx: NONREACTIVE

## 2018-11-18 LAB — IRON AND TIBC
Iron: 35 ug/dL (ref 28–170)
Saturation Ratios: 9 % — ABNORMAL LOW (ref 10.4–31.8)
TIBC: 412 ug/dL (ref 250–450)
UIBC: 377 ug/dL

## 2018-11-18 LAB — ECHOCARDIOGRAM COMPLETE
Height: 65 in
Weight: 2176 oz

## 2018-11-18 LAB — ACETAMINOPHEN LEVEL: Acetaminophen (Tylenol), Serum: <10 ug/mL — ABNORMAL LOW (ref 10–30)

## 2018-11-18 LAB — RAPID URINE DRUG SCREEN, HOSP PERFORMED
Amphetamines: NOT DETECTED
Barbiturates: NOT DETECTED
Benzodiazepines: NOT DETECTED
Cocaine: POSITIVE — AB
Opiates: NOT DETECTED
Tetrahydrocannabinol: POSITIVE — AB

## 2018-11-18 LAB — SARS CORONAVIRUS 2 BY RT PCR (HOSPITAL ORDER, PERFORMED IN ~~LOC~~ HOSPITAL LAB): SARS Coronavirus 2: NEGATIVE

## 2018-11-18 LAB — BRAIN NATRIURETIC PEPTIDE: B Natriuretic Peptide: 544.4 pg/mL — ABNORMAL HIGH (ref 0.0–100.0)

## 2018-11-18 LAB — FERRITIN: Ferritin: 25 ng/mL (ref 11–307)

## 2018-11-18 LAB — CREATININE, SERUM
Creatinine, Ser: 1.51 mg/dL — ABNORMAL HIGH (ref 0.44–1.00)
GFR calc Af Amer: 41 mL/min — ABNORMAL LOW (ref >60)
GFR calc non Af Amer: 35 mL/min — ABNORMAL LOW (ref >60)

## 2018-11-18 LAB — TROPONIN I (HIGH SENSITIVITY)
Troponin I (High Sensitivity): 52 ng/L — ABNORMAL HIGH (ref <18)
Troponin I (High Sensitivity): 224 ng/L (ref <18)

## 2018-11-18 LAB — FOLATE: Folate: 14.3 ng/mL (ref >5.9)

## 2018-11-18 MED ORDER — HYDRALAZINE HCL 20 MG/ML IJ SOLN
10.0000 mg | INTRAMUSCULAR | Status: DC | PRN
  Administered 2018-11-18: 10 mg via INTRAVENOUS
  Filled 2018-11-18: qty 1

## 2018-11-18 MED ORDER — ACETAMINOPHEN 325 MG PO TABS
650.0000 mg | ORAL_TABLET | Freq: Four times a day (QID) | ORAL | Status: DC | PRN
  Administered 2018-11-18 – 2018-11-19 (×4): 650 mg via ORAL
  Filled 2018-11-18 (×4): qty 2

## 2018-11-18 MED ORDER — ALUM & MAG HYDROXIDE-SIMETH 200-200-20 MG/5ML PO SUSP
30.0000 mL | Freq: Once | ORAL | Status: AC
  Administered 2018-11-18: 02:00:00 30 mL via ORAL
  Filled 2018-11-18: qty 30

## 2018-11-18 MED ORDER — ASPIRIN 81 MG PO CHEW
324.0000 mg | CHEWABLE_TABLET | Freq: Once | ORAL | Status: AC
  Administered 2018-11-18: 324 mg via ORAL
  Filled 2018-11-18: qty 4

## 2018-11-18 MED ORDER — INSULIN ASPART 100 UNIT/ML ~~LOC~~ SOLN
0.0000 [IU] | Freq: Three times a day (TID) | SUBCUTANEOUS | Status: DC
  Administered 2018-11-18: 1 [IU] via SUBCUTANEOUS
  Administered 2018-11-19 – 2018-11-20 (×2): 2 [IU] via SUBCUTANEOUS

## 2018-11-18 MED ORDER — FUROSEMIDE 10 MG/ML IJ SOLN
40.0000 mg | Freq: Once | INTRAMUSCULAR | Status: AC
  Administered 2018-11-18: 40 mg via INTRAVENOUS
  Filled 2018-11-18: qty 4

## 2018-11-18 MED ORDER — AMLODIPINE BESYLATE 5 MG PO TABS
5.0000 mg | ORAL_TABLET | Freq: Every day | ORAL | Status: DC
  Administered 2018-11-18: 15:00:00 5 mg via ORAL
  Filled 2018-11-18: qty 1

## 2018-11-18 MED ORDER — ACETAMINOPHEN 650 MG RE SUPP: 650.0000 mg | Freq: Four times a day (QID) | RECTAL | Status: DC | PRN

## 2018-11-18 MED ORDER — ENOXAPARIN SODIUM 40 MG/0.4ML ~~LOC~~ SOLN
40.0000 mg | SUBCUTANEOUS | Status: DC
  Administered 2018-11-18: 40 mg via SUBCUTANEOUS
  Filled 2018-11-18: qty 0.4

## 2018-11-18 MED ORDER — ASPIRIN EC 325 MG PO TBEC
325.0000 mg | DELAYED_RELEASE_TABLET | Freq: Every day | ORAL | Status: DC
  Administered 2018-11-18 – 2018-11-20 (×3): 325 mg via ORAL
  Filled 2018-11-18 (×3): qty 1

## 2018-11-18 MED ORDER — ENOXAPARIN SODIUM 30 MG/0.3ML ~~LOC~~ SOLN
30.0000 mg | SUBCUTANEOUS | Status: DC
  Filled 2018-11-18: qty 0.3

## 2018-11-18 MED ORDER — TRAZODONE HCL 50 MG PO TABS
25.0000 mg | ORAL_TABLET | Freq: Every evening | ORAL | Status: DC | PRN
  Administered 2018-11-18: 25 mg via ORAL
  Filled 2018-11-18: qty 1

## 2018-11-18 MED ORDER — ONDANSETRON HCL 4 MG/2ML IJ SOLN: 4.0000 mg | Freq: Four times a day (QID) | INTRAMUSCULAR | Status: DC | PRN

## 2018-11-18 MED ORDER — NITROGLYCERIN 2 % TD OINT
1.0000 [in_us] | TOPICAL_OINTMENT | Freq: Once | TRANSDERMAL | Status: AC
  Administered 2018-11-18: 1 [in_us] via TOPICAL
  Filled 2018-11-18: qty 1

## 2018-11-18 MED ORDER — LABETALOL HCL 5 MG/ML IV SOLN
20.0000 mg | INTRAVENOUS | Status: DC | PRN
  Administered 2018-11-18: 20 mg via INTRAVENOUS
  Filled 2018-11-18: qty 4

## 2018-11-18 MED ORDER — ONDANSETRON HCL 4 MG PO TABS: 4.0000 mg | ORAL_TABLET | Freq: Four times a day (QID) | ORAL | Status: DC | PRN

## 2018-11-18 NOTE — ED Notes (Signed)
Echo at bedside

## 2018-11-18 NOTE — ED Provider Notes (Signed)
[facility] Provider Note   CSN: [id number] Arrival date & time: 11/18/18  0100    History   Chief Complaint Chief Complaint  Patient presents with   Shortness of Breath    HPI Lisa [name] is a 68 y.o. female.   The history is provided by the patient.  She has history of diabetes, hypertension, hyperlipidemia, peptic ulcer and comes in because of difficulty breathing.  She noted difficulty breathing when she lay down to go to sleep.  She feels better sitting.  She denies chest pain, heaviness, tightness, pressure.  She denies fever or chills or cough.  She denies exposure to anyone with COVID-19.  She states that she has not seen a doctor in a long time and has not been taking her blood pressure medications.  She denies any headache or visual change.  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      Home Medications    Prior to Admission medications   Medication Sig Start Date End Date Taking? Authorizing Provider  amLODipine (NORVASC) 10 MG tablet Take 1 tablet (10 mg total) by mouth at bedtime. 04/04/13   Dessa PhiFunches, Josalyn, MD  aspirin 325 MG EC tablet Take 325 mg by mouth daily.     [provider]  citalopram (CELEXA) 20 MG tablet Take 1 tablet (20 mg total) by mouth daily. 11/23/12 11/23/13  Charlane FerrettiMarsh, Melanie C, MD  hydrochlorothiazide (HYDRODIURIL) 25 MG tablet TAKE ONE TABLET BY MOUTH EVERY DAY 01/20/13   Funches, Gerilyn NestleJosalyn, MD  lovastatin (MEVACOR) 40 MG tablet Take 1 tablet (40 mg total) by mouth at bedtime. 02/03/13   Funches, Gerilyn NestleJosalyn, MD  traZODone (DESYREL) 50 MG tablet Take 0.5-1 tablets (25-50 mg total) by mouth at bedtime. 04/25/13   Dessa PhiFunches, Josalyn, MD    Family History Family History  Problem Relation Age of Onset   Cancer Sister        Lung    Cancer Brother        Throat    Birth defects Son     Social History Social History   Tobacco Use   Smoking status: Former Smoker    Years: 25.00    Types: Cigarettes    Quit date: 04/07/2013    Years since quitting: 5.6   Smokeless tobacco: Former NeurosurgeonUser    Quit date: 04/07/2013   Tobacco comment: 2 cigs a day  Substance Use Topics   Alcohol use: Yes    Comment: Socially, drinks 3 drinks twice per week    Drug use: No     Allergies   Ace inhibitors, Aspirin, Codeine, and Hydrocodone   Review of Systems Review of Systems  All other  systems reviewed and are negative.    Physical Exam Updated Vital Signs BP (!) 238/134    Pulse (!) 136    Temp 97.7 F (36.5 C) (Oral)    Resp (!) 27    Ht 5\' 5"  (1.651 m)    Wt 61.7 kg    SpO2 94%    BMI 22.63 kg/m   Physical Exam Vitals signs and nursing note reviewed.    68 year old female, resting comfortably and in no acute distress. Vital signs are significant for markedly elevated blood pressure, heart rate, respiratory rate. Oxygen saturation is 94%, which is normal. Head is normocephalic and atraumatic. PERRLA, EOMI. Oropharynx is clear. Neck is nontender and supple without adenopathy. JVD is present at 90 degrees. Back is nontender and there is no CVA tenderness. Lungs have bilateral rales going about halfway up with expiratory rhonchi.   There are no wheezes. Chest is nontender. Heart is tachycardic without murmur. Abdomen is soft, flat, nontender without masses or hepatosplenomegaly and peristalsis is normoactive. Extremities have no cyanosis or edema, full range of motion is present. Skin is warm and mildly diaphoretic without rash. Neurologic: Mental status is normal, cranial nerves are intact, there are no motor or sensory deficits.  ED Treatments / Results  Labs (all labs ordered are listed, but only abnormal results are displayed) Labs Reviewed  COMPREHENSIVE METABOLIC PANEL - Abnormal; Notable for the following components:      Result Value   CO2 19 (*)    Glucose, Bld 335 (*)    Creatinine, Ser 1.74 (*)    Calcium 8.0 (*)    Albumin 3.3 (*)    AST 141 (*)    ALT 75 (*)    Alkaline Phosphatase 129 (*)    GFR calc non Af Amer 30 (*)    GFR calc Af Amer 35 (*)    All other components within normal limits  CBC WITH DIFFERENTIAL/PLATELET - Abnormal; Notable for the following components:   Hemoglobin 11.9 (*)    Platelets 107 (*)    All other components within normal limits  TROPONIN I (HIGH SENSITIVITY) - Abnormal; Notable for the following components:   Troponin I (High Sensitivity) 52 (*)    All other components within normal limits  SARS CORONAVIRUS 2 (HOSPITAL ORDER, [place] [facility])  BRAIN NATRIURETIC PEPTIDE  URINALYSIS, ROUTINE W REFLEX MICROSCOPIC  TROPONIN I (HIGH SENSITIVITY)    EKG EKG Interpretation  Date/Time:  Thursday November 18 2018 01:24:39 EDT Ventricular Rate:  121 PR Interval:    QRS Duration: 92 QT Interval:  331 QTC Calculation: 470 R Axis:   41 Text Interpretation:  Sinus tachycardia Consider right atrial enlargement Consider left ventricular hypertrophy Nonspecific T abnormalities, inferior leads When compared with ECG of 04/30/2012, HEART RATE has increased Nonspecific T wave abnormality is now present Confirmed by Delora Fuel ([id number]) on 11/18/2018  1:28:43 AM   Radiology Dg Chest Port 1 View  Result Date: 11/18/2018 CLINICAL DATA:  Shortness of breath EXAM: PORTABLE CHEST 1 VIEW COMPARISON:  None. FINDINGS: Cardiomegaly, vascular congestion. Diffuse interstitial prominence. Bilateral lower lobe airspace opacities. Findings most compatible with edema/CHF. No visible effusions or acute bony abnormality. IMPRESSION: Moderate edema/CHF. Electronically Signed   By: Rolm Baptise M.D.   On: 11/18/2018 01:52    Procedures Procedures  CRITICAL CARE Performed by: Delora Fuel Total critical care time: 85 minutes Critical care time was exclusive of separately billable procedures and treating other patients. Critical care was  necessary to treat or prevent imminent or life-threatening deterioration. Critical care was time spent personally by me on the following activities: development of treatment plan with patient and/or surrogate as well as nursing, discussions with consultants, evaluation of patient's response to treatment, examination of patient, obtaining history from patient or surrogate, ordering and performing treatments and interventions, ordering and review of laboratory studies, ordering and review of radiographic studies, pulse oximetry and re-evaluation of patient's condition.  Medications Ordered in ED Medications  labetalol (NORMODYNE) injection 20 mg (20 mg Intravenous Given 11/18/18 0159)  nitroGLYCERIN (NITROGLYN) 2 % ointment 1 inch (1 inch Topical Given 11/18/18 0148)  aspirin chewable tablet 324 mg (324 mg Oral Given 11/18/18 0148)  alum & mag hydroxide-simeth (MAALOX/MYLANTA) 200-200-20 MG/5ML suspension 30 mL (30 mLs Oral Given 11/18/18 0148)  furosemide (LASIX) injection 40 mg (40 mg Intravenous Given 11/18/18 0159)     Initial Impression / Assessment and Plan / ED Course  I have reviewed the triage vital signs and the nursing notes.  Pertinent labs & imaging results that were available during my care of the patient were reviewed  by me and considered in my medical decision making (see chart for details).  Acute dyspnea which appears to be heart failure.  Markedly elevated blood pressure which represents hypertensive urgency.  Heart failure is probably secondary to hypertension.  Will check chest x-ray and screening labs.  ECG does show LVH with strain pattern, strain pattern is new.  She will be given labetalol for blood pressure control and also will be given topical nitroglycerin and aspirin.  It is noted that she does have a history of ulcers, and acid is given along with the aspirin.  Old records are reviewed confirming outpatient management of high blood pressure and diabetes, but last office visit on record was January 2015.  2:31 AM Blood pressure and heart rate have both come down dramatically with above-noted treatment.  Chest x-ray does confirm heart failure, so she is given a dose of furosemide.  3:40 AM Labs show renal insufficiency which is new compared with 2014.  This is likely nephrosclerosis from untreated hypertension rather than acute kidney injury.  Elevated transaminases and alkaline phosphatase of uncertain cause.  Troponin is indeterminate, delta troponin pending.  BNP is still pending.  Case is discussed with Dr. Toniann FailKakrakandy of [facility], who agrees to admit the patient.  Final Clinical Impressions(s) / ED Diagnoses   Final diagnoses:  Hypertensive urgency  Acute systolic heart failure (HCC)  Renal insufficiency  Elevated troponin  Elevated liver enzymes    ED Discharge Orders    None       Dione BoozeGlick, Shene Maxfield, MD 11/18/18 [phone]

## 2018-11-18 NOTE — ED Notes (Signed)
Pt taken off of O2 and tolerating well. Spo2 at 96% on RA. Denies shob.

## 2018-11-18 NOTE — ED Triage Notes (Signed)
Pt present to ED from home BIB [facility]. Pt reports sudden onset SOB at 2200. Pt reports this has never happened before. EMS found her as 60% O2. Put pt on 15L NRB sat 100%. EMS VS HTN, SVT.

## 2018-11-18 NOTE — ED Notes (Signed)
Lunch tray ordered 

## 2018-11-18 NOTE — Progress Notes (Signed)
PROGRESS NOTE    Lisa [name] Sprong  HYQ:[id number]:[id number] DOB: 20-Nov-1950 DOA: 11/18/2018 PCP: Patient, No Pcp Per   Brief Narrative:  Lisa [name] Marylyn IshiharaHerbin is a 68 y.o. female with history of hypertension diabetes mellitus type 2 has not been to a physician for more than 5 years presented to the ER with sudden onset of shortness of breath which started night before presentation when patient was going to the bed.  She did not have any chest pain or cough or chills.  Smokes cigarettes every day and drinks alcohol about 60 ounces on the weekend.   Upon arrival to ER patient blood pressure was found to be markedly elevated with systolic in the 254-141 labs show creatinine 1.7 hemoglobin 11.9 platelets 107 blood sugar 335 bicarb 19 anion gap 12 AST was 141 ALT of 75 chest x-ray was showing pulmonary edema.  EKG was showing sinus tachycardia.  Patient was given labetalol 20 mg IV for blood pressure control following which blood pressure improved.  Lasix 40 mg IV was given for possible acute CHF.  Patient was placed on nitroglycerin patch.  Patient admitted under hospitalist service for further management.  Subjective: Patient seen and examined in the ER.  She states that she feels much better.  Her breathing is much better.  She was comfortable on room air.  She complained of some minimal upper abdominal pain.  She tells me that she has not seen a physician for past 5 years and thus has not been taking any medication but now she has picked 1 physician and she is going to make an appointment soon.  Assessment & Plan:   Principal Problem:   Hypertensive urgency Active Problems:   ARF (acute renal failure) (HCC)   Diabetes mellitus type 2 in nonobese (HCC)   Normocytic normochromic anemia   Tobacco abuse   Hypertensive urgency: Patient blood pressure is much better now with systolic around 150-160.  Continue amlodipine 5 mg with PRN hydralazine.  Acute pulmonary edema/acute congestive heart failure: No  prior echo present so unknown type of congestive heart failure.  This is likely due to complication of hypertensive urgency.  Troponin slightly elevated.  Echo pending.  She received 1 dose of Lasix.  Hold off to further Lasix now that she is comfortable on room air.  AKI versus chronic kidney disease: No prior labs available in chart to compare but she came in with creatinine of 1.7.  Unknown whether this is acute versus chronic.  Repeat labs tomorrow.  Avoid nephrotoxic agents in the meantime.  Alcoholic hepatitis: Elevated LFTs very typical of alcoholic liver disease with AST being doubled in ALT.  Viral hepatitis panel pending.  Repeat labs in the morning.  Ultrasound abdomen.  Type 2 diabetes mellitus:  globin A1c only 6.5 without event taking any medications.  Continue SSI.  Normochromic normocytic anemia: Stable.  11.3.  Thrombocytopenia: Initially platelets were 107 but currently they are 201.  Previous history of stroke: On aspirin.  DVT prophylaxis: Lovenox Code Status: Full code Family Communication: None present.  Patient alert and oriented.  Discussed plan of care with the patient.  Answered questions. Disposition Plan: DVT  Objective: Vitals:   11/18/18 0645 11/18/18 0700 11/18/18 0745 11/18/18 0830  BP: (!) 160/94 (!) 154/80 (!) 155/126   Pulse: 82 82 92 86  Resp: 15 17 (!) 30 (!) 21  Temp:      TempSrc:      SpO2: 100% 100% 99% 100%  Weight:  Height:        Intake/Output Summary (Last 24 hours) at 11/18/2018 1202 Last data filed at 11/18/2018 0104 Gross per 24 hour  Intake 0 ml  Output 0 ml  Net 0 ml   Filed Weights   11/18/18 0115  Weight: 61.7 kg    Consultants:   None  Procedures:   None  Antimicrobials:   None  Objective: Vitals:   11/18/18 0645 11/18/18 0700 11/18/18 0745 11/18/18 0830  BP: (!) 160/94 (!) 154/80 (!) 155/126   Pulse: 82 82 92 86  Resp: 15 17 (!) 30 (!) 21  Temp:      TempSrc:      SpO2: 100% 100% 99% 100%  Weight:       Height:        Intake/Output Summary (Last 24 hours) at 11/18/2018 1202 Last data filed at 11/18/2018 0104 Gross per 24 hour  Intake 0 ml  Output 0 ml  Net 0 ml   Filed Weights   11/18/18 0115  Weight: 61.7 kg    Examination:  General exam: Appears calm and comfortable  Respiratory system: Diminished breath sounds throughout with some crackles at the right base.  No wheezes or rhonchi.Marland Kitchen. Respiratory effort normal. Cardiovascular system: S1 & S2 heard, RRR. No JVD, murmurs, rubs, gallops or clicks. No pedal edema. Gastrointestinal system: Abdomen is nondistended, soft and nontender. No organomegaly or masses felt. Normal bowel sounds heard. Central nervous system: Alert and oriented. No focal neurological deficits. Extremities: Symmetric 5 x 5 power. Skin: No rashes, lesions or ulcers Psychiatry: Judgement and insight appear normal. Mood & affect appropriate.    Data Reviewed: I have personally reviewed following labs and imaging studies  CBC: Recent Labs  Lab 11/18/18 0221 11/18/18 0735  WBC 4.6 8.5  NEUTROABS 2.3  --   HGB 11.9* 11.3*  HCT 39.2 35.8*  MCV 98.2 95.0  PLT 107* 201   Basic Metabolic Panel: Recent Labs  Lab 11/18/18 0221 11/18/18 0735  NA 138  --   K 3.8  --   CL 107  --   CO2 19*  --   GLUCOSE 335*  --   BUN 22  --   CREATININE 1.74* 1.51*  CALCIUM 8.0*  --    GFR: Estimated Creatinine Clearance: 32.5 mL/min (A) (by C-G formula based on SCr of 1.51 mg/dL (H)). Liver Function Tests: Recent Labs  Lab 11/18/18 0221  AST 141*  ALT 75*  ALKPHOS 129*  BILITOT 0.4  PROT 7.3  ALBUMIN 3.3*   No results for input(s): LIPASE, AMYLASE in the last 168 hours. No results for input(s): AMMONIA in the last 168 hours. Coagulation Profile: No results for input(s): INR, PROTIME in the last 168 hours. Cardiac Enzymes: No results for input(s): CKTOTAL, CKMB, CKMBINDEX, TROPONINI in the last 168 hours. BNP (last 3 results) No results for input(s):  PROBNP in the last 8760 hours. HbA1C: Recent Labs    11/18/18 0735  HGBA1C 6.5*   CBG: Recent Labs  Lab 11/18/18 0835  GLUCAP 144*   Lipid Profile: No results for input(s): CHOL, HDL, LDLCALC, TRIG, CHOLHDL, LDLDIRECT in the last 72 hours. Thyroid Function Tests: No results for input(s): TSH, T4TOTAL, FREET4, T3FREE, THYROIDAB in the last 72 hours. Anemia Panel: Recent Labs    11/18/18 0735 11/18/18 0736  VITAMINB12 292  --   FOLATE  --  14.3  FERRITIN 25  --   TIBC 412  --   IRON 35  --  RETICCTPCT 1.3  --    Sepsis Labs: No results for input(s): PROCALCITON, LATICACIDVEN in the last 168 hours.  Recent Results (from the past 240 hour(s))  SARS Coronavirus 2 [facility] order, Performed in [facility] hospital lab) Nasopharyngeal Nasopharyngeal Swab     Status: None   Collection Time: 11/18/18  1:47 AM   Specimen: Nasopharyngeal Swab  Result Value Ref Range Status   SARS Coronavirus 2 NEGATIVE NEGATIVE Final    Comment: (NOTE) If result is NEGATIVE SARS-CoV-2 target nucleic acids are NOT DETECTED. The SARS-CoV-2 RNA is generally detectable in upper and lower  respiratory specimens during the acute phase of infection. The lowest  concentration of SARS-CoV-2 viral copies this assay can detect is 250  copies / mL. A negative result does not preclude SARS-CoV-2 infection  and should not be used as the sole basis for treatment or other  patient management decisions.  A negative result may occur with  improper specimen collection / handling, submission of specimen other  than nasopharyngeal swab, presence of viral mutation(s) within the  areas targeted by this assay, and inadequate number of viral copies  (<250 copies / mL). A negative result must be combined with clinical  observations, patient history, and epidemiological information. If result is POSITIVE SARS-CoV-2 target nucleic acids are DETECTED. The SARS-CoV-2 RNA is generally detectable in upper and lower   respiratory specimens dur ing the acute phase of infection.  Positive  results are indicative of active infection with SARS-CoV-2.  Clinical  correlation with patient history and other diagnostic information is  necessary to determine patient infection status.  Positive results do  not rule out bacterial infection or co-infection with other viruses. If result is PRESUMPTIVE POSTIVE SARS-CoV-2 nucleic acids MAY BE PRESENT.   A presumptive positive result was obtained on the submitted specimen  and confirmed on repeat testing.  While 2019 novel coronavirus  (SARS-CoV-2) nucleic acids may be present in the submitted sample  additional confirmatory testing may be necessary for epidemiological  and / or clinical management purposes  to differentiate between  SARS-CoV-2 and other Sarbecovirus currently known to infect humans.  If clinically indicated additional testing with an alternate test  methodology [phone]) is advised. The SARS-CoV-2 RNA is generally  detectable in upper and lower respiratory sp ecimens during the acute  phase of infection. The expected result is Negative. Fact Sheet for Patients:  StrictlyIdeas.no Fact Sheet for Healthcare Providers: BankingDealers.co.za This test is not yet approved or cleared by the [place] FDA and has been authorized for detection and/or diagnosis of SARS-CoV-2 by FDA under an Emergency Use Authorization (EUA).  This EUA will remain in effect (meaning this test can be used) for the duration of the COVID-19 declaration under Section 564(b)(1) of the Act, 21 U.S.C. section 360bbb-3(b)(1), unless the authorization is terminated or revoked sooner. Performed at [facility], [place] [address], [place], [place] [address]       Radiology Studies: Dg Chest [facility] 1 View  Result Date: 11/18/2018 CLINICAL DATA:  Shortness of breath EXAM: PORTABLE CHEST 1 VIEW COMPARISON:  None. FINDINGS:  Cardiomegaly, vascular congestion. Diffuse interstitial prominence. Bilateral lower lobe airspace opacities. Findings most compatible with edema/CHF. No visible effusions or acute bony abnormality. IMPRESSION: Moderate edema/CHF. Electronically Signed   By: Rolm Baptise M.D.   On: 11/18/2018 01:52    Scheduled Meds: . amLODipine  5 mg Oral Daily  . aspirin  325 mg Oral Daily  . enoxaparin (LOVENOX) injection  30  mg Subcutaneous Q24H  . insulin aspart  0-9 Units Subcutaneous TID WC   Continuous Infusions:   LOS: 0 days   Time spent: 35 minutes   Hughie Clossavi Nera Haworth, MD [facility] Pager [phone]  If 7PM-7AM, please contact night-coverage [email or web address] Password [email or web address] 11/18/2018, 12:02 PM

## 2018-11-18 NOTE — ED Notes (Signed)
Pt O2 sat doing well on 4L [place]. This Rn brought pt down to 3L

## 2018-11-18 NOTE — H&P (Addendum)
History and Physical    Lisa [name] FAO:[id number] DOB: [birth year] DOA: 11/18/2018  PCP: Patient, No Pcp Per  Patient coming from: Home.  Chief Complaint: Shortness of breath.  HPI: Lisa [name] is a 68 y.o. female with history of hypertension diabetes mellitus type 2 has not been to a physician for more than 5 years presents to the ER with sudden onset of shortness of breath which started last night when patient was going to the bed.  Denies any chest pain productive cough fever or chills.  Smokes cigarettes every day and drinks alcohol about 60 ounces on the weekend.  Given the sudden onset of shortness of breath patient was brought to the ER.  Patient did not notice any swelling of the extremities but does have extremity pain from arthritis.  ED Course: In the ER patient blood pressure was found to be markedly elevated with systolic in the 254-141 labs show creatinine 1.7 hemoglobin 11.9 platelets 107 blood sugar 335 bicarb 19 anion gap 12 AST was 141 ALT of 75 chest x-ray was showing congestion.  EKG was showing sinus tachycardia.  Patient was given labetalol 20 mg IV for blood pressure control following which blood pressure improved.  Lasix 40 mg IV was given for possible acute CHF.  Patient was placed on nitroglycerin patch.  Patient admitted for further management of hypertensive urgency and also possible CHF.  Review of Systems: As per HPI, rest all negative.   Past Medical History:  Diagnosis Date   Blood transfusion    Diabetes mellitus (HCC) 2007   High cholesterol 2007   Hypertension    Peptic ulcer 1965    Past Surgical History:  Procedure Laterality Date   CESAREAN SECTION       reports that she quit smoking about 5 years ago. Her smoking use included cigarettes. She quit after 25.00 years of use. She quit smokeless tobacco use about 5 years ago. She reports current alcohol use. She reports that she does not use drugs.  Allergies  Allergen Reactions    Ace Inhibitors     Elevated Cr for 0.8 to 1.4 (30 percent or greater rise in serum creatinine)    Aspirin Other (See Comments)    Irritates ulcers   Codeine     "upsets myulcers"   Hydrocodone     "upsets my ulcers"    Family History  Problem Relation Age of Onset   Cancer Sister        Lung    Cancer Brother        Throat    Birth defects Son     Prior to Admission medications   Medication Sig Start Date End Date Taking? Authorizing Provider  aspirin 325 MG EC tablet Take 325 mg by mouth daily.   Yes [provider]  amLODipine (NORVASC) 10 MG tablet Take 1 tablet (10 mg total) by mouth at bedtime. Patient not taking: Reported on 11/18/2018 04/04/13   Dessa PhiFunches, Josalyn, MD  citalopram (CELEXA) 20 MG tablet Take 1 tablet (20 mg total) by mouth daily. Patient not taking: Reported on 11/18/2018 11/23/12 11/18/27  Charlane FerrettiMarsh, Melanie C, MD  hydrochlorothiazide (HYDRODIURIL) 25 MG tablet TAKE ONE TABLET BY MOUTH EVERY DAY Patient not taking: Reported on 11/18/2018 01/20/13   Dessa PhiFunches, Josalyn, MD  lovastatin (MEVACOR) 40 MG tablet Take 1 tablet (40 mg total) by mouth at bedtime. Patient not taking: Reported on 11/18/2018 02/03/13   Dessa PhiFunches, Josalyn, MD  traZODone (DESYREL) 50 MG tablet Take  0.5-1 tablets (25-50 mg total) by mouth at bedtime. Patient not taking: Reported on 11/18/2018 04/25/13   Boykin Nearing, MD    Physical Exam: Constitutional: Moderately built and nourished. Vitals:   11/18/18 0430 11/18/18 0445 11/18/18 0500 11/18/18 0515  BP: (!) 159/92 (!) 151/87 (!) 153/92 (!) 156/93  Pulse: 85 90 81 83  Resp: (!) 32 (!) 23 19 15   Temp:      TempSrc:      SpO2: 100% 100% 100% 100%  Weight:      Height:       Eyes: Anicteric no pallor. ENMT: No discharge from the ears eyes nose or mouth. Neck: No mass felt.  No neck rigidity but no JVD appreciated. Respiratory: No rhonchi or crepitations. Cardiovascular: S1-S2 heard. Abdomen: Soft nontender bowel sounds  present. Musculoskeletal: No edema. Skin: No rash. Neurologic: Alert awake oriented to time place and person.  Moves all extremities. Psychiatric: Appears normal per normal affect.   Labs on Admission: I have personally reviewed following labs and imaging studies  CBC: Recent Labs  Lab 11/18/18 0221  WBC 4.6  NEUTROABS 2.3  HGB 11.9*  HCT 39.2  MCV 98.2  PLT 161*   Basic Metabolic Panel: Recent Labs  Lab 11/18/18 0221  NA 138  K 3.8  CL 107  CO2 19*  GLUCOSE 335*  BUN 22  CREATININE 1.74*  CALCIUM 8.0*   GFR: Estimated Creatinine Clearance: 28.2 mL/min (A) (by C-G formula based on SCr of 1.74 mg/dL (H)). Liver Function Tests: Recent Labs  Lab 11/18/18 0221  AST 141*  ALT 75*  ALKPHOS 129*  BILITOT 0.4  PROT 7.3  ALBUMIN 3.3*   No results for input(s): LIPASE, AMYLASE in the last 168 hours. No results for input(s): AMMONIA in the last 168 hours. Coagulation Profile: No results for input(s): INR, PROTIME in the last 168 hours. Cardiac Enzymes: No results for input(s): CKTOTAL, CKMB, CKMBINDEX, TROPONINI in the last 168 hours. BNP (last 3 results) No results for input(s): PROBNP in the last 8760 hours. HbA1C: No results for input(s): HGBA1C in the last 72 hours. CBG: No results for input(s): GLUCAP in the last 168 hours. Lipid Profile: No results for input(s): CHOL, HDL, LDLCALC, TRIG, CHOLHDL, LDLDIRECT in the last 72 hours. Thyroid Function Tests: No results for input(s): TSH, T4TOTAL, FREET4, T3FREE, THYROIDAB in the last 72 hours. Anemia Panel: No results for input(s): VITAMINB12, FOLATE, FERRITIN, TIBC, IRON, RETICCTPCT in the last 72 hours. Urine analysis:    Component Value Date/Time   COLORURINE YELLOW 02/13/2010 0943   APPEARANCEUR TURBID (A) 02/13/2010 0943   LABSPEC 1.010 02/13/2010 0943   PHURINE 5.5 02/13/2010 0943   GLUCOSEU NEGATIVE 02/13/2010 0943   HGBUR SMALL (A) 02/13/2010 0943   BILIRUBINUR NEG 01/20/2013 0846   KETONESUR  NEGATIVE 02/13/2010 0943   PROTEINUR NEG 01/20/2013 0846   PROTEINUR NEGATIVE 02/13/2010 0943   UROBILINOGEN 0.2 01/20/2013 0846   UROBILINOGEN 0.2 02/13/2010 0943   NITRITE NEG 01/20/2013 0846   NITRITE NEGATIVE 02/13/2010 0943   LEUKOCYTESUR Trace 01/20/2013 0846   Sepsis Labs: @LABRCNTIP (procalcitonin:4,lacticidven:4) ) Recent Results (from the past 240 hour(s))  SARS Coronavirus 2 [facility] order, Performed in [facility] hospital lab) Nasopharyngeal Nasopharyngeal Swab     Status: None   Collection Time: 11/18/18  1:47 AM   Specimen: Nasopharyngeal Swab  Result Value Ref Range Status   SARS Coronavirus 2 NEGATIVE NEGATIVE Final    Comment: (NOTE) If result is NEGATIVE SARS-CoV-2 target nucleic acids are NOT  DETECTED. The SARS-CoV-2 RNA is generally detectable in upper and lower  respiratory specimens during the acute phase of infection. The lowest  concentration of SARS-CoV-2 viral copies this assay can detect is 250  copies / mL. A negative result does not preclude SARS-CoV-2 infection  and should not be used as the sole basis for treatment or other  patient management decisions.  A negative result may occur with  improper specimen collection / handling, submission of specimen other  than nasopharyngeal swab, presence of viral mutation(s) within the  areas targeted by this assay, and inadequate number of viral copies  (<250 copies / mL). A negative result must be combined with clinical  observations, patient history, and epidemiological information. If result is POSITIVE SARS-CoV-2 target nucleic acids are DETECTED. The SARS-CoV-2 RNA is generally detectable in upper and lower  respiratory specimens dur ing the acute phase of infection.  Positive  results are indicative of active infection with SARS-CoV-2.  Clinical  correlation with patient history and other diagnostic information is  necessary to determine patient infection status.  Positive results do  not rule out  bacterial infection or co-infection with other viruses. If result is PRESUMPTIVE POSTIVE SARS-CoV-2 nucleic acids MAY BE PRESENT.   A presumptive positive result was obtained on the submitted specimen  and confirmed on repeat testing.  While 2019 novel coronavirus  (SARS-CoV-2) nucleic acids may be present in the submitted sample  additional confirmatory testing may be necessary for epidemiological  and / or clinical management purposes  to differentiate between  SARS-CoV-2 and other Sarbecovirus currently known to infect humans.  If clinically indicated additional testing with an alternate test  methodology [phone]([id number]) is advised. The SARS-CoV-2 RNA is generally  detectable in upper and lower respiratory sp ecimens during the acute  phase of infection. The expected result is Negative. Fact Sheet for Patients:  BoilerBrush.com.cyhttps://www.fda.gov/media/136312/download Fact Sheet for Healthcare Providers: https://pope.com/https://www.fda.gov/media/136313/download This test is not yet approved or cleared by the [place] FDA and has been authorized for detection and/or diagnosis of SARS-CoV-2 by FDA under an Emergency Use Authorization (EUA).  This EUA will remain in effect (meaning this test can be used) for the duration of the COVID-19 declaration under Section 564(b)(1) of the Act, 21 U.S.C. section 360bbb-3(b)(1), unless the authorization is terminated or revoked sooner. Performed at [facility], [address] [place], [place] [address]      Radiological Exams on Admission: Dg Chest Port 1 View  Result Date: 11/18/2018 CLINICAL DATA:  Shortness of breath EXAM: PORTABLE CHEST 1 VIEW COMPARISON:  None. FINDINGS: Cardiomegaly, vascular congestion. Diffuse interstitial prominence. Bilateral lower lobe airspace opacities. Findings most compatible with edema/CHF. No visible effusions or acute bony abnormality. IMPRESSION: Moderate edema/CHF. Electronically Signed   By: Charlett NoseKevin  Dover M.D.   On: 11/18/2018 01:52     EKG: Independently reviewed.  Sinus tachycardia.  Assessment/Plan Principal Problem:   Hypertensive urgency Active Problems:   ARF (acute renal failure) (HCC)   Diabetes mellitus type 2 in nonobese (HCC)   Normocytic normochromic anemia   Tobacco abuse    1. Hypertensive urgency -blood pressure improved with IV labetalol.  We will keep patient on amlodipine 5 mg and PRN IV hydralazine and closely follow blood pressure trends.  Patient is on nitroglycerin patch. 2. Acute pulmonary edema likely from hypertensive urgency.  Symptoms improved with Lasix 40 mg IV.  Will check 2D echo follow intake output daily weights and based on the clinical symptoms will consider adding more  Lasix doses. 3. Diabetes mellitus type 2 -we will check hemoglobin A1c and for now patient is on sliding scale coverage. 4. Normocytic normochromic anemia follow CBC check anemia panel. 5. Elevated LFTs -patient states he only drinks on the weekends.  Not sure if patient has alcohol hepatitis.  Will check acute hepatitis panel Tylenol levels follow LFTs. 6. Thrombocytopenia -appears to be new.  May be related to liver disease.  Follow CBC closely. 7. Previous history of stroke on aspirin. 8. Renal failure -not sure if is acute or chronic.  Last creatinine available in the system was in 2014.  Closely follow metabolic panel check UA.   DVT prophylaxis: Lovenox.  Note that patient has thrombocytopenia. Code Status: Full code. Family Communication: Discussed with patient. Disposition Plan: Home. Consults called: None. Admission status: Observation.   Eduard ClosArshad N Kayliee Atienza MD [facility] Pager [phone].  If 7PM-7AM, please contact night-coverage [email or web address] Password [email or web address]  11/18/2018, 5:39 AM

## 2018-11-18 NOTE — ED Notes (Signed)
Culture sent in addition to UA 

## 2018-11-18 NOTE — ED Notes (Signed)
Bedside commode placed in pt's room 

## 2018-11-19 ENCOUNTER — Encounter (HOSPITAL_COMMUNITY)
Admission: EM | Disposition: A | Discharge status: Home / Self Care | Payer: Self-pay | Source: Home / Self Care | Attending: Family Medicine

## 2018-11-19 ENCOUNTER — Inpatient Hospital Stay (HOSPITAL_COMMUNITY): Payer: Medicare Other

## 2018-11-19 DIAGNOSIS — Z716 Tobacco abuse counseling: Secondary | ICD-10-CM | POA: Diagnosis not present

## 2018-11-19 DIAGNOSIS — I5041 Acute combined systolic (congestive) and diastolic (congestive) heart failure: Secondary | ICD-10-CM | POA: Diagnosis present

## 2018-11-19 DIAGNOSIS — I130 Hypertensive heart and chronic kidney disease with heart failure and stage 1 through stage 4 chronic kidney disease, or unspecified chronic kidney disease: Secondary | ICD-10-CM | POA: Diagnosis present

## 2018-11-19 DIAGNOSIS — E785 Hyperlipidemia, unspecified: Secondary | ICD-10-CM | POA: Diagnosis present

## 2018-11-19 DIAGNOSIS — Z885 Allergy status to narcotic agent status: Secondary | ICD-10-CM | POA: Diagnosis not present

## 2018-11-19 DIAGNOSIS — I25110 Atherosclerotic heart disease of native coronary artery with unstable angina pectoris: Secondary | ICD-10-CM | POA: Diagnosis present

## 2018-11-19 DIAGNOSIS — F1210 Cannabis abuse, uncomplicated: Secondary | ICD-10-CM | POA: Diagnosis present

## 2018-11-19 DIAGNOSIS — R0602 Shortness of breath: Secondary | ICD-10-CM | POA: Diagnosis present

## 2018-11-19 DIAGNOSIS — N183 Chronic kidney disease, stage 3 (moderate): Secondary | ICD-10-CM | POA: Diagnosis present

## 2018-11-19 DIAGNOSIS — I340 Nonrheumatic mitral (valve) insufficiency: Secondary | ICD-10-CM | POA: Diagnosis present

## 2018-11-19 DIAGNOSIS — I160 Hypertensive urgency: Secondary | ICD-10-CM | POA: Diagnosis present

## 2018-11-19 DIAGNOSIS — Z79899 Other long term (current) drug therapy: Secondary | ICD-10-CM | POA: Diagnosis not present

## 2018-11-19 DIAGNOSIS — Z8673 Personal history of transient ischemic attack (TIA), and cerebral infarction without residual deficits: Secondary | ICD-10-CM | POA: Diagnosis not present

## 2018-11-19 DIAGNOSIS — Z888 Allergy status to other drugs, medicaments and biological substances status: Secondary | ICD-10-CM | POA: Diagnosis not present

## 2018-11-19 DIAGNOSIS — Z886 Allergy status to analgesic agent status: Secondary | ICD-10-CM | POA: Diagnosis not present

## 2018-11-19 DIAGNOSIS — E1122 Type 2 diabetes mellitus with diabetic chronic kidney disease: Secondary | ICD-10-CM | POA: Diagnosis present

## 2018-11-19 DIAGNOSIS — D696 Thrombocytopenia, unspecified: Secondary | ICD-10-CM | POA: Diagnosis present

## 2018-11-19 DIAGNOSIS — Z7982 Long term (current) use of aspirin: Secondary | ICD-10-CM | POA: Diagnosis not present

## 2018-11-19 DIAGNOSIS — D631 Anemia in chronic kidney disease: Secondary | ICD-10-CM | POA: Diagnosis present

## 2018-11-19 DIAGNOSIS — Z7151 Drug abuse counseling and surveillance of drug abuser: Secondary | ICD-10-CM | POA: Diagnosis not present

## 2018-11-19 DIAGNOSIS — Z8711 Personal history of peptic ulcer disease: Secondary | ICD-10-CM | POA: Diagnosis not present

## 2018-11-19 DIAGNOSIS — F1410 Cocaine abuse, uncomplicated: Secondary | ICD-10-CM | POA: Diagnosis present

## 2018-11-19 DIAGNOSIS — Z20828 Contact with and (suspected) exposure to other viral communicable diseases: Secondary | ICD-10-CM | POA: Diagnosis present

## 2018-11-19 DIAGNOSIS — F17210 Nicotine dependence, cigarettes, uncomplicated: Secondary | ICD-10-CM | POA: Diagnosis present

## 2018-11-19 HISTORY — PX: RIGHT/LEFT HEART CATH AND CORONARY ANGIOGRAPHY: CATH118266

## 2018-11-19 LAB — POCT I-STAT 7, (LYTES, BLD GAS, ICA,H+H)
Acid-Base Excess: 2 mmol/L (ref 0.0–2.0)
Bicarbonate: 25.6 mmol/L (ref 20.0–28.0)
Calcium, Ion: 1.13 mmol/L — ABNORMAL LOW (ref 1.15–1.40)
HCT: 37 % (ref 36.0–46.0)
Hemoglobin: 12.6 g/dL (ref 12.0–15.0)
O2 Saturation: 96 %
Potassium: 3.4 mmol/L — ABNORMAL LOW (ref 3.5–5.1)
Sodium: 142 mmol/L (ref 135–145)
TCO2: 27 mmol/L (ref 22–32)
pCO2 arterial: 34.7 mmHg (ref 32.0–48.0)
pH, Arterial: 7.476 — ABNORMAL HIGH (ref 7.350–7.450)
pO2, Arterial: 77 mmHg — ABNORMAL LOW (ref 83.0–108.0)

## 2018-11-19 LAB — POCT I-STAT EG7
Acid-Base Excess: 2 mmol/L (ref 0.0–2.0)
Bicarbonate: 25.9 mmol/L (ref 20.0–28.0)
Calcium, Ion: 1.11 mmol/L — ABNORMAL LOW (ref 1.15–1.40)
HCT: 37 % (ref 36.0–46.0)
Hemoglobin: 12.6 g/dL (ref 12.0–15.0)
O2 Saturation: 68 %
Potassium: 3.4 mmol/L — ABNORMAL LOW (ref 3.5–5.1)
Sodium: 142 mmol/L (ref 135–145)
TCO2: 27 mmol/L (ref 22–32)
pCO2, Ven: 38.2 mmHg — ABNORMAL LOW (ref 44.0–60.0)
pH, Ven: 7.439 — ABNORMAL HIGH (ref 7.250–7.430)
pO2, Ven: 34 mmHg (ref 32.0–45.0)

## 2018-11-19 LAB — CBC
HCT: 34.8 % — ABNORMAL LOW (ref 36.0–46.0)
Hemoglobin: 11.3 g/dL — ABNORMAL LOW (ref 12.0–15.0)
MCH: 30 pg (ref 26.0–34.0)
MCHC: 32.5 g/dL (ref 30.0–36.0)
MCV: 92.3 fL (ref 80.0–100.0)
Platelets: 198 10*3/uL (ref 150–400)
RBC: 3.77 MIL/uL — ABNORMAL LOW (ref 3.87–5.11)
RDW: 14.5 % (ref 11.5–15.5)
WBC: 5.4 10*3/uL (ref 4.0–10.5)
nRBC: 0 % (ref 0.0–0.2)

## 2018-11-19 LAB — GLUCOSE, CAPILLARY
Glucose-Capillary: 199 mg/dL — ABNORMAL HIGH (ref 70–99)
Glucose-Capillary: 78 mg/dL (ref 70–99)
Glucose-Capillary: 139 mg/dL — ABNORMAL HIGH (ref 70–99)

## 2018-11-19 LAB — TROPONIN I (HIGH SENSITIVITY): Troponin I (High Sensitivity): 79 ng/L — ABNORMAL HIGH (ref <18)

## 2018-11-19 LAB — BASIC METABOLIC PANEL
Anion gap: 9 (ref 5–15)
BUN: 16 mg/dL (ref 8–23)
CO2: 24 mmol/L (ref 22–32)
Calcium: 8.4 mg/dL — ABNORMAL LOW (ref 8.9–10.3)
Chloride: 105 mmol/L (ref 98–111)
Creatinine, Ser: 1.17 mg/dL — ABNORMAL HIGH (ref 0.44–1.00)
GFR calc Af Amer: 56 mL/min — ABNORMAL LOW (ref >60)
GFR calc non Af Amer: 48 mL/min — ABNORMAL LOW (ref >60)
Glucose, Bld: 131 mg/dL — ABNORMAL HIGH (ref 70–99)
Potassium: 3.5 mmol/L (ref 3.5–5.1)
Sodium: 138 mmol/L (ref 135–145)

## 2018-11-19 SURGERY — RIGHT/LEFT HEART CATH AND CORONARY ANGIOGRAPHY
Anesthesia: LOCAL

## 2018-11-19 MED ORDER — SODIUM CHLORIDE 0.9% FLUSH: 3.0000 mL | INTRAVENOUS | Status: DC | PRN

## 2018-11-19 MED ORDER — LABETALOL HCL 5 MG/ML IV SOLN: 10.0000 mg | INTRAVENOUS | Status: AC | PRN

## 2018-11-19 MED ORDER — MIDAZOLAM HCL 2 MG/2ML IJ SOLN
INTRAMUSCULAR | Status: DC | PRN
  Administered 2018-11-19: 1 mg via INTRAVENOUS

## 2018-11-19 MED ORDER — LIDOCAINE HCL (PF) 1 % IJ SOLN
INTRAMUSCULAR | Status: AC
  Filled 2018-11-19: qty 30

## 2018-11-19 MED ORDER — LIDOCAINE HCL (PF) 1 % IJ SOLN
INTRAMUSCULAR | Status: DC | PRN
  Administered 2018-11-19: 15 mL via INTRADERMAL

## 2018-11-19 MED ORDER — HYDRALAZINE HCL 20 MG/ML IJ SOLN
INTRAMUSCULAR | Status: AC
  Filled 2018-11-19: qty 1

## 2018-11-19 MED ORDER — HYDRALAZINE HCL 20 MG/ML IJ SOLN
INTRAMUSCULAR | Status: DC | PRN
  Administered 2018-11-19: 10 mg via INTRAVENOUS

## 2018-11-19 MED ORDER — HEPARIN (PORCINE) IN NACL 1000-0.9 UT/500ML-% IV SOLN
INTRAVENOUS | Status: AC
  Filled 2018-11-19: qty 1000

## 2018-11-19 MED ORDER — IOHEXOL 350 MG/ML SOLN
INTRAVENOUS | Status: DC | PRN
  Administered 2018-11-19: 16:00:00 65 mL via INTRA_ARTERIAL

## 2018-11-19 MED ORDER — LABETALOL HCL 5 MG/ML IV SOLN
INTRAVENOUS | Status: AC
  Filled 2018-11-19: qty 4

## 2018-11-19 MED ORDER — SODIUM CHLORIDE 0.9 % IV SOLN: 250.0000 mL | INTRAVENOUS | Status: DC | PRN

## 2018-11-19 MED ORDER — LABETALOL HCL 5 MG/ML IV SOLN
INTRAVENOUS | Status: DC | PRN
  Administered 2018-11-19: 5 mg via INTRAVENOUS

## 2018-11-19 MED ORDER — SODIUM CHLORIDE 0.9 % IV SOLN
INTRAVENOUS | Status: DC
  Administered 2018-11-19: 11:00:00 via INTRAVENOUS

## 2018-11-19 MED ORDER — CLOPIDOGREL BISULFATE 75 MG PO TABS
75.0000 mg | ORAL_TABLET | Freq: Every day | ORAL | Status: DC
  Administered 2018-11-20: 75 mg via ORAL
  Filled 2018-11-19: qty 1

## 2018-11-19 MED ORDER — SODIUM CHLORIDE 0.9% FLUSH: 3.0000 mL | Freq: Two times a day (BID) | INTRAVENOUS | Status: DC

## 2018-11-19 MED ORDER — NITROGLYCERIN IN D5W 200-5 MCG/ML-% IV SOLN
INTRAVENOUS | Status: AC
  Filled 2018-11-19: qty 250

## 2018-11-19 MED ORDER — SODIUM CHLORIDE 0.9% FLUSH
3.0000 mL | Freq: Two times a day (BID) | INTRAVENOUS | Status: DC
  Administered 2018-11-19: 21:00:00 3 mL via INTRAVENOUS

## 2018-11-19 MED ORDER — NITROGLYCERIN 1 MG/10 ML FOR IR/CATH LAB
INTRA_ARTERIAL | Status: DC | PRN
  Administered 2018-11-19: 200 ug via INTRACORONARY

## 2018-11-19 MED ORDER — FUROSEMIDE 10 MG/ML IJ SOLN
40.0000 mg | Freq: Once | INTRAMUSCULAR | Status: AC
  Administered 2018-11-19: 40 mg via INTRAVENOUS
  Filled 2018-11-19: qty 4

## 2018-11-19 MED ORDER — FENTANYL CITRATE (PF) 100 MCG/2ML IJ SOLN
INTRAMUSCULAR | Status: DC | PRN
  Administered 2018-11-19: 25 ug via INTRAVENOUS

## 2018-11-19 MED ORDER — AMLODIPINE BESYLATE 10 MG PO TABS
10.0000 mg | ORAL_TABLET | Freq: Every day | ORAL | Status: DC
  Administered 2018-11-19 – 2018-11-20 (×2): 10 mg via ORAL
  Filled 2018-11-19 (×2): qty 1

## 2018-11-19 MED ORDER — SODIUM CHLORIDE 0.9 % IV SOLN: INTRAVENOUS | Status: AC

## 2018-11-19 MED ORDER — FENTANYL CITRATE (PF) 100 MCG/2ML IJ SOLN
INTRAMUSCULAR | Status: AC
  Filled 2018-11-19: qty 2

## 2018-11-19 MED ORDER — HEPARIN (PORCINE) IN NACL 1000-0.9 UT/500ML-% IV SOLN
INTRAVENOUS | Status: DC | PRN
  Administered 2018-11-19 (×2): 500 mL

## 2018-11-19 MED ORDER — MIDAZOLAM HCL 2 MG/2ML IJ SOLN
INTRAMUSCULAR | Status: AC
  Filled 2018-11-19: qty 2

## 2018-11-19 SURGICAL SUPPLY — 11 items
CATH INFINITI 5FR MULTPACK ANG (CATHETERS) ×1 IMPLANT
CATH SWAN GANZ 7F STRAIGHT (CATHETERS) ×1 IMPLANT
KIT HEART LEFT (KITS) ×2 IMPLANT
NDL SMART REG 18GX2-3/4 (NEEDLE) IMPLANT
NEEDLE SMART REG 18GX2-3/4 (NEEDLE) ×2 IMPLANT
PACK CARDIAC CATHETERIZATION (CUSTOM PROCEDURE TRAY) ×2 IMPLANT
SHEATH PINNACLE 5F 10CM (SHEATH) ×1 IMPLANT
SHEATH PINNACLE 7F 10CM (SHEATH) ×1 IMPLANT
TRANSDUCER W/STOPCOCK (MISCELLANEOUS) ×2 IMPLANT
WIRE EMERALD 3MM-J .025X260CM (WIRE) ×1 IMPLANT
WIRE EMERALD 3MM-J .035X150CM (WIRE) ×1 IMPLANT

## 2018-11-19 NOTE — Evaluation (Signed)
Physical Therapy Evaluation Patient Details Name: Lisa BogaWillie Mae Cockrell MRN: [id number] DOB: 1951-01-26 Today's Date: 11/19/2018   History of Present Illness  68 y.o. female with history of hypertension diabetes mellitus type 2 has not been to a physician for more than 5 years presents to the ER with sudden onset of shortness of breath. ER patient blood pressure was found to be markedly elevated with systolic in the 254-141 labs show creatinine 1.7  Admitted 11/18/18 for treatment of HTN and AKI  Clinical Impression  PTA pt living with daughter and grandson in apartment with level entry. Pt reports independence with ambulation, ADLs, and iADLs. Pt reports needing to take breaks due to SoB when she is walking to grocery store. Pt is currently limited in safe mobility by dizziness with progression of ambulation. Pt is mod I for transfers and initially supervision for gait but progressed to min guard due to onset of dizziness. Pt will not need any PT services at discharge, however PT will continue to follow acutely to progress ambulation.     Follow Up Recommendations No PT follow up    Equipment Recommendations  3in1 (PT)       Precautions / Restrictions Precautions Precautions: None Restrictions Weight Bearing Restrictions: No      Mobility  Bed Mobility               General bed mobility comments: OOB in recliner on entry   Transfers Overall transfer level: Modified independent Equipment used: None             General transfer comment: increased time to stand, good steadying   Ambulation/Gait Ambulation/Gait assistance: Min guard;Supervision Gait Distance (Feet): 80 Feet Assistive device: None Gait Pattern/deviations: Step-through pattern;Decreased step length - left;Decreased stance time - left;Decreased weight shift to left Gait velocity: slowed Gait velocity interpretation: <1.8 ft/sec, indicate of risk for recurrent falls General Gait Details: initially min guard for  safety, with slow steady ambulation, after approximately 40 feet ambulation pt c/o dizziness on return to room pt with min guard for safety, increased fatigue with decreased L LE clearance with swing through and decreased L LE step length, mild instabiltiy        Balance Overall balance assessment: Needs assistance Sitting-balance support: Feet supported;No upper extremity supported Sitting balance-Leahy Scale: Normal     Standing balance support: No upper extremity supported;During functional activity Standing balance-Leahy Scale: Good                               Pertinent Vitals/Pain Pain Assessment: No/denies pain    Home Living Family/patient expects to be discharged to:: Private residence Living Arrangements: Children( grandson) Available Help at Discharge: Friend(s);Available 24 hours/day Type of Home: Apartment Home Access: Level entry     Home Layout: One level Home Equipment: None      Prior Function Level of Independence: Independent                     Extremity/Trunk Assessment        Lower Extremity Assessment Lower Extremity Assessment: LLE deficits/detail LLE Deficits / Details: L sided weakness from prior CVA, ROM WFL strength grossly 4-/5 LLE Sensation: WNL LLE Coordination: WNL       Communication   Communication: No difficulties  Cognition Arousal/Alertness: Awake/alert Behavior During Therapy: WFL for tasks assessed/performed Overall Cognitive Status: Within Functional Limits for tasks assessed  General Comments General comments (skin integrity, edema, etc.): pt with dizziness after ambulation of 40 feet upon return to room BP 159/116. Dizziness subsided within 2 minutes of sitting        Assessment/Plan    PT Assessment Patient needs continued PT services  PT Problem List Decreased strength;Decreased activity tolerance;Decreased balance;Decreased  mobility;Cardiopulmonary status limiting activity       PT Treatment Interventions Gait training;Functional mobility training;Therapeutic activities;Therapeutic exercise;Balance training;Cognitive remediation;Patient/family education    PT Goals (Current goals can be found in the Care Plan section)  Acute Rehab PT Goals Patient Stated Goal: go home to grandson PT Goal Formulation: With patient Time For Goal Achievement: 12/03/18 Potential to Achieve Goals: Fair    Frequency Min 3X/week    AM-PAC PT "6 Clicks" Mobility  Outcome Measure Help needed turning from your back to your side while in a flat bed without using bedrails?: None Help needed moving from lying on your back to sitting on the side of a flat bed without using bedrails?: None Help needed moving to and from a bed to a chair (including a wheelchair)?: None Help needed standing up from a chair using your arms (e.g., wheelchair or bedside chair)?: None Help needed to walk in hospital room?: None Help needed climbing 3-5 steps with a railing? : A Little 6 Click Score: 23    End of Session Equipment Utilized During Treatment: Gait belt Activity Tolerance: Treatment limited secondary to medical complications (Comment)(dizziness) Patient left: with nursing/sitter in room(in bathroom) Nurse Communication: Mobility status PT Visit Diagnosis: Unsteadiness on feet (R26.81);Other abnormalities of gait and mobility (R26.89);Difficulty in walking, not elsewhere classified (R26.2)    Time: 3845-3646 PT Time Calculation (min) (ACUTE ONLY): 19 min   Charges:   PT Evaluation $PT Eval Moderate Complexity: 1 Mod          Derris Millan B. Migdalia Dk PT, DPT Acute Rehabilitation Services Pager [phone] Office [phone]   [facility] 11/19/2018, 2:22 PM

## 2018-11-19 NOTE — Progress Notes (Signed)
   5 and 7 Fr. Sheaths were removed from the R F/A/V, and manual pressure was held for 20 min. R groin was soft and non tender. Sterile gauze was applied at the site. R DP was palpable before and after the sheath pull.  Bed rest started at 1645 X 4 hr. Instructions were given to [facility] about this time.    HR 76 SR BP 143/59 sPO2 99% on R/A

## 2018-11-19 NOTE — Progress Notes (Signed)
PROGRESS NOTE    Lisa [name]  ZOX:[id number] DOB: 01-08-1951 DOA: 11/18/2018 PCP: Patient, No Pcp Per   Brief Narrative:  Lisa RichardsWillie Mae Marylyn IshiharaHerbin is a 68 y.o. female with history of hypertension diabetes mellitus type 2 has not been to a physician for more than 5 years presented to the ER with sudden onset of shortness of breath which started night before presentation when patient was going to the bed.  She did not have any chest pain or cough or chills.  Smokes cigarettes every day and drinks alcohol about 60 ounces on the weekend.   Upon arrival to ER patient blood pressure was found to be markedly elevated with systolic in the 254-141 labs show creatinine 1.7 hemoglobin 11.9 platelets 107 blood sugar 335 bicarb 19 anion gap 12 AST was 141 ALT of 75 chest x-ray was showing pulmonary edema.  EKG was showing sinus tachycardia.  Patient was given labetalol 20 mg IV for blood pressure control following which blood pressure improved.  Lasix 40 mg IV was given for possible acute CHF.  Patient was placed on nitroglycerin patch.  Patient admitted under hospitalist service for further management.  She had slightly elevated troponin but no chest pain or shortness of breath.  After she received Lasix in the ER, she feels much better and was off of the oxygen without any dyspnea.  Echo showed significant systolic as well as diastolic congestive heart failure with ejection fraction of 25% and moderate MR.  Cardiology consulted today for possible cath.  Subjective: Patient seen and examined.  She states that she overall feels better but not back to her baseline and she still feels weak but denies any chest pain or shortness of breath.  She does not feel comfortable going home today.   Assessment & Plan:   Principal Problem:   Hypertensive urgency Active Problems:   ARF (acute renal failure) (HCC)   Diabetes mellitus type 2 in nonobese (HCC)   Normocytic normochromic anemia   Tobacco  abuse   Hypertensive urgency: Patient's blood pressure is much better than admission however still slightly elevated with systolic around 180.  Will increase amlodipine to 10 mg continue PRN hydralazine.  Acute pulmonary edema/acute combined systolic and diastolic congestive heart failure: Received 1 dose of IV Lasix 40 mg in the ER.  She still has minimal crackles at the bases.  I will give her another dose of 40 mg IV.  No prior echo available and patient denies having any history of congestive heart failure.  Echo shows 25% ejection fraction with combined systolic and diastolic congestive heart failure.  She has not seen any physician for past 5 years and this could be either acute versus chronic.  She has also slightly elevated troponin with no acute ST-T wave changes.  Will order a few more troponin levels.  I have consulted cardiology and spoke to Dr. Algie CofferKadakia for possible cardiac cath needed.  AKI versus chronic kidney disease: No prior labs available in chart to compare but she came in with creatinine of 1.7.  Unknown whether this is acute versus chronic.  Creatinine today is 1.7.  Avoid nephrotoxic agents..  Alcoholic hepatitis: Elevated LFTs very typical of alcoholic liver disease with AST being doubled in ALT.  Viral hepatitis panel negative.  Repeat labs in the morning.  Ultrasound abdomen.  Type 2 diabetes mellitus:  globin A1c only 6.5 without event taking any medications.  Blood sugar reasonably controlled.  Continue SSI.  Normochromic normocytic anemia/anemia of chronic disease:  Stable.  Monitor daily.  Thrombocytopenia: Initially platelets were 107 but currently they are within normal range now.  Previous history of stroke: On aspirin.  Polysubstance abuse: UDS positive for cocaine and cannabinoid.  Counseling provided.  Generalized weakness: We will consult PT OT.  DVT prophylaxis: Lovenox Code Status: Full code Family Communication: None present.  Patient alert and  oriented.  Discussed plan of care with the patient.  Answered questions. Disposition Plan: To be determined.  Consulting PT OT today.  Objective: Vitals:   11/18/18 1717 11/18/18 2127 11/19/18 0547 11/19/18 0830  BP: (!) 175/94 (!) 167/86 (!) 167/92 (!) 167/88  Pulse:  89 85   Resp:  18 18   Temp:  98 F (36.7 C) 98.3 F (36.8 C)   TempSrc:  Oral Oral   SpO2:  99% 100%   Weight:   61.8 kg   Height:        Intake/Output Summary (Last 24 hours) at 11/19/2018 0926 Last data filed at 11/19/2018 0919 Gross per 24 hour  Intake 840 ml  Output 300 ml  Net 540 ml   Filed Weights   11/18/18 0115 11/18/18 1449 11/19/18 0547  Weight: 61.7 kg 62.6 kg 61.8 kg    Consultants:   Cardiology 11/19/2018  Procedures:   None  Antimicrobials:   None  Objective: Vitals:   11/18/18 1717 11/18/18 2127 11/19/18 0547 11/19/18 0830  BP: (!) 175/94 (!) 167/86 (!) 167/92 (!) 167/88  Pulse:  89 85   Resp:  18 18   Temp:  98 F (36.7 C) 98.3 F (36.8 C)   TempSrc:  Oral Oral   SpO2:  99% 100%   Weight:   61.8 kg   Height:        Intake/Output Summary (Last 24 hours) at 11/19/2018 0926 Last data filed at 11/19/2018 0919 Gross per 24 hour  Intake 840 ml  Output 300 ml  Net 540 ml   Filed Weights   11/18/18 0115 11/18/18 1449 11/19/18 0547  Weight: 61.7 kg 62.6 kg 61.8 kg    Examination:  General exam: Appears calm and comfortable  Respiratory system: Fine crackles at the bases bilaterally, respiratory effort normal. Cardiovascular system: S1 & S2 heard, RRR. No JVD, murmurs, rubs, gallops or clicks. No pedal edema. Gastrointestinal system: Abdomen is nondistended, soft and nontender. No organomegaly or masses felt. Normal bowel sounds heard. Central nervous system: Alert and oriented. No focal neurological deficits. Extremities: Symmetric 5 x 5 power. Skin: No rashes, lesions or ulcers Psychiatry: Judgement and insight appear poor. Mood & affect appropriate.    Data Reviewed: I  have personally reviewed following labs and imaging studies  CBC: Recent Labs  Lab 11/18/18 0221 11/18/18 0735 11/19/18 0536  WBC 4.6 8.5 5.4  NEUTROABS 2.3  --   --   HGB 11.9* 11.3* 11.3*  HCT 39.2 35.8* 34.8*  MCV 98.2 95.0 92.3  PLT 107* 201 198   Basic Metabolic Panel: Recent Labs  Lab 11/18/18 0221 11/18/18 0735 11/19/18 0536  NA 138  --  138  K 3.8  --  3.5  CL 107  --  105  CO2 19*  --  24  GLUCOSE 335*  --  131*  BUN 22  --  16  CREATININE 1.74* 1.51* 1.17*  CALCIUM 8.0*  --  8.4*   GFR: Estimated Creatinine Clearance: 42 mL/min (A) (by C-G formula based on SCr of 1.17 mg/dL (H)). Liver Function Tests: Recent Labs  Lab 11/18/18 0221  AST 141*  ALT 75*  ALKPHOS 129*  BILITOT 0.4  PROT 7.3  ALBUMIN 3.3*   No results for input(s): LIPASE, AMYLASE in the last 168 hours. No results for input(s): AMMONIA in the last 168 hours. Coagulation Profile: No results for input(s): INR, PROTIME in the last 168 hours. Cardiac Enzymes: No results for input(s): CKTOTAL, CKMB, CKMBINDEX, TROPONINI in the last 168 hours. BNP (last 3 results) No results for input(s): PROBNP in the last 8760 hours. HbA1C: Recent Labs    11/18/18 0735  HGBA1C 6.5*   CBG: Recent Labs  Lab 11/18/18 0835 11/18/18 1253 11/18/18 1715 11/19/18 0807  GLUCAP 144* 102* 149* 199*   Lipid Profile: No results for input(s): CHOL, HDL, LDLCALC, TRIG, CHOLHDL, LDLDIRECT in the last 72 hours. Thyroid Function Tests: No results for input(s): TSH, T4TOTAL, FREET4, T3FREE, THYROIDAB in the last 72 hours. Anemia Panel: Recent Labs    11/18/18 0735 11/18/18 0736  VITAMINB12 292  --   FOLATE  --  14.3  FERRITIN 25  --   TIBC 412  --   IRON 35  --   RETICCTPCT 1.3  --    Sepsis Labs: No results for input(s): PROCALCITON, LATICACIDVEN in the last 168 hours.  Recent Results (from the past 240 hour(s))  SARS Coronavirus 2 [facility](Hospital order, [facility])  Nasopharyngeal Nasopharyngeal Swab     Status: None   Collection Time: 11/18/18  1:47 AM   Specimen: Nasopharyngeal Swab  Result Value Ref Range Status   SARS Coronavirus 2 NEGATIVE NEGATIVE Final    Comment: (NOTE) If result is NEGATIVE SARS-CoV-2 target nucleic acids are NOT DETECTED. The SARS-CoV-2 RNA is generally detectable in upper and lower  respiratory specimens during the acute phase of infection. The lowest  concentration of SARS-CoV-2 viral copies this assay can detect is 250  copies / mL. A negative result does not preclude SARS-CoV-2 infection  and should not be used as the sole basis for treatment or other  patient management decisions.  A negative result may occur with  improper specimen collection / handling, submission of specimen other  than nasopharyngeal swab, presence of viral mutation(s) within the  areas targeted by this assay, and inadequate number of viral copies  (<250 copies / mL). A negative result must be combined with clinical  observations, patient history, and epidemiological information. If result is POSITIVE SARS-CoV-2 target nucleic acids are DETECTED. The SARS-CoV-2 RNA is generally detectable in upper and lower  respiratory specimens dur ing the acute phase of infection.  Positive  results are indicative of active infection with SARS-CoV-2.  Clinical  correlation with patient history and other diagnostic information is  necessary to determine patient infection status.  Positive results do  not rule out bacterial infection or co-infection with other viruses. If result is PRESUMPTIVE POSTIVE SARS-CoV-2 nucleic acids MAY BE PRESENT.   A presumptive positive result was obtained on the submitted specimen  and confirmed on repeat testing.  While 2019 novel coronavirus  (SARS-CoV-2) nucleic acids may be present in the submitted sample  additional confirmatory testing may be necessary for epidemiological  and / or clinical management purposes  to  differentiate between  SARS-CoV-2 and other Sarbecovirus currently known to infect humans.  If clinically indicated additional testing with an alternate test  methodology [id number]) is advised. The SARS-CoV-2 RNA is generally  detectable in upper and lower respiratory sp ecimens during the acute  phase of infection. The expected result is Negative. Fact  Sheet for Patients:  StrictlyIdeas.no Fact Sheet for Healthcare Providers: BankingDealers.co.za This test is not yet approved or cleared by the [place] FDA and has been authorized for detection and/or diagnosis of SARS-CoV-2 by FDA under an Emergency Use Authorization (EUA).  This EUA will remain in effect (meaning this test can be used) for the duration of the COVID-19 declaration under Section 564(b)(1) of the Act, 21 U.S.C. section 360bbb-3(b)(1), unless the authorization is terminated or revoked sooner. Performed at [facility], [place] [address], [place], [place] [address]       Radiology Studies: [facility] 1 View  Result Date: 11/18/2018 CLINICAL DATA:  Shortness of breath EXAM: PORTABLE CHEST 1 VIEW COMPARISON:  None. FINDINGS: Cardiomegaly, vascular congestion. Diffuse interstitial prominence. Bilateral lower lobe airspace opacities. Findings most compatible with edema/CHF. No visible effusions or acute bony abnormality. IMPRESSION: Moderate edema/CHF. Electronically Signed   By: Rolm Baptise M.D.   On: 11/18/2018 01:52    Scheduled Meds:  amLODipine  10 mg Oral Daily   aspirin  325 mg Oral Daily   insulin aspart  0-9 Units Subcutaneous TID WC   Continuous Infusions:   LOS: 0 days   Time spent: 29 minutes   Darliss Cheney, MD [facility] Pager [phone]  If 7PM-7AM, please contact night-coverage [email or web address] Password [email or web address] 11/19/2018, 9:26 AM

## 2018-11-19 NOTE — Plan of Care (Signed)
  Problem: Pain Managment: Goal: General experience of comfort will improve Outcome: Progressing Note: Pain improved with heat packs and tylenol.

## 2018-11-19 NOTE — Consult Note (Signed)
Referring Physician:  Mykayla Brinton is an 68 y.o. female.                       Chief Complaint: Shortness of breath  HPI: 68 years old female with PMH of type 2 DM, hyperlipidemia, hypertension and remote history of peptic ulcer disease had shortness of breath with severely decreased LV systolic function and moderate MR. She had good diuresis with furosemide use. She denies chest pain. She quit smoking 5 years ago. She has CKD III.   Past Medical History:  Diagnosis Date  . Blood transfusion   . Diabetes mellitus ([place]) 2007  . High cholesterol 2007  . Hypertension   . Peptic ulcer 1965      Past Surgical History:  Procedure Laterality Date  . CESAREAN SECTION      Family History  Problem Relation Age of Onset  . Cancer Sister        Lung   . Cancer Brother        Throat   . Birth defects Son    Social History:  reports that she quit smoking about 5 years ago. Her smoking use included cigarettes. She quit after 25.00 years of use. She quit smokeless tobacco use about 5 years ago. She reports current alcohol use. She reports that she does not use drugs.  Allergies:  Allergies  Allergen Reactions  . Ace Inhibitors     Elevated Cr for 0.8 to 1.4 (30 percent or greater rise in serum creatinine)   . Aspirin Other (See Comments)    Irritates ulcers  . Codeine     "upsets myulcers"  . Hydrocodone     "upsets my ulcers"    Medications Prior to Admission  Medication Sig Dispense Refill  . aspirin 325 MG EC tablet Take 325 mg by mouth daily.    Marland Kitchen amLODipine (NORVASC) 10 MG tablet Take 1 tablet (10 mg total) by mouth at bedtime. (Patient not taking: Reported on 11/18/2018) 90 tablet 3  . citalopram (CELEXA) 20 MG tablet Take 1 tablet (20 mg total) by mouth daily. (Patient not taking: Reported on 11/18/2018) 30 tablet 3  . hydrochlorothiazide (HYDRODIURIL) 25 MG tablet TAKE ONE TABLET BY MOUTH EVERY DAY (Patient not taking: Reported on 11/18/2018) 90 tablet 3  . lovastatin  (MEVACOR) 40 MG tablet Take 1 tablet (40 mg total) by mouth at bedtime. (Patient not taking: Reported on 11/18/2018) 90 tablet 3  . traZODone (DESYREL) 50 MG tablet Take 0.5-1 tablets (25-50 mg total) by mouth at bedtime. (Patient not taking: Reported on 11/18/2018) 30 tablet 3    Results for orders placed or performed during the hospital encounter of 11/18/18 (from the past 48 hour(s))  SARS Coronavirus 2 [facility] order, Performed in [facility] hospital lab) Nasopharyngeal Nasopharyngeal Swab     Status: None   Collection Time: 11/18/18  1:47 AM   Specimen: Nasopharyngeal Swab  Result Value Ref Range   SARS Coronavirus 2 NEGATIVE NEGATIVE    Comment: (NOTE) If result is NEGATIVE SARS-CoV-2 target nucleic acids are NOT DETECTED. The SARS-CoV-2 RNA is generally detectable in upper and lower  respiratory specimens during the acute phase of infection. The lowest  concentration of SARS-CoV-2 viral copies this assay can detect is 250  copies / mL. A negative result does not preclude SARS-CoV-2 infection  and should not be used as the sole basis for treatment or other  patient management decisions.  A negative result may occur  with  improper specimen collection / handling, submission of specimen other  than nasopharyngeal swab, presence of viral mutation(s) within the  areas targeted by this assay, and inadequate number of viral copies  (<250 copies / mL). A negative result must be combined with clinical  observations, patient history, and epidemiological information. If result is POSITIVE SARS-CoV-2 target nucleic acids are DETECTED. The SARS-CoV-2 RNA is generally detectable in upper and lower  respiratory specimens dur ing the acute phase of infection.  Positive  results are indicative of active infection with SARS-CoV-2.  Clinical  correlation with patient history and other diagnostic information is  necessary to determine patient infection status.  Positive results do  not rule out  bacterial infection or co-infection with other viruses. If result is PRESUMPTIVE POSTIVE SARS-CoV-2 nucleic acids MAY BE PRESENT.   A presumptive positive result was obtained on the submitted specimen  and confirmed on repeat testing.  While 2019 novel coronavirus  (SARS-CoV-2) nucleic acids may be present in the submitted sample  additional confirmatory testing may be necessary for epidemiological  and / or clinical management purposes  to differentiate between  SARS-CoV-2 and other Sarbecovirus currently known to infect humans.  If clinically indicated additional testing with an alternate test  methodology [phone]) is advised. The SARS-CoV-2 RNA is generally  detectable in upper and lower respiratory sp ecimens during the acute  phase of infection. The expected result is Negative. Fact Sheet for Patients:  BoilerBrush.com.cy Fact Sheet for Healthcare Providers: https://pope.com/ This test is not yet approved or cleared by the [place] FDA and has been authorized for detection and/or diagnosis of SARS-CoV-2 by FDA under an Emergency Use Authorization (EUA).  This EUA will remain in effect (meaning this test can be used) for the duration of the COVID-19 declaration under Section 564(b)(1) of the Act, 21 U.S.C. section 360bbb-3(b)(1), unless the authorization is terminated or revoked sooner. Performed at [facility] Lab, [address], [place], [place] [address]   Comprehensive metabolic panel     Status: Abnormal   Collection Time: 11/18/18  2:21 AM  Result Value Ref Range   Sodium 138 135 - 145 mmol/L   Potassium 3.8 3.5 - 5.1 mmol/L   Chloride 107 98 - 111 mmol/L   CO2 19 (L) 22 - 32 mmol/L   Glucose, Bld 335 (H) 70 - 99 mg/dL   BUN 22 8 - 23 mg/dL   Creatinine, Ser 8.11 (H) 0.44 - 1.00 mg/dL   Calcium 8.0 (L) 8.9 - 10.3 mg/dL   Total Protein 7.3 6.5 - 8.1 g/dL   Albumin 3.3 (L) 3.5 - 5.0 g/dL   AST 914 (H) 15 - 41  U/L   ALT 75 (H) 0 - 44 U/L   Alkaline Phosphatase 129 (H) 38 - 126 U/L   Total Bilirubin 0.4 0.3 - 1.2 mg/dL   GFR calc non Af Amer 30 (L) >60 mL/min   GFR calc Af Amer 35 (L) >60 mL/min   Anion gap 12 5 - 15    Comment: Performed at [facility] Lab, [address] [place], [place] [address]  Troponin I (High Sensitivity)     Status: Abnormal   Collection Time: 11/18/18  2:21 AM  Result Value Ref Range   Troponin I (High Sensitivity) 52 (H) <18 ng/L    Comment: (NOTE) Elevated high sensitivity troponin I (hsTnI) values and significant  changes across serial measurements may suggest ACS but many other  chronic and acute conditions are  known to elevate hsTnI results.  Refer to the "Links" section for chest pain algorithms and additional  guidance. Performed at [facility], [address] [place], [place] [address]   Brain natriuretic peptide     Status: Abnormal   Collection Time: 11/18/18  2:21 AM  Result Value Ref Range   B Natriuretic Peptide 544.4 (H) 0.0 - 100.0 pg/mL    Comment: Performed at [facility], [address] [place], [place] [address]  CBC with Differential     Status: Abnormal   Collection Time: 11/18/18  2:21 AM  Result Value Ref Range   WBC 4.6 4.0 - 10.5 K/uL   RBC 3.99 3.87 - 5.11 MIL/uL   Hemoglobin 11.9 (L) 12.0 - 15.0 g/dL   HCT 82.939.2 56.236.0 - 13.046.0 %   MCV 98.2 80.0 - 100.0 fL   MCH 29.8 26.0 - 34.0 pg   MCHC 30.4 30.0 - 36.0 g/dL   RDW 86.514.2 78.411.5 - 69.615.5 %   Platelets 107 (L) 150 - 400 K/uL    Comment: REPEATED TO VERIFY PLATELET COUNT CONFIRMED BY SMEAR SPECIMEN CHECKED FOR CLOTS Immature Platelet Fraction may be clinically indicated, consider ordering this additional test [id number]    nRBC 0.0 0.0 - 0.2 %   Neutrophils Relative % 49 %   Neutro Abs 2.3 1.7 - 7.7 K/uL   Lymphocytes Relative 40 %   Lymphs Abs 1.8 0.7 - 4.0 K/uL   Monocytes Relative 7 %   Monocytes Absolute 0.3 0.1 - 1.0 K/uL   Eosinophils Relative 2 %    Eosinophils Absolute 0.1 0.0 - 0.5 K/uL   Basophils Relative 1 %   Basophils Absolute 0.0 0.0 - 0.1 K/uL   Immature Granulocytes 1 %   Abs Immature Granulocytes 0.04 0.00 - 0.07 K/uL    Comment: Performed at [facility], [address] [place], [place] [address]  Troponin I (High Sensitivity)     Status: Abnormal   Collection Time: 11/18/18  5:32 AM  Result Value Ref Range   Troponin I (High Sensitivity) 224 (HH) <18 ng/L    Comment: CRITICAL RESULT CALLED TO, READ BACK BY AND VERIFIED WITH: BROOKS,M RN 11/18/2018 0635 JORDANS (NOTE) Elevated high sensitivity troponin I (hsTnI) values and significant  changes across serial measurements may suggest ACS but many other  chronic and acute conditions are known to elevate hsTnI results.  Refer to the Links section for chest pain algorithms and additional  guidance. Performed at [facility], [address] [place], [place] [address]   Acetaminophen level     Status: Abnormal   Collection Time: 11/18/18  7:34 AM  Result Value Ref Range   Acetaminophen (Tylenol), Serum <10 (L) 10 - 30 ug/mL    Comment: (NOTE) Therapeutic concentrations vary significantly. A range of 10-30 ug/mL  may be an effective concentration for many patients. However, some  are best treated at concentrations outside of this range. Acetaminophen concentrations >150 ug/mL at 4 hours after ingestion  and >50 ug/mL at 12 hours after ingestion are often associated with  toxic reactions. Performed at [facility], [address] [place], [place] [address]   Hepatitis panel, acute     Status: None   Collection Time: 11/18/18  7:35 AM  Result Value Ref Range   Hepatitis B Surface Ag Negative Negative   HCV Ab <0.1 0.0 - 0.9 s/co ratio    Comment: (NOTE)  Negative:     < 0.8                             Indeterminate: 0.8 - 0.9                                  Positive:     > 0.9 The CDC recommends that a positive  HCV antibody result be followed up with a HCV Nucleic Acid Amplification test ([id number] Performed At: [facility] [address] [place], [place] [id number] Jolene SchimkeNagendra Sanjai MD BJ:[id number]:[phone]    Hep A IgM Negative Negative   Hep B C IgM Negative Negative  Hemoglobin A1c     Status: Abnormal   Collection Time: 11/18/18  7:35 AM  Result Value Ref Range   Hgb A1c MFr Bld 6.5 (H) 4.8 - 5.6 %    Comment: (NOTE) Pre diabetes:          5.7%-6.4% Diabetes:              >6.4% Glycemic control for   <7.0% adults with diabetes    Mean Plasma Glucose 139.85 mg/dL    Comment: Performed at [facility], [address] [place], [place] [address]  HIV antibody (Routine Testing)     Status: None   Collection Time: 11/18/18  7:35 AM  Result Value Ref Range   HIV Screen 4th Generation wRfx Non Reactive Non Reactive    Comment: (NOTE) Performed At: [facility] [address] [place], [place] [id number] Jolene SchimkeNagendra Sanjai MD WU:[id number]:[phone]   CBC     Status: Abnormal   Collection Time: 11/18/18  7:35 AM  Result Value Ref Range   WBC 8.5 4.0 - 10.5 K/uL   RBC 3.77 (L) 3.87 - 5.11 MIL/uL   Hemoglobin 11.3 (L) 12.0 - 15.0 g/dL   HCT 25.335.8 (L) 66.436.0 - 40.346.0 %   MCV 95.0 80.0 - 100.0 fL   MCH 30.0 26.0 - 34.0 pg   MCHC 31.6 30.0 - 36.0 g/dL   RDW 47.414.2 25.911.5 - 56.315.5 %   Platelets 201 150 - 400 K/uL   nRBC 0.0 0.0 - 0.2 %    Comment: Performed at [facility], [address] [place], [place] [address]  Creatinine, serum     Status: Abnormal   Collection Time: 11/18/18  7:35 AM  Result Value Ref Range   Creatinine, Ser 1.51 (H) 0.44 - 1.00 mg/dL   GFR calc non Af Amer 35 (L) >60 mL/min   GFR calc Af Amer 41 (L) >60 mL/min    Comment: Performed at [facility], [address] [place], [place] [address]  Vitamin B12     Status: None   Collection Time: 11/18/18  7:35 AM  Result Value Ref Range   Vitamin B-12 292 180 - 914 pg/mL    Comment: (NOTE) This assay is not  validated for testing neonatal or myeloproliferative syndrome specimens for Vitamin B12 levels. Performed at [facility], [address] [place], [place] [address]   Iron and TIBC     Status: Abnormal   Collection Time: 11/18/18  7:35 AM  Result Value Ref Range   Iron 35 28 - 170 ug/dL   TIBC 660412 630250 - 160450 ug/dL   Saturation Ratios 9 (L) 10.4 - 31.8 %   UIBC 377 ug/dL  Comment: Performed at [facility], [address], [place], [place] [address]  Ferritin     Status: None   Collection Time: 11/18/18  7:35 AM  Result Value Ref Range   Ferritin 25 11 - 307 ng/mL    Comment: Performed at [facility], [address] [place], [place] [address]  Reticulocytes     Status: Abnormal   Collection Time: 11/18/18  7:35 AM  Result Value Ref Range   Retic Ct Pct 1.3 0.4 - 3.1 %   RBC. 3.77 (L) 3.87 - 5.11 MIL/uL   Retic Count, Absolute 47.1 19.0 - 186.0 K/uL   Immature Retic Fract 12.7 2.3 - 15.9 %    Comment: Performed at [facility], [address] [place], [place] [address]  Folate     Status: None   Collection Time: 11/18/18  7:36 AM  Result Value Ref Range   Folate 14.3 >5.9 ng/mL    Comment: Performed at [facility], [address] [place], [place] [address]  CBG monitoring, ED     Status: Abnormal   Collection Time: 11/18/18  8:35 AM  Result Value Ref Range   Glucose-Capillary 144 (H) 70 - 99 mg/dL   Comment 1 Notify RN    Comment 2 Document in Chart   Urinalysis, Routine w reflex microscopic     Status: Abnormal   Collection Time: 11/18/18  9:10 AM  Result Value Ref Range   Color, Urine YELLOW YELLOW   APPearance CLEAR CLEAR   Specific Gravity, Urine 1.009 1.005 - 1.030   pH 5.0 5.0 - 8.0   Glucose, UA NEGATIVE NEGATIVE mg/dL   Hgb urine dipstick SMALL (A) NEGATIVE   Bilirubin Urine NEGATIVE NEGATIVE   Ketones, ur NEGATIVE NEGATIVE mg/dL   Protein, ur NEGATIVE NEGATIVE mg/dL   Nitrite POSITIVE (A) NEGATIVE   Leukocytes,Ua TRACE (A)  NEGATIVE   RBC / HPF 0-5 0 - 5 RBC/hpf   WBC, UA 0-5 0 - 5 WBC/hpf   Bacteria, UA MANY (A) NONE SEEN   Squamous Epithelial / LPF 0-5 0 - 5   Mucus PRESENT    Hyaline Casts, UA PRESENT     Comment: Performed at [facility], [address], [place], [place] [address]  Urine rapid drug screen (hosp performed)     Status: Abnormal   Collection Time: 11/18/18  9:11 AM  Result Value Ref Range   Opiates NONE DETECTED NONE DETECTED   Cocaine POSITIVE (A) NONE DETECTED   Benzodiazepines NONE DETECTED NONE DETECTED   Amphetamines NONE DETECTED NONE DETECTED   Tetrahydrocannabinol POSITIVE (A) NONE DETECTED   Barbiturates NONE DETECTED NONE DETECTED    Comment: (NOTE) DRUG SCREEN FOR MEDICAL PURPOSES ONLY.  IF CONFIRMATION IS NEEDED FOR ANY PURPOSE, NOTIFY LAB WITHIN 5 DAYS. LOWEST DETECTABLE LIMITS FOR URINE DRUG SCREEN Drug Class                     Cutoff (ng/mL) Amphetamine and metabolites    1000 Barbiturate and metabolites    200 Benzodiazepine                 200 Tricyclics and metabolites     300 Opiates and metabolites        300 Cocaine and metabolites        300 THC  50 Performed at [facility], [address], [place], [place] [address]   CBG monitoring, ED     Status: Abnormal   Collection Time: 11/18/18 12:53 PM  Result Value Ref Range   Glucose-Capillary 102 (H) 70 - 99 mg/dL   Comment 1 Notify RN    Comment 2 Document in Chart   Glucose, capillary     Status: Abnormal   Collection Time: 11/18/18  5:15 PM  Result Value Ref Range   Glucose-Capillary 149 (H) 70 - 99 mg/dL  Basic metabolic panel     Status: Abnormal   Collection Time: 11/19/18  5:36 AM  Result Value Ref Range   Sodium 138 135 - 145 mmol/L   Potassium 3.5 3.5 - 5.1 mmol/L   Chloride 105 98 - 111 mmol/L   CO2 24 22 - 32 mmol/L   Glucose, Bld 131 (H) 70 - 99 mg/dL   BUN 16 8 - 23 mg/dL   Creatinine, Ser 6.04 (H) 0.44 - 1.00 mg/dL   Calcium 8.4 (L) 8.9 - 10.3  mg/dL   GFR calc non Af Amer 48 (L) >60 mL/min   GFR calc Af Amer 56 (L) >60 mL/min   Anion gap 9 5 - 15    Comment: Performed at [facility], [address] [place], [place] [address]  CBC     Status: Abnormal   Collection Time: 11/19/18  5:36 AM  Result Value Ref Range   WBC 5.4 4.0 - 10.5 K/uL   RBC 3.77 (L) 3.87 - 5.11 MIL/uL   Hemoglobin 11.3 (L) 12.0 - 15.0 g/dL   HCT 11.9 (L) 14.7 - 82.9 %   MCV 92.3 80.0 - 100.0 fL   MCH 30.0 26.0 - 34.0 pg   MCHC 32.5 30.0 - 36.0 g/dL   RDW 56.2 13.0 - 86.5 %   Platelets 198 150 - 400 K/uL   nRBC 0.0 0.0 - 0.2 %    Comment: Performed at [facility], [address] [place], [place] [address]  Glucose, capillary     Status: Abnormal   Collection Time: 11/19/18  8:07 AM  Result Value Ref Range   Glucose-Capillary 199 (H) 70 - 99 mg/dL   Dg Chest Port 1 View  Result Date: 11/18/2018 CLINICAL DATA:  Shortness of breath EXAM: PORTABLE CHEST 1 VIEW COMPARISON:  None. FINDINGS: Cardiomegaly, vascular congestion. Diffuse interstitial prominence. Bilateral lower lobe airspace opacities. Findings most compatible with edema/CHF. No visible effusions or acute bony abnormality. IMPRESSION: Moderate edema/CHF. Electronically Signed   By: Charlett Nose M.D.   On: 11/18/2018 01:52    Review Of Systems Constitutional: No fever, chills, weight loss or gain. Eyes: No vision change, wears glasses. No discharge or pain. Ears: No hearing loss, No tinnitus. Respiratory: No asthma, COPD, pneumonias. Positive shortness of breath. No hemoptysis. Cardiovascular: Positive chest pain, palpitation, leg edema. Gastrointestinal: No nausea, vomiting, diarrhea, constipation. No GI bleed. No hepatitis. Genitourinary: No dysuria, hematuria, kidney stone. No incontinance. Neurological: No headache, stroke, seizures.  Psychiatry: No psych facility admission for anxiety, depression, suicide. No detox. Skin: No rash. Musculoskeletal: Positive joint pain,  fibromyalgia, neck pain, back pain. Lymphadenopathy: No lymphadenopathy. Hematology: No anemia or easy bruising.   Blood pressure (!) 167/88, pulse 85, temperature 98.3 F (36.8 C), temperature source Oral, resp. rate 18, height 5\' 5"  (1.651 m), weight 61.8 kg, SpO2 100 %. Body mass index is 22.68 kg/m. General appearance: alert, cooperative, appears stated age and no  distress Head: Normocephalic, atraumatic. Eyes: Brown eyes, pink conjunctiva, corneas clear.  Neck: No adenopathy, no carotid bruit, no JVD, supple, symmetrical, trachea midline and thyroid not enlarged. Resp: Clearing to auscultation bilaterally. Cardio: Regular rate and rhythm, S1, S2 normal, II/VI systolic murmur, no click, rub or gallop GI: Soft, non-tender; bowel sounds normal; no organomegaly. Extremities: No edema, cyanosis or clubbing. Skin: Warm and dry.  Neurologic: Alert and oriented X 3, normal strength. Normal coordination and gait.  Assessment/Plan Acute systolic left heart failure, HFrEF Hypertension Type 2 DM CKD, III Hyperlipidemia Moderate MR CKD, III  R + L heart catheterization. Continue diuresis as tolerated  Time spent: Review of old records, Lab, x-rays, EKG, other cardiac tests, examination, discussion with patient over 70 minutes.  Ricki RodriguezAjay S Alaena Strader, MD  11/19/2018, 10:13 AM

## 2018-11-20 DIAGNOSIS — I5041 Acute combined systolic (congestive) and diastolic (congestive) heart failure: Secondary | ICD-10-CM | POA: Diagnosis present

## 2018-11-20 LAB — CBC
HCT: 34.6 % — ABNORMAL LOW (ref 36.0–46.0)
Hemoglobin: 11.4 g/dL — ABNORMAL LOW (ref 12.0–15.0)
MCH: 30 pg (ref 26.0–34.0)
MCHC: 32.9 g/dL (ref 30.0–36.0)
MCV: 91.1 fL (ref 80.0–100.0)
Platelets: 199 10*3/uL (ref 150–400)
RBC: 3.8 MIL/uL — ABNORMAL LOW (ref 3.87–5.11)
RDW: 14.6 % (ref 11.5–15.5)
WBC: 5 10*3/uL (ref 4.0–10.5)
nRBC: 0 % (ref 0.0–0.2)

## 2018-11-20 LAB — GLUCOSE, CAPILLARY
Glucose-Capillary: 193 mg/dL — ABNORMAL HIGH (ref 70–99)
Glucose-Capillary: 77 mg/dL (ref 70–99)

## 2018-11-20 LAB — BASIC METABOLIC PANEL
Anion gap: 10 (ref 5–15)
BUN: 10 mg/dL (ref 8–23)
CO2: 24 mmol/L (ref 22–32)
Calcium: 8.4 mg/dL — ABNORMAL LOW (ref 8.9–10.3)
Chloride: 105 mmol/L (ref 98–111)
Creatinine, Ser: 1.05 mg/dL — ABNORMAL HIGH (ref 0.44–1.00)
GFR calc Af Amer: >60 mL/min (ref >60)
GFR calc non Af Amer: 55 mL/min — ABNORMAL LOW (ref >60)
Glucose, Bld: 111 mg/dL — ABNORMAL HIGH (ref 70–99)
Potassium: 3.5 mmol/L (ref 3.5–5.1)
Sodium: 139 mmol/L (ref 135–145)

## 2018-11-20 MED ORDER — ATORVASTATIN CALCIUM 40 MG PO TABS: 40.0000 mg | ORAL_TABLET | Freq: Every day | ORAL | 1 refills | Status: DC

## 2018-11-20 MED ORDER — AMLODIPINE BESYLATE 10 MG PO TABS: 10.0000 mg | ORAL_TABLET | Freq: Every day | ORAL | 1 refills | Status: DC

## 2018-11-20 MED ORDER — ALUM & MAG HYDROXIDE-SIMETH 200-200-20 MG/5ML PO SUSP
30.0000 mL | ORAL | Status: DC | PRN
  Administered 2018-11-20: 30 mL via ORAL
  Filled 2018-11-20: qty 30

## 2018-11-20 MED ORDER — NICOTINE 21 MG/24HR TD PT24
21.0000 mg | MEDICATED_PATCH | Freq: Every day | TRANSDERMAL | Status: DC
  Administered 2018-11-20: 21 mg via TRANSDERMAL
  Filled 2018-11-20: qty 1

## 2018-11-20 MED ORDER — ASPIRIN EC 81 MG PO TBEC: 81.0000 mg | DELAYED_RELEASE_TABLET | Freq: Every day | ORAL | 1 refills | Status: AC

## 2018-11-20 MED ORDER — FUROSEMIDE 20 MG PO TABS: 20.0000 mg | ORAL_TABLET | Freq: Every day | ORAL | 0 refills | Status: DC

## 2018-11-20 MED ORDER — CARVEDILOL 3.125 MG PO TABS: 3.1250 mg | ORAL_TABLET | Freq: Two times a day (BID) | ORAL | 1 refills | Status: DC

## 2018-11-20 MED ORDER — NICOTINE 21 MG/24HR TD PT24: 21.0000 mg | MEDICATED_PATCH | Freq: Every day | TRANSDERMAL | 0 refills | Status: DC

## 2018-11-20 MED ORDER — CLOPIDOGREL BISULFATE 75 MG PO TABS: 75.0000 mg | ORAL_TABLET | Freq: Every day | ORAL | 1 refills | Status: AC

## 2018-11-20 MED ORDER — PANTOPRAZOLE SODIUM 40 MG PO TBEC: 40.0000 mg | DELAYED_RELEASE_TABLET | Freq: Every day | ORAL | 0 refills | Status: DC

## 2018-11-20 MED ORDER — PANTOPRAZOLE SODIUM 40 MG PO TBEC
40.0000 mg | DELAYED_RELEASE_TABLET | Freq: Every day | ORAL | Status: DC
  Administered 2018-11-20: 40 mg via ORAL
  Filled 2018-11-20: qty 1

## 2018-11-20 MED ORDER — CARVEDILOL 3.125 MG PO TABS
3.1250 mg | ORAL_TABLET | Freq: Two times a day (BID) | ORAL | Status: DC
  Administered 2018-11-20: 3.125 mg via ORAL
  Filled 2018-11-20: qty 1

## 2018-11-20 NOTE — Consult Note (Signed)
Ref: Patient, No Pcp Per   Subjective:  Feeling better. VS stable. Right groin negative for hematoma or discharge. Patient wants nicotine patch and wants to quit smoking.  Objective:  Vital Signs in the last 24 hours: Temp:  [98 F (36.7 C)-98.3 F (36.8 C)] 98.3 F (36.8 C) (08/08 0458) Pulse Rate:  [0-103] 84 (08/07 2021) Cardiac Rhythm: Normal sinus rhythm (08/07 1900) Resp:  [0-24] 16 (08/08 0458) BP: (142-219)/(57-126) 150/57 (08/08 0500) SpO2:  [0 %-100 %] 99 % (08/08 0458) Weight:  [61.5 kg] 61.5 kg (08/08 0458)  Physical Exam: BP Readings from Last 1 Encounters:  11/20/18 (!) 150/57     Wt Readings from Last 1 Encounters:  11/20/18 61.5 kg    Weight change: -1.134 kg Body mass index is 22.55 kg/m. HEENT: [place]/AT, Eyes-Brown, Conjunctiva-Pink, Sclera-Non-icteric Neck: No JVD, No bruit, Trachea midline. Lungs:  Clear, Bilateral. Cardiac:  Regular rhythm, normal S1 and S2, no S3. III/VI systolic murmur. Abdomen:  Soft, non-tender. BS present. Extremities:  No edema present. No cyanosis. No clubbing. No swelling or tenderness of right groin. CNS: AxOx3, Cranial nerves grossly intact, moves all 4 extremities.  Skin: Warm and dry.   Intake/Output from previous day: 08/07 0701 - 08/08 0700 In: 1397.4 [P.O.:720; I.V.:677.4] Out: 2000 [Urine:2000]    Lab Results: BMET    Component Value Date/Time   NA 139 11/20/2018 0402   NA 142 11/19/2018 1527   NA 142 11/19/2018 1523   K 3.5 11/20/2018 0402   K 3.4 (L) 11/19/2018 1527   K 3.4 (L) 11/19/2018 1523   CL 105 11/20/2018 0402   CL 105 11/19/2018 0536   CL 107 11/18/2018 0221   CO2 24 11/20/2018 0402   CO2 24 11/19/2018 0536   CO2 19 (L) 11/18/2018 0221   GLUCOSE 111 (H) 11/20/2018 0402   GLUCOSE 131 (H) 11/19/2018 0536   GLUCOSE 335 (H) 11/18/2018 0221   BUN 10 11/20/2018 0402   BUN 16 11/19/2018 0536   BUN 22 11/18/2018 0221   CREATININE 1.05 (H) 11/20/2018 0402   CREATININE 1.17 (H) 11/19/2018 0536   CREATININE 1.51 (H) 11/18/2018 0735   CREATININE 0.80 01/20/2013 0936   CREATININE 1.09 12/30/2011 1359   CREATININE 1.40 (H) 12/18/2011 1444   CALCIUM 8.4 (L) 11/20/2018 0402   CALCIUM 8.4 (L) 11/19/2018 0536   CALCIUM 8.0 (L) 11/18/2018 0221   GFRNONAA 55 (L) 11/20/2018 0402   GFRNONAA 48 (L) 11/19/2018 0536   GFRNONAA 35 (L) 11/18/2018 0735   GFRAA >60 11/20/2018 0402   GFRAA 56 (L) 11/19/2018 0536   GFRAA 41 (L) 11/18/2018 0735   CBC    Component Value Date/Time   WBC 5.0 11/20/2018 0402   RBC 3.80 (L) 11/20/2018 0402   HGB 11.4 (L) 11/20/2018 0402   HCT 34.6 (L) 11/20/2018 0402   PLT 199 11/20/2018 0402   MCV 91.1 11/20/2018 0402   MCH 30.0 11/20/2018 0402   MCHC 32.9 11/20/2018 0402   RDW 14.6 11/20/2018 0402   LYMPHSABS 1.8 11/18/2018 0221   MONOABS 0.3 11/18/2018 0221   EOSABS 0.1 11/18/2018 0221   BASOSABS 0.0 11/18/2018 0221   HEPATIC Function Panel Recent Labs    11/18/18 0221  PROT 7.3   HEMOGLOBIN A1C No components found for: HGA1C,  MPG CARDIAC ENZYMES No results found for: CKTOTAL, CKMB, CKMBINDEX, TROPONINI BNP No results for input(s): PROBNP in the last 8760 hours. TSH No results for input(s): TSH in the last 8760 hours. CHOLESTEROL No results for  input(s): CHOL in the last 8760 hours.  Scheduled Meds: . amLODipine  10 mg Oral Daily  . aspirin  325 mg Oral Daily  . clopidogrel  75 mg Oral Q breakfast  . insulin aspart  0-9 Units Subcutaneous TID WC  . nicotine  21 mg Transdermal Daily  . pantoprazole  40 mg Oral Q1200  . sodium chloride flush  3 mL Intravenous Q12H   Continuous Infusions: . sodium chloride     PRN Meds:.sodium chloride, acetaminophen **OR** acetaminophen, alum & mag hydroxide-simeth, hydrALAZINE, ondansetron **OR** ondansetron (ZOFRAN) IV, sodium chloride flush, traZODone  Assessment/Plan: Acute systolic left heart failure, HFrEF Hypertension Type 2 DM CKD, II Hyperlipidemia Moderate MR  Add small dose B-blocker.  Continue Aspirin, Plavix with Pantoprazole.  Hold ACE-I for now due to allergy Increase activity. F/U 1 week..    LOS: 1 day   Time spent including chart review, lab review, examination, discussion with patient : 30 min   Orpah CobbAjay Benford Asch  MD  11/20/2018, 7:39 AM

## 2018-11-20 NOTE — Discharge Summary (Signed)
Physician Discharge Summary  Lisa RichardsWillie Mae Scripter NWG:[id number]:[id number] DOB: 13-Apr-1951 DOA: 11/18/2018  PCP: Patient, No Pcp Per  Admit date: 11/18/2018 Discharge date: 11/20/2018  Admitted From: Home Disposition: Home  Recommendations for Outpatient Follow-up:  1. Follow up with PCP in 1-2 weeks 2. Follow-up with cardiology in 1 week 3. Please obtain BMP/CBC in one week 4. Please follow up on the following pending results:  Home Health: None Equipment/Devices: None  Discharge Condition: Stable CODE STATUS: Full code Diet recommendation: Low-sodium diet  Subjective: Patient seen and examined.  She had no complaint.  Feels much better and stronger.  Agreeable to go home today.  Brief/Interim Summary: Lisa RichardsWillie Mae Herbinis a 68 y.o.femalewithhistory of hypertension diabetes mellitus type 2 has not been to a physician for more than 5 years presented to the ER with sudden onset of shortness of breath which started night before presentation when patient was going to the bed.  She did not have any chest pain or cough or chills. Smokes cigarettes every day and drinks alcohol about 60 ounces on the weekend.   Upon arrival to ER patient blood pressure was found to be markedly elevated with systolic in the 254-141 labs show creatinine 1.7 hemoglobin 11.9 platelets 107 blood sugar 335 bicarb 19 anion gap 12 AST was 141 ALT of 75 chest x-ray was showing pulmonary edema. EKG was showing sinus tachycardia. Patient was given labetalol 20 mg IV for blood pressure control following which blood pressure improved. Lasix 40 mg IV was given for acute CHF. Patient was placed on nitroglycerin patch. Patient admitted under hospitalist service for further management.  She had slightly elevated troponin but no chest pain or shortness of breath.  After she received Lasix in the ER, she felt much better and was off of the oxygen without any dyspnea.  Echo showed significant systolic as well as diastolic congestive heart  failure with ejection fraction of 25% and moderate MR.  Cardiology consulted today for possible cath.  She underwent cardiac cath on 11/19/2018 and was found to have diffuse multivessel disease which was not amenable for any stents.  Cardiology did not recommend any CABG either.  She is doing well post catheter today.  She has been started on aspirin, Plavix, carvedilol as well as omeprazole by cardiology and she was already started on amlodipine 10 mg to control her blood pressure.  Her blood pressure is much better now with systolic being around 150.  Her renal function has improved with creatinine from 1.74 to 1.05.  Apparently, patient is allergic to ACE inhibitor's.  Dr. Algie CofferKadakia spoke to patient about starting her on ARB but patient is hesitant at this point in time since she has been told in the past that it could hurt her kidneys.  Dr. Algie CofferKadakia plans to see her in the office in 1 week and we will try to convince her to start on 1 of those and I hope that it will help controlling her blood pressure.  I have spent extended amount of time counseling her about quitting smoking, quitting polysubstance abuse such as marijuana and cocaine which was positive in her UDS and sticking to low-sodium diet.  She verbalized understanding.  She is also been prescribed nicotine patches.  For her diabetes, she was on SSI while in the hospital.  Hemoglobin A1c without taking any medications was 6.5.  She will benefit from metformin however since she had her cardiac cath done just yesterday and the fact that she has been unreliable, I am  hesitant to start her on metformin now however I have advised her to establish a relationship with PCP.  I recommend checking her BMP which Dr. Algie Coffer plans to check at his visit and then starting around at least low-dose metformin if creatinine is improved.  She is being discharged today in stable condition.  She was also evaluated by PT OT who did not recommend any further services.  Discharge  Diagnoses:  Principal Problem:   Hypertensive urgency Active Problems:   Essential hypertension   ARF (acute renal failure) (HCC)   Diabetes mellitus type 2 in nonobese (HCC)   Normocytic normochromic anemia   Tobacco abuse   Acute combined systolic and diastolic congestive heart failure [facility])    Discharge Instructions  Discharge Instructions    Discharge patient   Complete by: As directed    Discharge disposition: 01-Home or Self Care   Discharge patient date: 11/20/2018     Allergies as of 11/20/2018      Reactions   Ace Inhibitors    Elevated Cr for 0.8 to 1.4 (30 percent or greater rise in serum creatinine)    Aspirin Other (See Comments)   Irritates ulcers   Codeine    "upsets myulcers"   Hydrocodone    "upsets my ulcers"      Medication List    STOP taking these medications   hydrochlorothiazide 25 MG tablet Commonly known as: HYDRODIURIL   lovastatin 40 MG tablet Commonly known as: MEVACOR     TAKE these medications   amLODipine 10 MG tablet Commonly known as: NORVASC Take 1 tablet (10 mg total) by mouth at bedtime.   aspirin EC 81 MG tablet Take 1 tablet (81 mg total) by mouth daily. What changed:   medication strength  how much to take   atorvastatin 40 MG tablet Commonly known as: Lipitor Take 1 tablet (40 mg total) by mouth daily.   carvedilol 3.125 MG tablet Commonly known as: COREG Take 1 tablet (3.125 mg total) by mouth 2 (two) times daily with a meal.   citalopram 20 MG tablet Commonly known as: CELEXA Take 1 tablet (20 mg total) by mouth daily.   clopidogrel 75 MG tablet Commonly known as: PLAVIX Take 1 tablet (75 mg total) by mouth daily with breakfast.   furosemide 20 MG tablet Commonly known as: Lasix Take 1 tablet (20 mg total) by mouth daily.   nicotine 21 mg/24hr patch Commonly known as: NICODERM CQ - dosed in mg/24 hours Place 1 patch (21 mg total) onto the skin daily.   pantoprazole 40 MG tablet Commonly known as:  PROTONIX Take 1 tablet (40 mg total) by mouth daily at 12 noon.   traZODone 50 MG tablet Commonly known as: DESYREL Take 0.5-1 tablets (25-50 mg total) by mouth at bedtime.      Follow-up Information    Orpah Cobb, MD. Call in 1 week(s).   Specialty: Cardiology Contact information: [address] [place] [address] [phone]          Allergies  Allergen Reactions  . Ace Inhibitors     Elevated Cr for 0.8 to 1.4 (30 percent or greater rise in serum creatinine)   . Aspirin Other (See Comments)    Irritates ulcers  . Codeine     "upsets myulcers"  . Hydrocodone     "upsets my ulcers"    Consultations: Cardiology   Procedures/Studies: Dg Chest Port 1 View  Result Date: 11/18/2018 CLINICAL DATA:  Shortness of breath EXAM:  PORTABLE CHEST 1 VIEW COMPARISON:  None. FINDINGS: Cardiomegaly, vascular congestion. Diffuse interstitial prominence. Bilateral lower lobe airspace opacities. Findings most compatible with edema/CHF. No visible effusions or acute bony abnormality. IMPRESSION: Moderate edema/CHF. Electronically Signed   By: Charlett NoseKevin  Dover M.D.   On: 11/18/2018 01:52   [place] Abdomen Limited Ruq  Result Date: 11/19/2018 CLINICAL DATA:  Elevated liver function studies. EXAM: ULTRASOUND ABDOMEN LIMITED RIGHT UPPER QUADRANT COMPARISON:  Abdominopelvic CT 03/04/2010. FINDINGS: Gallbladder: No gallstones or wall thickening visualized. No sonographic Murphy sign noted by sonographer. Common bile duct: Diameter: 3 mm Liver: No focal lesion identified. Within normal limits in parenchymal echogenicity. Questionable mild contour irregularity of the left lobe. Portal vein is patent on color Doppler imaging with normal direction of blood flow towards the liver. Other: A small right pleural effusion is noted. IMPRESSION: 1. The gallbladder and biliary system appear normal. 2. Questionable mild contour irregularity of the left hepatic lobe without focal lesion. Electronically Signed    By: Carey BullocksWilliam  Veazey M.D.   On: 11/19/2018 18:46     Discharge Exam: Vitals:   11/20/18 0458 11/20/18 0500  BP:  (!) 150/57  Pulse:    Resp: 16   Temp: 98.3 F (36.8 C)   SpO2: 99%    Vitals:   11/19/18 1800 11/19/18 2021 11/20/18 0458 11/20/18 0500  BP: (!) 153/68 (!) 151/67  (!) 150/57  Pulse:  84    Resp:  20 16   Temp:  98 F (36.7 C) 98.3 F (36.8 C)   TempSrc:  Oral Oral   SpO2:  99% 99%   Weight:   61.5 kg   Height:        General: Pt is alert, awake, not in acute distress Cardiovascular: RRR, S1/S2 +, no rubs, no gallops Respiratory: CTA bilaterally, no wheezing, no rhonchi Abdominal: Soft, NT, ND, bowel sounds + Extremities: no edema, no cyanosis    The results of significant diagnostics from this hospitalization (including imaging, microbiology, ancillary and laboratory) are listed below for reference.     Microbiology: Recent Results (from the past 240 hour(s))  SARS Coronavirus 2 [facility](Hospital order, Performed in [facility]) Nasopharyngeal Nasopharyngeal Swab     Status: None   Collection Time: 11/18/18  1:47 AM   Specimen: Nasopharyngeal Swab  Result Value Ref Range Status   SARS Coronavirus 2 NEGATIVE NEGATIVE Final    Comment: (NOTE) If result is NEGATIVE SARS-CoV-2 target nucleic acids are NOT DETECTED. The SARS-CoV-2 RNA is generally detectable in upper and lower  respiratory specimens during the acute phase of infection. The lowest  concentration of SARS-CoV-2 viral copies this assay can detect is 250  copies / mL. A negative result does not preclude SARS-CoV-2 infection  and should not be used as the sole basis for treatment or other  patient management decisions.  A negative result may occur with  improper specimen collection / handling, submission of specimen other  than nasopharyngeal swab, presence of viral mutation(s) within the  areas targeted by this assay, and inadequate number of viral copies  (<250 copies / mL). A  negative result must be combined with clinical  observations, patient history, and epidemiological information. If result is POSITIVE SARS-CoV-2 target nucleic acids are DETECTED. The SARS-CoV-2 RNA is generally detectable in upper and lower  respiratory specimens dur ing the acute phase of infection.  Positive  results are indicative of active infection with SARS-CoV-2.  Clinical  correlation with patient history and other diagnostic information is  necessary to determine patient infection status.  Positive results do  not rule out bacterial infection or co-infection with other viruses. If result is PRESUMPTIVE POSTIVE SARS-CoV-2 nucleic acids MAY BE PRESENT.   A presumptive positive result was obtained on the submitted specimen  and confirmed on repeat testing.  While 2019 novel coronavirus  (SARS-CoV-2) nucleic acids may be present in the submitted sample  additional confirmatory testing may be necessary for epidemiological  and / or clinical management purposes  to differentiate between  SARS-CoV-2 and other Sarbecovirus currently known to infect humans.  If clinically indicated additional testing with an alternate test  methodology [phone]) is advised. The SARS-CoV-2 RNA is generally  detectable in upper and lower respiratory sp ecimens during the acute  phase of infection. The expected result is Negative. Fact Sheet for Patients:  BoilerBrush.com.cy Fact Sheet for Healthcare Providers: https://pope.com/ This test is not yet approved or cleared by the [place] FDA and has been authorized for detection and/or diagnosis of SARS-CoV-2 by FDA under an Emergency Use Authorization (EUA).  This EUA will remain in effect (meaning this test can be used) for the duration of the COVID-19 declaration under Section 564(b)(1) of the Act, 21 U.S.C. section 360bbb-3(b)(1), unless the authorization is terminated or revoked sooner. Performed  at [facility], [address] [place], [place] [address]      Labs: BNP (last 3 results) Recent Labs    11/18/18 0221  BNP 544.4*   Basic Metabolic Panel: Recent Labs  Lab 11/18/18 0221 11/18/18 0735 11/19/18 0536 11/19/18 1523 11/19/18 1527 11/20/18 0402  NA 138  --  138 142 142 139  K 3.8  --  3.5 3.4* 3.4* 3.5  CL 107  --  105  --   --  105  CO2 19*  --  24  --   --  24  GLUCOSE 335*  --  131*  --   --  111*  BUN 22  --  16  --   --  10  CREATININE 1.74* 1.51* 1.17*  --   --  1.05*  CALCIUM 8.0*  --  8.4*  --   --  8.4*   Liver Function Tests: Recent Labs  Lab 11/18/18 0221  AST 141*  ALT 75*  ALKPHOS 129*  BILITOT 0.4  PROT 7.3  ALBUMIN 3.3*   No results for input(s): LIPASE, AMYLASE in the last 168 hours. No results for input(s): AMMONIA in the last 168 hours. CBC: Recent Labs  Lab 11/18/18 0221 11/18/18 0735 11/19/18 0536 11/19/18 1523 11/19/18 1527 11/20/18 0402  WBC 4.6 8.5 5.4  --   --  5.0  NEUTROABS 2.3  --   --   --   --   --   HGB 11.9* 11.3* 11.3* 12.6 12.6 11.4*  HCT 39.2 35.8* 34.8* 37.0 37.0 34.6*  MCV 98.2 95.0 92.3  --   --  91.1  PLT 107* 201 198  --   --  199   Cardiac Enzymes: No results for input(s): CKTOTAL, CKMB, CKMBINDEX, TROPONINI in the last 168 hours. BNP: Invalid input(s): POCBNP CBG: Recent Labs  Lab 11/18/18 1715 11/19/18 0807 11/19/18 1131 11/19/18 2150 11/20/18 0743  GLUCAP 149* 199* 78 139* 193*   D-Dimer No results for input(s): DDIMER in the last 72 hours. Hgb A1c Recent Labs    11/18/18 0735  HGBA1C 6.5*   Lipid Profile No results for input(s): CHOL, HDL, LDLCALC, TRIG, CHOLHDL, LDLDIRECT in the last 72  hours. Thyroid function studies No results for input(s): TSH, T4TOTAL, T3FREE, THYROIDAB in the last 72 hours.  Invalid input(s): FREET3 Anemia work up Recent Labs    11/18/18 0735 11/18/18 0736  VITAMINB12 292  --   FOLATE  --  14.3  FERRITIN 25  --   TIBC 412  --   IRON 35   --   RETICCTPCT 1.3  --    Urinalysis    Component Value Date/Time   COLORURINE YELLOW 11/18/2018 0910   APPEARANCEUR CLEAR 11/18/2018 0910   LABSPEC 1.009 11/18/2018 0910   PHURINE 5.0 11/18/2018 0910   GLUCOSEU NEGATIVE 11/18/2018 0910   HGBUR SMALL (A) 11/18/2018 0910   BILIRUBINUR NEGATIVE 11/18/2018 0910   BILIRUBINUR NEG 01/20/2013 0846   KETONESUR NEGATIVE 11/18/2018 0910   PROTEINUR NEGATIVE 11/18/2018 0910   UROBILINOGEN 0.2 01/20/2013 0846   UROBILINOGEN 0.2 02/13/2010 0943   NITRITE POSITIVE (A) 11/18/2018 0910   LEUKOCYTESUR TRACE (A) 11/18/2018 0910   Sepsis Labs Invalid input(s): PROCALCITONIN,  WBC,  LACTICIDVEN Microbiology Recent Results (from the past 240 hour(s))  SARS Coronavirus 2 [facility](Hospital order, Performed in [facility] hospital lab) Nasopharyngeal Nasopharyngeal Swab     Status: None   Collection Time: 11/18/18  1:47 AM   Specimen: Nasopharyngeal Swab  Result Value Ref Range Status   SARS Coronavirus 2 NEGATIVE NEGATIVE Final    Comment: (NOTE) If result is NEGATIVE SARS-CoV-2 target nucleic acids are NOT DETECTED. The SARS-CoV-2 RNA is generally detectable in upper and lower  respiratory specimens during the acute phase of infection. The lowest  concentration of SARS-CoV-2 viral copies this assay can detect is 250  copies / mL. A negative result does not preclude SARS-CoV-2 infection  and should not be used as the sole basis for treatment or other  patient management decisions.  A negative result may occur with  improper specimen collection / handling, submission of specimen other  than nasopharyngeal swab, presence of viral mutation(s) within the  areas targeted by this assay, and inadequate number of viral copies  (<250 copies / mL). A negative result must be combined with clinical  observations, patient history, and epidemiological information. If result is POSITIVE SARS-CoV-2 target nucleic acids are DETECTED. The SARS-CoV-2 RNA is generally  detectable in upper and lower  respiratory specimens dur ing the acute phase of infection.  Positive  results are indicative of active infection with SARS-CoV-2.  Clinical  correlation with patient history and other diagnostic information is  necessary to determine patient infection status.  Positive results do  not rule out bacterial infection or co-infection with other viruses. If result is PRESUMPTIVE POSTIVE SARS-CoV-2 nucleic acids MAY BE PRESENT.   A presumptive positive result was obtained on the submitted specimen  and confirmed on repeat testing.  While 2019 novel coronavirus  (SARS-CoV-2) nucleic acids may be present in the submitted sample  additional confirmatory testing may be necessary for epidemiological  and / or clinical management purposes  to differentiate between  SARS-CoV-2 and other Sarbecovirus currently known to infect humans.  If clinically indicated additional testing with an alternate test  methodology [phone]([id number]) is advised. The SARS-CoV-2 RNA is generally  detectable in upper and lower respiratory sp ecimens during the acute  phase of infection. The expected result is Negative. Fact Sheet for Patients:  BoilerBrush.com.cyhttps://www.fda.gov/media/136312/download Fact Sheet for Healthcare Providers: https://pope.com/https://www.fda.gov/media/136313/download This test is not yet approved or cleared by the [place] FDA and has been authorized for detection and/or diagnosis of SARS-CoV-2 by FDA  under an Emergency Use Authorization (EUA).  This EUA will remain in effect (meaning this test can be used) for the duration of the COVID-19 declaration under Section 564(b)(1) of the Act, 21 U.S.C. section 360bbb-3(b)(1), unless the authorization is terminated or revoked sooner. Performed at [facility], [place] [address] [place],  [address]      Time coordinating discharge: Over 30 minutes  SIGNED:   Darliss Cheney, MD  [facility] 11/20/2018, 8:39 AM Pager  [phone]  If 7PM-7AM, please contact night-coverage [email or web address] Password [email or web address]

## 2018-11-20 NOTE — Plan of Care (Signed)
  Problem: Clinical Measurements: Goal: Ability to maintain clinical measurements within normal limits will improve Outcome: Progressing Note: BP improving

## 2018-11-20 NOTE — Plan of Care (Signed)
  Problem: Education: Goal: Knowledge of General Education information will improve Description: Including pain rating scale, medication(s)/side effects and non-pharmacologic comfort measures Outcome: Adequate for Discharge   

## 2018-11-20 NOTE — Discharge Instructions (Signed)
Heart Failure Action Plan °A heart failure action plan helps you understand what to do when you have symptoms of heart failure. Follow the plan that was created by you and your health care provider. Review your plan each time you visit your health care provider. °Red zone °These signs and symptoms mean you should get medical help right away: °· You have trouble breathing when resting. °· You have a dry cough that is getting worse. °· You have swelling or pain in your legs or abdomen that is getting worse. °· You suddenly gain more than 2-3 lb (0.9-1.4 kg) in a day, or more than 5 lb (2.3 kg) in one week. This amount may be more or less depending on your condition. °· You have trouble staying awake or you feel confused. °· You have chest pain. °· You do not have an appetite. °· You pass out. °If you experience any of these symptoms: °· Call your local emergency services (911 in the [place] right away or seek help at the emergency department of the nearest hospital. °Yellow zone °These signs and symptoms mean your condition may be getting worse and you should make some changes: °· You have trouble breathing when you are active or you need to sleep with extra pillows. °· You have swelling in your legs or abdomen. °· You gain 2-3 lb (0.9-1.4 kg) in one day, or 5 lb (2.3 kg) in one week. This amount may be more or less depending on your condition. °· You get tired easily. °· You have trouble sleeping. °· You have a dry cough. °If you experience any of these symptoms: °· Contact your health care provider within the next day. °· Your health care provider may adjust your medicines. °Green zone °These signs mean you are doing well and can continue what you are doing: °· You do not have shortness of breath. °· You have very little swelling or no new swelling. °· Your weight is stable (no gain or loss). °· You have a normal activity level. °· You do not have chest pain or any other new symptoms. °Follow these instructions at  home: °· Take over-the-counter and prescription medicines only as told by your health care provider. °· Weigh yourself daily. Your target weight is __________ lb (__________ kg). °? Call your health care provider if you gain more than __________ lb (__________ kg) in a day, or more than __________ lb (__________ kg) in one week. °· Eat a heart-healthy diet. Work with a diet and nutrition specialist (dietitian) to create an eating plan that is best for you. °· Keep all follow-up visits as told by your health care provider. This is important. °Where to find more information °· American Heart Association: www.heart.org °Summary °· Follow the action plan that was created by you and your health care provider. °· Get help right away if you have any symptoms in the Red zone. °This information is not intended to replace advice given to you by your health care provider. Make sure you discuss any questions you have with your health care provider. °Document Released: 05/10/2016 Document Revised: 03/13/2017 Document Reviewed: 05/10/2016 °Elsevier Patient Education © 2020 Elsevier Inc. ° °

## 2018-11-22 ENCOUNTER — Encounter (HOSPITAL_COMMUNITY): Payer: Self-pay | Admitting: Cardiovascular Disease

## 2019-04-28 DIAGNOSIS — E7849 Other hyperlipidemia: Secondary | ICD-10-CM | POA: Diagnosis not present

## 2019-04-28 DIAGNOSIS — I5022 Chronic systolic (congestive) heart failure: Secondary | ICD-10-CM | POA: Diagnosis not present

## 2019-04-28 DIAGNOSIS — I10 Essential (primary) hypertension: Secondary | ICD-10-CM | POA: Diagnosis not present

## 2019-04-28 DIAGNOSIS — E119 Type 2 diabetes mellitus without complications: Secondary | ICD-10-CM | POA: Diagnosis not present

## 2019-08-25 DIAGNOSIS — I10 Essential (primary) hypertension: Secondary | ICD-10-CM | POA: Diagnosis not present

## 2019-08-25 DIAGNOSIS — I5022 Chronic systolic (congestive) heart failure: Secondary | ICD-10-CM | POA: Diagnosis not present

## 2019-08-25 DIAGNOSIS — E7849 Other hyperlipidemia: Secondary | ICD-10-CM | POA: Diagnosis not present

## 2019-08-25 DIAGNOSIS — E119 Type 2 diabetes mellitus without complications: Secondary | ICD-10-CM | POA: Diagnosis not present

## 2019-08-30 DIAGNOSIS — E7849 Other hyperlipidemia: Secondary | ICD-10-CM | POA: Diagnosis not present

## 2019-08-30 DIAGNOSIS — E876 Hypokalemia: Secondary | ICD-10-CM | POA: Diagnosis not present

## 2019-08-30 DIAGNOSIS — D649 Anemia, unspecified: Secondary | ICD-10-CM | POA: Diagnosis not present

## 2019-08-30 DIAGNOSIS — Z79899 Other long term (current) drug therapy: Secondary | ICD-10-CM | POA: Diagnosis not present

## 2019-08-31 DIAGNOSIS — E7849 Other hyperlipidemia: Secondary | ICD-10-CM | POA: Diagnosis not present

## 2019-08-31 DIAGNOSIS — I5022 Chronic systolic (congestive) heart failure: Secondary | ICD-10-CM | POA: Diagnosis not present

## 2019-08-31 DIAGNOSIS — E119 Type 2 diabetes mellitus without complications: Secondary | ICD-10-CM | POA: Diagnosis not present

## 2019-08-31 DIAGNOSIS — R0602 Shortness of breath: Secondary | ICD-10-CM | POA: Diagnosis not present

## 2019-09-14 DIAGNOSIS — R0602 Shortness of breath: Secondary | ICD-10-CM | POA: Diagnosis not present

## 2019-09-14 DIAGNOSIS — E119 Type 2 diabetes mellitus without complications: Secondary | ICD-10-CM | POA: Diagnosis not present

## 2019-09-14 DIAGNOSIS — I5022 Chronic systolic (congestive) heart failure: Secondary | ICD-10-CM | POA: Diagnosis not present

## 2019-09-14 DIAGNOSIS — I10 Essential (primary) hypertension: Secondary | ICD-10-CM | POA: Diagnosis not present

## 2019-10-13 DIAGNOSIS — E7849 Other hyperlipidemia: Secondary | ICD-10-CM | POA: Diagnosis not present

## 2019-10-13 DIAGNOSIS — I5022 Chronic systolic (congestive) heart failure: Secondary | ICD-10-CM | POA: Diagnosis not present

## 2019-10-13 DIAGNOSIS — I2510 Atherosclerotic heart disease of native coronary artery without angina pectoris: Secondary | ICD-10-CM | POA: Diagnosis not present

## 2019-10-13 DIAGNOSIS — I10 Essential (primary) hypertension: Secondary | ICD-10-CM | POA: Diagnosis not present

## 2019-12-15 DIAGNOSIS — E7849 Other hyperlipidemia: Secondary | ICD-10-CM | POA: Diagnosis not present

## 2019-12-15 DIAGNOSIS — I10 Essential (primary) hypertension: Secondary | ICD-10-CM | POA: Diagnosis not present

## 2019-12-15 DIAGNOSIS — E119 Type 2 diabetes mellitus without complications: Secondary | ICD-10-CM | POA: Diagnosis not present

## 2019-12-15 DIAGNOSIS — I5022 Chronic systolic (congestive) heart failure: Secondary | ICD-10-CM | POA: Diagnosis not present

## 2020-01-25 DIAGNOSIS — Z0100 Encounter for examination of eyes and vision without abnormal findings: Secondary | ICD-10-CM | POA: Diagnosis not present

## 2020-02-13 DIAGNOSIS — E7849 Other hyperlipidemia: Secondary | ICD-10-CM | POA: Diagnosis not present

## 2020-02-13 DIAGNOSIS — E876 Hypokalemia: Secondary | ICD-10-CM | POA: Diagnosis not present

## 2020-02-13 DIAGNOSIS — D649 Anemia, unspecified: Secondary | ICD-10-CM | POA: Diagnosis not present

## 2020-02-13 DIAGNOSIS — Z79899 Other long term (current) drug therapy: Secondary | ICD-10-CM | POA: Diagnosis not present

## 2020-02-13 DIAGNOSIS — E1165 Type 2 diabetes mellitus with hyperglycemia: Secondary | ICD-10-CM | POA: Diagnosis not present

## 2020-02-22 DIAGNOSIS — I361 Nonrheumatic tricuspid (valve) insufficiency: Secondary | ICD-10-CM | POA: Diagnosis not present

## 2020-02-22 DIAGNOSIS — I5022 Chronic systolic (congestive) heart failure: Secondary | ICD-10-CM | POA: Diagnosis not present

## 2020-02-22 DIAGNOSIS — I425 Other restrictive cardiomyopathy: Secondary | ICD-10-CM | POA: Diagnosis not present

## 2020-02-22 DIAGNOSIS — I340 Nonrheumatic mitral (valve) insufficiency: Secondary | ICD-10-CM | POA: Diagnosis not present

## 2020-05-24 DIAGNOSIS — I10 Essential (primary) hypertension: Secondary | ICD-10-CM | POA: Diagnosis not present

## 2020-05-24 DIAGNOSIS — I5022 Chronic systolic (congestive) heart failure: Secondary | ICD-10-CM | POA: Diagnosis not present

## 2020-05-24 DIAGNOSIS — E119 Type 2 diabetes mellitus without complications: Secondary | ICD-10-CM | POA: Diagnosis not present

## 2020-05-24 DIAGNOSIS — E7849 Other hyperlipidemia: Secondary | ICD-10-CM | POA: Diagnosis not present

## 2020-06-20 ENCOUNTER — Ambulatory Visit (INDEPENDENT_AMBULATORY_CARE_PROVIDER_SITE_OTHER): Payer: Medicare HMO

## 2020-06-20 ENCOUNTER — Other Ambulatory Visit: Payer: Self-pay

## 2020-06-20 ENCOUNTER — Ambulatory Visit (INDEPENDENT_AMBULATORY_CARE_PROVIDER_SITE_OTHER): Payer: Medicare HMO | Admitting: Family Medicine

## 2020-06-20 ENCOUNTER — Encounter: Payer: Self-pay | Admitting: Family Medicine

## 2020-06-20 VITALS — BP 122/60 | HR 74 | Ht 65.0 in | Wt 146.2 lb

## 2020-06-20 DIAGNOSIS — E1169 Type 2 diabetes mellitus with other specified complication: Secondary | ICD-10-CM

## 2020-06-20 DIAGNOSIS — M1990 Unspecified osteoarthritis, unspecified site: Secondary | ICD-10-CM | POA: Insufficient documentation

## 2020-06-20 DIAGNOSIS — Z0000 Encounter for general adult medical examination without abnormal findings: Secondary | ICD-10-CM | POA: Diagnosis not present

## 2020-06-20 DIAGNOSIS — Z23 Encounter for immunization: Secondary | ICD-10-CM | POA: Diagnosis not present

## 2020-06-20 DIAGNOSIS — M19042 Primary osteoarthritis, left hand: Secondary | ICD-10-CM

## 2020-06-20 DIAGNOSIS — E119 Type 2 diabetes mellitus without complications: Secondary | ICD-10-CM

## 2020-06-20 DIAGNOSIS — M19041 Primary osteoarthritis, right hand: Secondary | ICD-10-CM

## 2020-06-20 DIAGNOSIS — I10 Essential (primary) hypertension: Secondary | ICD-10-CM

## 2020-06-20 DIAGNOSIS — E785 Hyperlipidemia, unspecified: Secondary | ICD-10-CM | POA: Diagnosis not present

## 2020-06-20 LAB — POCT GLYCOSYLATED HEMOGLOBIN (HGB A1C): HbA1c, POC (controlled diabetic range): 5.7 % (ref 0.0–7.0)

## 2020-06-20 LAB — POCT UA - MICROALBUMIN
Creatinine, POC: 50 mg/dL
Microalbumin Ur, POC: 10 mg/L

## 2020-06-20 MED ORDER — DICLOFENAC SODIUM 1 % EX GEL: 2.0000 g | Freq: Four times a day (QID) | CUTANEOUS | 1 refills | Status: DC

## 2020-06-20 NOTE — Patient Instructions (Signed)
It was great seeing you today!  I am glad you are doing well! I have prescribed voltaren gel, please apply these to the area that you are experiencing muscle cramps. Also try a heating pad along with the voltaren gel for your back.   I will contact you with any abnormal results from your blood work today.   Congratulations on getting the first dose of your COVID vaccine, please return in 3 weeks for your second dose. It is normal to not feel like yourself for a bit after the vaccine, this should resolve within 24-48 hours.   Please follow up in 3 weeks, if anything arises between now and then, please don't hesitate to contact our office.   Thank you for allowing [place] to be a part of your medical care!  Thank you, Dr. Robyne Peers

## 2020-06-20 NOTE — Progress Notes (Signed)
    SUBJECTIVE:   CHIEF COMPLAINT / HPI:   Patient presents to the clinic to establish care. She has not been seen by a PCP in years. 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 Current medications are updated in the chart. Denies any known drug allergies. Denies previous surgical history and recent hospitalizations. Family history includes hypertension in her mother. Diet includes plenty of fruits and vegetables daily. She typically exercises regularly but has a history of a pinched nerve which sometimes interferes with her exercise routine when she experiences back pain. Former smoker who quit on 11/18/2018, denies cravings or withdrawals. Drinks 1-2 beers a week but no other alcohol use. Currently, she is not sexually active.   Myalgias Endorsing muscle cramps in her hands bilaterally that have been going on for months. She has not tried anything to relieve the pain. Nothing makes it better or worse particularly. Denies any recent trauma or injury. Compliant on atorvastatin daily.  PERTINENT  PMH / PSH:  Hypertension Denies chest pain, dyspnea and palpitations. Current anti-hypertensive regimen includes amlodipine 10 mg daily, carvedilol 12.5 mg bid daily and hydralazine 25 mg bid. Compliant on home regimen and tolerating medication without complications. Home record of her blood pressures is typically around 120/60, with lowest systolic approximately 117.   Type 2 DM Compliant on metformin 1000 mg bid daily, denies complications. Checks blood glucose levels regularly at home which range around 100-120. Denies hypoglycemic episodes or dizziness. Last saw ophthalmologist in October 2021.   OBJECTIVE:   BP 122/60   Pulse 74   Ht 5\' 5"  (1.651 m)   Wt 146 lb 4 oz (66.3 kg)   SpO2 97%   BMI 24.34 kg/m   General: Patient well-appearing, in no acute distress. Neck: supple, no evidence of  lymphadenopathy, non-tender thyroid CV: RRR, no murmurs or gallops auscultated Resp: CTAB, no rales or rhonchi noted Abdomen: soft, nontender, presence of bowel sounds, no evidence of organomegaly Ext: no LE edema noted bilaterally, radial pulses strong and equally bilaterally MSK: normal active and passive ROM of wrists bilaterally, negative Tinel's sign and Phalen's test, no gross deformity of fingers bilaterally, no edema or erythema noted bilaterally Neuro: alert, normal gait without assistance Psych: mood appropriate   ASSESSMENT/PLAN:   Essential hypertension -BP 122/60 in the office, at goal -continue current regimen -diet and exercise counseling  Diabetes mellitus type 2 in nonobese (HCC) -A1c 5.7, pending BMP -continue metformin -consider foot exam at next appointment -diet and exercise counseling provided  -reassurance provided  Osteoarthritis -with possible component secondary to atorvastatin, instructed to take tsp mustard for possible alleviation of symptoms -voltaren gel prescribed -f/u in 3 weeks  Health maintenance -Mammogram ordered -GI referral placed for colonoscopy  -COVID (1st dose) and influenza vaccines administered today. -pending blood work -Med rec reviewed and updated appropriately.   , DO [facility]

## 2020-06-20 NOTE — Assessment & Plan Note (Signed)
-  BP 122/60 in the office, at goal -continue current regimen -diet and exercise counseling

## 2020-06-20 NOTE — Assessment & Plan Note (Signed)
-  A1c 5.7, pending BMP -continue metformin -consider foot exam at next appointment -diet and exercise counseling provided  -reassurance provided

## 2020-06-20 NOTE — Assessment & Plan Note (Signed)
-  with possible component secondary to atorvastatin, instructed to take tsp mustard for possible alleviation of symptoms -voltaren gel prescribed -f/u in 3 weeks

## 2020-06-21 LAB — BASIC METABOLIC PANEL
BUN/Creatinine Ratio: 15 (ref 12–28)
BUN: 15 mg/dL (ref 8–27)
CO2: 21 mmol/L (ref 20–29)
Calcium: 9.4 mg/dL (ref 8.7–10.3)
Chloride: 103 mmol/L (ref 96–106)
Creatinine, Ser: 0.97 mg/dL (ref 0.57–1.00)
Glucose: 130 mg/dL — ABNORMAL HIGH (ref 65–99)
Potassium: 4.2 mmol/L (ref 3.5–5.2)
Sodium: 139 mmol/L (ref 134–144)
eGFR: 64 mL/min/{1.73_m2} (ref >59)

## 2020-06-21 LAB — LIPID PANEL
Chol/HDL Ratio: 2.6 ratio (ref 0.0–4.4)
Cholesterol, Total: 145 mg/dL (ref 100–199)
HDL: 55 mg/dL (ref >39)
LDL Chol Calc (NIH): 72 mg/dL (ref 0–99)
Triglycerides: 94 mg/dL (ref 0–149)
VLDL Cholesterol Cal: 18 mg/dL (ref 5–40)

## 2020-06-21 LAB — CK: Total CK: 135 U/L (ref 32–182)

## 2020-06-21 LAB — TSH: TSH: 1.17 u[IU]/mL (ref 0.450–4.500)

## 2020-06-22 ENCOUNTER — Encounter: Payer: Self-pay | Admitting: Family Medicine

## 2020-07-12 ENCOUNTER — Other Ambulatory Visit: Payer: Self-pay

## 2020-07-12 ENCOUNTER — Encounter: Payer: Self-pay | Admitting: Family Medicine

## 2020-07-12 ENCOUNTER — Ambulatory Visit (INDEPENDENT_AMBULATORY_CARE_PROVIDER_SITE_OTHER): Payer: Medicare HMO | Admitting: Family Medicine

## 2020-07-12 ENCOUNTER — Ambulatory Visit (INDEPENDENT_AMBULATORY_CARE_PROVIDER_SITE_OTHER): Payer: Medicare HMO

## 2020-07-12 VITALS — BP 125/80 | HR 80 | Ht 65.0 in | Wt 146.4 lb

## 2020-07-12 DIAGNOSIS — E119 Type 2 diabetes mellitus without complications: Secondary | ICD-10-CM

## 2020-07-12 DIAGNOSIS — Z23 Encounter for immunization: Secondary | ICD-10-CM

## 2020-07-12 DIAGNOSIS — M791 Myalgia, unspecified site: Secondary | ICD-10-CM | POA: Insufficient documentation

## 2020-07-12 DIAGNOSIS — F5101 Primary insomnia: Secondary | ICD-10-CM | POA: Diagnosis not present

## 2020-07-12 MED ORDER — MELATONIN 1 MG PO CAPS: 3.0000 mg | ORAL_CAPSULE | Freq: Every day | ORAL | 3 refills | Status: DC

## 2020-07-12 NOTE — Progress Notes (Signed)
    SUBJECTIVE:   CHIEF COMPLAINT / HPI:   Insomnia Patient endorses having trouble with both falling and staying asleep. She typically gets about 3 hours of sleep each night. Reports that this has been occurring since July 14, 2011 when her husband died, she states that she has been doing well as she has a great support system of her son, daughter and 3 grandchildren. Denies any recent episodes of feeling down or depressed. Endorses having a good appetite, denies any personal stressors and does not take naps throughout the day.   Generalized pain Endorsing areas of myalgias and cramping that cause discomfort and pain. States that the more prominent areas include shoulders and hands bilaterally. Reports that these have been occurring over the past year. Denies numbness and tingling. Denies any associated trauma or injury. States that this does not negatively impact her quality of life and daily activities of living. Denies any rashes or weakness. Has tried tylenol and voltaren gel which both do not help.   PERTINENT  PMH / PSH:   Type 2 DM Compliant on metformin as prescribed. Last A1c 5.7. Checks blood glucose levels at home which typically range around 100-116. Most recent visit to the ophthalmologist was in October 2021 which she reports was normal. Denies any hypoglycemic episodes.   OBJECTIVE:   BP 125/80   Pulse 80   Ht $R'5\' 5"'EW$  (1.651 m)   Wt 146 lb 6.4 oz (66.4 kg)   SpO2 97%   BMI 24.36 kg/m   General: Patient well-appearing, in no acute distress. CV: RRR, no murmurs or gallops auscultated Resp: CTAB, no rales or rhonchi noted Ext: no UE and LE edema noted bilaterally, mild ulnar deviation noted bilaterally, radial pulses strong and equal bilaterally Neuro: normal gait, intact interosseous strength, gross sensation intact, 5/5 grip strength, 5/5 UE and LE strength bilaterally Psych: mood appropriate, very pleasant  ASSESSMENT/PLAN:   Insomnia -melatonin prescribed -sleep hygiene  discussed   Generalized muscle ache -differentials include polymyalgia rheumatica and rheumatoid arthritis, pending labs including ESR, CRP, CBC, Rh. Will proceed with ACP if positive Rh. If negative blood work, highly consider fibromyalgia -treatment dependent on pending blood work, patient instructed that she will be notified of results and next steps -follow up in 1 month    Diabetes mellitus type 2 in nonobese ([place]) -well-controlled with A1c at goal -continue metformin as previously prescribed -diet and exercise counseling  -instructed to maintain annual ophthalmologist visits    Health maintenance -2nd dose of COVID vaccine given today -Patient defers tetanus and PNA vaccines to next visit.   [name], [facility]

## 2020-07-12 NOTE — Patient Instructions (Signed)
It was great seeing you today!  Today we discussed your muscle pains, we did some blood work to better determine what this may be. I have prescribed melatonin for sleep, please take this every night before bedtime. Please follow some of the tips for better sleep that we also discussed including keeping a journal and having a regular bedtime routine.   Please follow up in 1 month for your next scheduled appointment, if anything arises between now and then, please don't hesitate to contact our office.   Thank you for allowing [place] to be a part of your medical care!  Thank you, Dr. Robyne Peers   Quality Sleep Information, Adult Quality sleep is important for your mental and physical health. It also improves your quality of life. Quality sleep means you:  Are asleep for most of the time you are in bed.  Fall asleep within 30 minutes.  Wake up no more than once a night.  Are awake for no longer than 20 minutes if you do wake up during the night. Most adults need 7-8 hours of quality sleep each night. How can poor sleep affect me? If you do not get enough quality sleep, you may have:  Mood swings.  Daytime sleepiness.  Confusion.  Decreased reaction time.  Sleep disorders, such as insomnia and sleep apnea.  Difficulty with: ? Solving problems. ? Coping with stress. ? Paying attention. These issues may affect your performance and productivity at work, school, and at home. Lack of sleep may also put you at higher risk for accidents, suicide, and risky behaviors. If you do not get quality sleep you may also be at higher risk for several health problems, including:  Infections.  Type 2 diabetes.  Heart disease.  High blood pressure.  Obesity.  Worsening of long-term conditions, like arthritis, kidney disease, depression, Parkinson's disease, and epilepsy. What actions can I take to get more quality sleep?  Stick to a sleep schedule. Go to sleep and wake up at about the same  time each day. Do not try to sleep less on weekdays and make up for lost sleep on weekends. This does not work.  Try to get about 30 minutes of exercise on most days. Do not exercise 2-3 hours before going to bed.  Limit naps during the day to 30 minutes or less.  Do not use any products that contain nicotine or tobacco, such as cigarettes or e-cigarettes. If you need help quitting, ask your health care provider.  Do not drink caffeinated beverages for at least 8 hours before going to bed. Coffee, tea, and some sodas contain caffeine.  Do not drink alcohol close to bedtime.  Do not eat large meals close to bedtime.  Do not take naps in the late afternoon.  Try to get at least 30 minutes of sunlight every day. Morning sunlight is best.  Make time to relax before bed. Reading, listening to music, or taking a hot bath promotes quality sleep.  Make your bedroom a place that promotes quality sleep. Keep your bedroom dark, quiet, and at a comfortable room temperature. Make sure your bed is comfortable. Take out sleep distractions like TV, a computer, smartphone, and bright lights.  If you are lying awake in bed for longer than 20 minutes, get up and do a relaxing activity until you feel sleepy.  Work with your health care provider to treat medical conditions that may affect sleeping, such as: ? Nasal obstruction. ? Snoring. ? Sleep apnea and other sleep  disorders.  Talk to your health care provider if you think any of your prescription medicines may cause you to have difficulty falling or staying asleep.  If you have sleep problems, talk with a sleep consultant. If you think you have a sleep disorder, talk with your health care provider about getting evaluated by a specialist.      Where to find more information  National Sleep Foundation website: https://sleepfoundation.org  National Heart, Lung, and Blood Institute (NHLBI):  https://hall.info/.pdf  Centers for Disease Control and Prevention (CDC): DetailSports.is Contact a health care provider if you:  Have trouble getting to sleep or staying asleep.  Often wake up very early in the morning and cannot get back to sleep.  Have daytime sleepiness.  Have daytime sleep attacks of suddenly falling asleep and sudden muscle weakness (narcolepsy).  Have a tingling sensation in your legs with a strong urge to move your legs (restless legs syndrome).  Stop breathing briefly during sleep (sleep apnea).  Think you have a sleep disorder or are taking a medicine that is affecting your quality of sleep. Summary  Most adults need 7-8 hours of quality sleep each night.  Getting enough quality sleep is an important part of health and well-being.  Make your bedroom a place that promotes quality sleep and avoid things that may cause you to have poor sleep, such as alcohol, caffeine, smoking, and large meals.  Talk to your health care provider if you have trouble falling asleep or staying asleep. This information is not intended to replace advice given to you by your health care provider. Make sure you discuss any questions you have with your health care provider. Document Revised: 07/08/2017 Document Reviewed: 07/08/2017 Elsevier Patient Education  2021 [employer].

## 2020-07-12 NOTE — Assessment & Plan Note (Signed)
-  well-controlled with A1c at goal -continue metformin as previously prescribed -diet and exercise counseling  -instructed to maintain annual ophthalmologist visits

## 2020-07-12 NOTE — Assessment & Plan Note (Addendum)
-  differentials include polymyalgia rheumatica and rheumatoid arthritis, pending labs including ESR, CRP, CBC, Rh. Will proceed with ACP if positive Rh. If negative blood work, highly consider fibromyalgia -treatment dependent on pending blood work, patient instructed that she will be notified of results and next steps -follow up in 1 month

## 2020-07-12 NOTE — Assessment & Plan Note (Signed)
-  melatonin prescribed -sleep hygiene discussed

## 2020-07-13 LAB — CBC WITH DIFFERENTIAL/PLATELET
Basophils Absolute: 0.1 10*3/uL (ref 0.0–0.2)
Basos: 1 %
EOS (ABSOLUTE): 0.2 10*3/uL (ref 0.0–0.4)
Eos: 3 %
Hematocrit: 35 % (ref 34.0–46.6)
Hemoglobin: 12.3 g/dL (ref 11.1–15.9)
Immature Grans (Abs): 0 10*3/uL (ref 0.0–0.1)
Immature Granulocytes: 0 %
Lymphocytes Absolute: 1.1 10*3/uL (ref 0.7–3.1)
Lymphs: 20 %
MCH: 31.4 pg (ref 26.6–33.0)
MCHC: 35.1 g/dL (ref 31.5–35.7)
MCV: 89 fL (ref 79–97)
Monocytes Absolute: 0.5 10*3/uL (ref 0.1–0.9)
Monocytes: 9 %
Neutrophils Absolute: 3.8 10*3/uL (ref 1.4–7.0)
Neutrophils: 67 %
Platelets: 214 10*3/uL (ref 150–450)
RBC: 3.92 x10E6/uL (ref 3.77–5.28)
RDW: 12 % (ref 11.7–15.4)
WBC: 5.6 10*3/uL (ref 3.4–10.8)

## 2020-07-13 LAB — C-REACTIVE PROTEIN: CRP: <1 mg/L (ref 0–10)

## 2020-07-13 LAB — SEDIMENTATION RATE: Sed Rate: 33 mm/hr (ref 0–40)

## 2020-07-13 LAB — RHEUMATOID FACTOR: Rhuematoid fact SerPl-aCnc: <10 IU/mL (ref <14.0)

## 2020-07-13 IMAGING — DX PORTABLE CHEST - 1 VIEW
1 series · 1 of 1 positions shown · non-contrast
Comparison: None.

CLINICAL DATA: Shortness of breath

EXAM:
PORTABLE CHEST 1 VIEW

[chest]
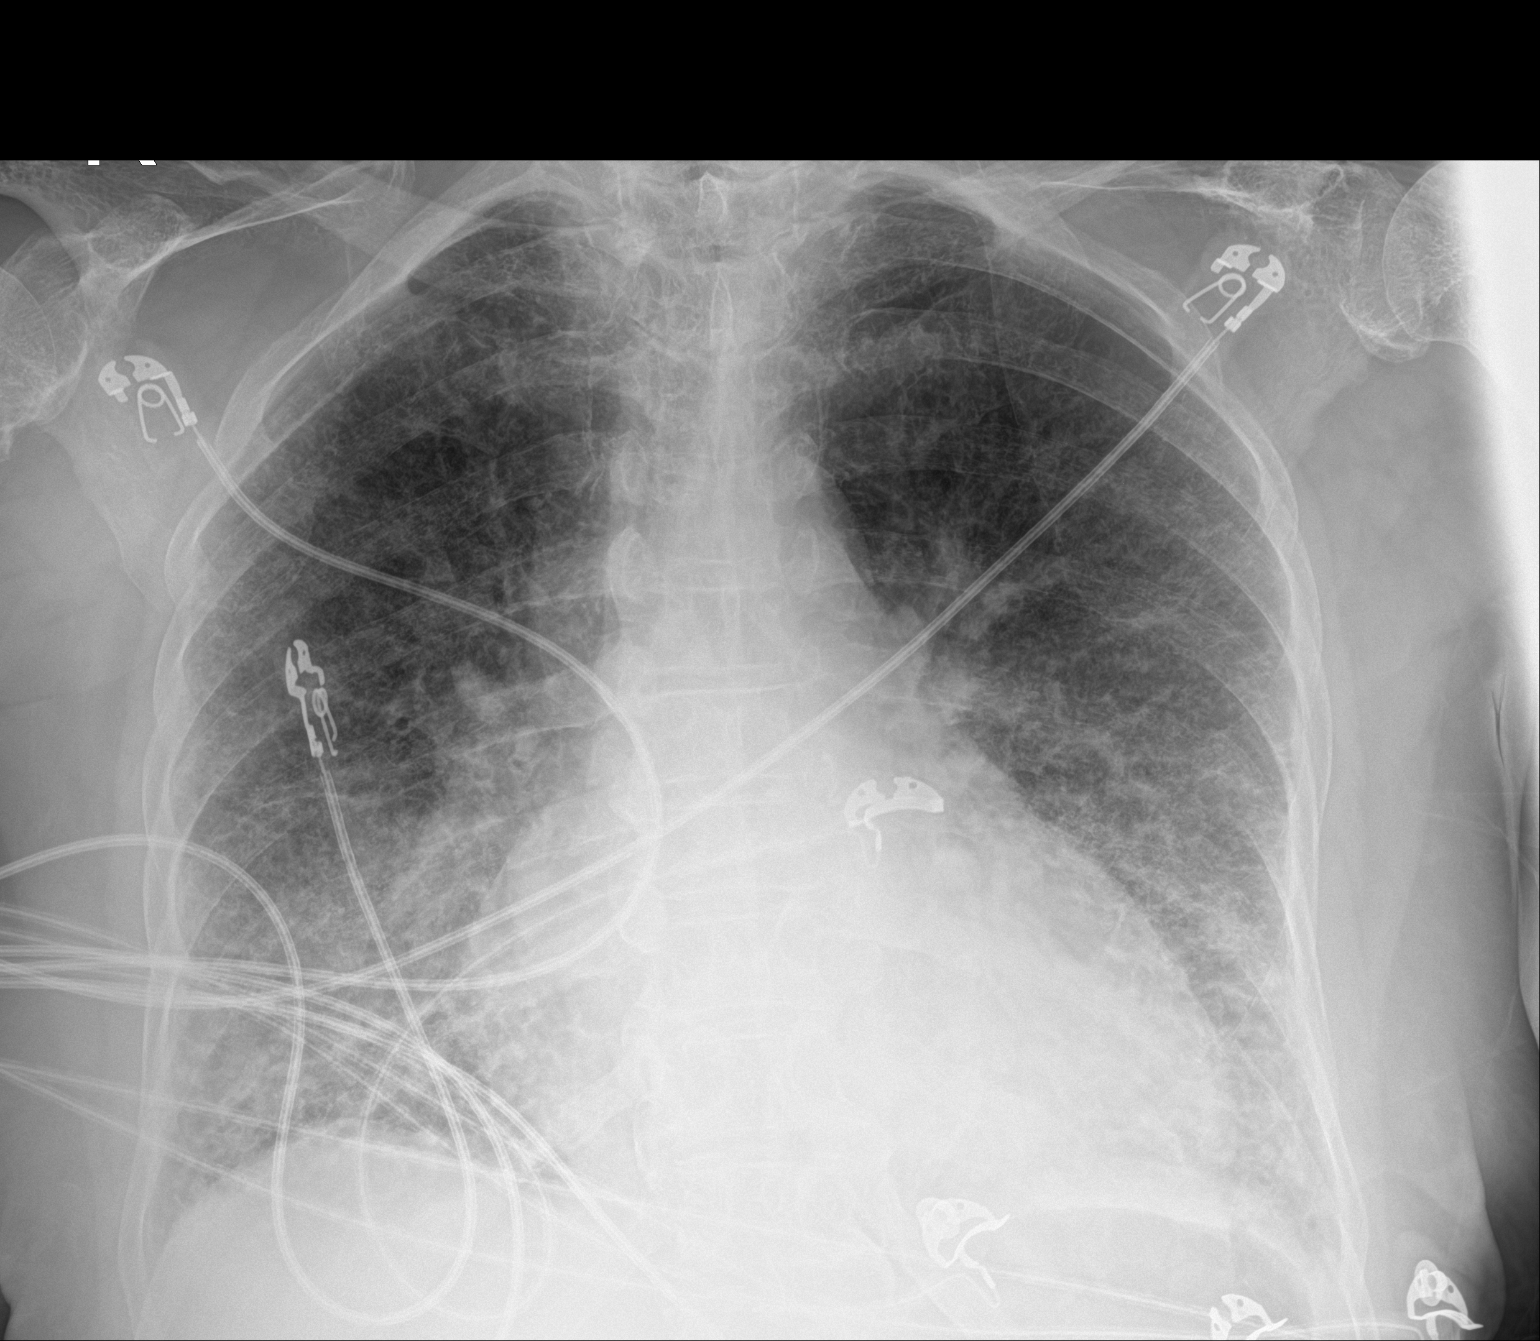

[1 of 1 positions shown; findings below may reference images not displayed]

FINDINGS: Cardiomegaly, vascular congestion. Diffuse interstitial prominence.
Bilateral lower lobe airspace opacities. Findings most compatible
with edema/CHF. No visible effusions or acute bony abnormality.
IMPRESSION: Moderate edema/CHF.

## 2020-08-29 ENCOUNTER — Other Ambulatory Visit: Payer: Self-pay

## 2020-08-29 ENCOUNTER — Encounter: Payer: Self-pay | Admitting: Family Medicine

## 2020-08-29 ENCOUNTER — Ambulatory Visit (INDEPENDENT_AMBULATORY_CARE_PROVIDER_SITE_OTHER): Payer: Medicare HMO | Admitting: Family Medicine

## 2020-08-29 VITALS — BP 122/62 | HR 81 | Wt 147.0 lb

## 2020-08-29 DIAGNOSIS — F32A Depression, unspecified: Secondary | ICD-10-CM

## 2020-08-29 DIAGNOSIS — I10 Essential (primary) hypertension: Secondary | ICD-10-CM

## 2020-08-29 DIAGNOSIS — F5101 Primary insomnia: Secondary | ICD-10-CM

## 2020-08-29 DIAGNOSIS — M797 Fibromyalgia: Secondary | ICD-10-CM | POA: Diagnosis not present

## 2020-08-29 MED ORDER — MELATONIN 1 MG PO CAPS: 3.0000 mg | ORAL_CAPSULE | Freq: Every day | ORAL | 3 refills | Status: DC

## 2020-08-29 MED ORDER — CITALOPRAM HYDROBROMIDE 20 MG PO TABS: 20.0000 mg | ORAL_TABLET | Freq: Every day | ORAL | 3 refills | Status: DC

## 2020-08-29 NOTE — Assessment & Plan Note (Signed)
-  prescribed melatonin again, instructed to call office if she does not receive meds within 1 week -sleep hygiene -likely to improve with med as secondary to depression

## 2020-08-29 NOTE — Assessment & Plan Note (Signed)
-  started celexa 20 mg daily -list of therapists provided, instructed to reach out and make appointment with one that seems most appropriate -f/u in 2 weeks for mood check and med check given starting on new medication

## 2020-08-29 NOTE — Assessment & Plan Note (Signed)
-  CBC, CRP, ESR and Rheumatoid factor obtained last visit which were all within normal limits. Given mood concerns and based on diagnosis of exclusion, like fibromyalgia -reassurance given along with first-line treatment of stretches

## 2020-08-29 NOTE — Patient Instructions (Addendum)
It was great seeing you today!  Your blood pressure looks great, please continue to take your medications as prescribed.  I have prescribed you celexa, please take this daily. I have also prescribed melatonin again, please take this each night before going to bed. Remember to try to not take naps during the day and avoid caffeine in the evenings. I believe that your pain is due to a condition called fibromyalgia. The medication should help but also first line treatment is stretches and exercises, please do these daily, if you can it would be more helpful to do multiple times a day.  Please follow up in 2 weeks for your next scheduled appointment, if anything arises between now and then, please don't hesitate to contact our office.   Thank you for allowing [place] to be a part of your medical care!  Thank you, Dr. Robyne Peers    Myofascial Pain Syndrome and Fibromyalgia Myofascial pain syndrome and fibromyalgia are both pain disorders. This pain may be felt mainly in your muscles.  Myofascial pain syndrome: ? Always has tender points in the muscle that will cause pain when pressed (trigger points). The pain may come and go. ? Usually affects your neck, upper back, and shoulder areas. The pain often radiates into your arms and hands.  Fibromyalgia: ? Has muscle pains and tenderness that come and go. ? Is often associated with fatigue and sleep problems. ? Has trigger points. ? Tends to be long-lasting (chronic), but is not life-threatening. Fibromyalgia and myofascial pain syndrome are not the same. However, they often occur together. If you have both conditions, each can make the other worse. Both are common and can cause enough pain and fatigue to make day-to-day activities difficult. Both can be hard to diagnose because their symptoms are common in many other conditions. What are the causes? The exact causes of these conditions are not known. What increases the risk? You are more likely to  develop this condition if:  You have a family history of the condition.  You have certain triggers, such as: ? Spine disorders. ? An injury (trauma) or other physical stressors. ? Being under a lot of stress. ? Medical conditions such as osteoarthritis, rheumatoid arthritis, or lupus. What are the signs or symptoms? Fibromyalgia The main symptom of fibromyalgia is widespread pain and tenderness in your muscles. Pain is sometimes described as stabbing, shooting, or burning. You may also have:  Tingling or numbness.  Sleep problems and fatigue.  Problems with attention and concentration (fibro fog). Other symptoms may include:  Bowel and bladder problems.  Headaches.  Visual problems.  Problems with odors and noises.  Depression or mood changes.  Painful menstrual periods (dysmenorrhea).  Dry skin or eyes. These symptoms can vary over time. Myofascial pain syndrome Symptoms of myofascial pain syndrome include:  Tight, ropy bands of muscle.  Uncomfortable sensations in muscle areas. These may include aching, cramping, burning, numbness, tingling, and weakness.  Difficulty moving certain parts of the body freely (poor range of motion). How is this diagnosed? This condition may be diagnosed by your symptoms and medical history. You will also have a physical exam. In general:  Fibromyalgia is diagnosed if you have pain, fatigue, and other symptoms for more than 3 months, and symptoms cannot be explained by another condition.  Myofascial pain syndrome is diagnosed if you have trigger points in your muscles, and those trigger points are tender and cause pain elsewhere in your body (referred pain). How is this treated? Treatment  for these conditions depends on the type that you have.  For fibromyalgia: ? Pain medicines, such as NSAIDs. ? Medicines for treating depression. ? Medicines for treating seizures. ? Medicines that relax the muscles.  For myofascial  pain: ? Pain medicines, such as NSAIDs. ? Cooling and stretching of muscles. ? Trigger point injections. ? Sound wave (ultrasound) treatments to stimulate muscles. Treating these conditions often requires a team of health care providers. These may include:  Your primary care provider.  Physical therapist.  Complementary health care providers, such as massage therapists or acupuncturists.  Psychiatrist for cognitive behavioral therapy.   Follow these instructions at home: Medicines  Take over-the-counter and prescription medicines only as told by your health care provider.  Do not drive or use heavy machinery while taking prescription pain medicine.  If you are taking prescription pain medicine, take actions to prevent or treat constipation. Your health care provider may recommend that you: ? Drink enough fluid to keep your urine pale yellow. ? Eat foods that are high in fiber, such as fresh fruits and vegetables, whole grains, and beans. ? Limit foods that are high in fat and processed sugars, such as fried or sweet foods. ? Take an over-the-counter or prescription medicine for constipation. Lifestyle  Exercise as directed by your health care provider or physical therapist.  Practice relaxation techniques to control your stress. You may want to try: ? Biofeedback. ? Visual imagery. ? Hypnosis. ? Muscle relaxation. ? Yoga. ? Meditation.  Maintain a healthy lifestyle. This includes eating a healthy diet and getting enough sleep.  Do not use any products that contain nicotine or tobacco, such as cigarettes and e-cigarettes. If you need help quitting, ask your health care provider.   General instructions  Talk to your health care provider about complementary treatments, such as acupuncture or massage.  Consider joining a support group with others who are diagnosed with this condition.  Do not do activities that stress or strain your muscles. This includes repetitive motions  and heavy lifting.  Keep all follow-up visits as told by your health care provider. This is important. Where to find more information  National Fibromyalgia Association: www.fmaware.org  Arthritis Foundation: www.arthritis.org  American Chronic Pain Association: www.theacpa.org Contact a health care provider if:  You have new symptoms.  Your symptoms get worse or your pain is severe.  You have side effects from your medicines.  You have trouble sleeping.  Your condition is causing depression or anxiety. Summary  Myofascial pain syndrome and fibromyalgia are pain disorders.  Myofascial pain syndrome has tender points in the muscle that will cause pain when pressed (trigger points). Fibromyalgia also has muscle pains and tenderness that come and go, but this condition is often associated with fatigue and sleep disturbances.  Fibromyalgia and myofascial pain syndrome are not the same but often occur together, causing pain and fatigue that make day-to-day activities difficult.  Treatment for fibromyalgia includes taking medicines to relax the muscles and medicines for pain, depression, or seizures. Treatment for myofascial pain syndrome includes taking medicines for pain, cooling and stretching of muscles, and injecting medicines into trigger points.  Follow your health care provider's instructions for taking medicines and maintaining a healthy lifestyle. This information is not intended to replace advice given to you by your health care provider. Make sure you discuss any questions you have with your health care provider. Document Revised: 07/23/2018 Document Reviewed: 04/15/2017 Elsevier Patient Education  2021 [employer].    Therapy and Counseling  Resources Most providers on this list will take Medicaid. Patients with commercial insurance or Medicare should contact their insurance company to get a list of in network providers.  [facility]:Psychiatry and Counseling [address] [facility] [place] [place], [place] [address] [phone]  [facility]  [address], [address] [place], [place] [address]      [phone]  [facility] [address]  [place], [place] [address] [phone]  [facility] [address], [address]  [place], [place] [address]       [phone]     [facility] (virtual only) [phone]  Jovita Kussmaul [facility] [address] [place], [place] [phone]  [facility]:  [address] [place] [phone]  [facility]:  [address] [place] [phone]  [facility] (under & uninsured) [address]   [place] [phone]    [email or web address]    [facility] [address] . [place]    [phone]  [facility] -[address]     Phone:  [phone]     [facility]-  [address]  [phone]   [facility] [address]      [place], [place] [phone]  [facility]: [address] [place], [place], [place]  [phone]   [facility] (Spanish therapist) [email or web address]  [address] [place]  [address]   [place] [address]    [phone]    [facility]   [address],  [place], [place]  [phone]   [facility]  [address] [place]  [phone]  [facility]  [address] [place], [place]        [phone]  Open Access/Walk In Clinic under & uninsured  [facility]  [address] [place], [place] [phone] Crisis [phone]  [facility] [place],  (Spanish)   [address], [place]: [phone]) 8:30 - 12; 1 - 2:30  [facility] [employer],  [address] [place], [place]    [phone]):8:30 - 12; 2 -  3PM  [facility] [employer],  [address]  [place]; [phone]):   Mon - Fri 8 AM - 5 PM  [facility] [address] [place]  MWF 12:30 to 3:00 or call to schedule an appointment  [phone]  Specific Provider options Psychology Today  [email or web address].psychologytoday.[email or web address]/us 1. click on find a therapist  2. enter your zip code 3. left side and select or tailor a therapist for your specific need.   [facility] Provider Directory [email or web address]  (Medicaid)   Follow all drop down to find a provider  Social Support program [facility] [place] [phone] or [employer]pl [address], [place], [place] Recovery support and educational   24- Hour Availability:  .  Marland [facility]  . [address] [place], [place] [employer] [phone] Crisis [phone]  . [facility] [phone]  [facility] Service  [phone]   . [facility] [employer]  [phone] (after hours)  . [facility]/Mobile Crisis   [phone]  . [place] National Suicide Hotline  [phone] (TALK)  . Call 911 or go to emergency room  . [employer]  [phone]);  [place] and [employer]   . [facility]  [phone]); [place], [place], [place], [place], [place], [place], [place]

## 2020-08-29 NOTE — Progress Notes (Signed)
SUBJECTIVE:   CHIEF COMPLAINT / HPI:   Myalgias Endorsing myalgias, similar to concern at last visit. Experiencing generalized pain that typically occurs constantly throughout the day. Does not hinder her from doing activities of daily living. States that her mood is "alright." She does not have a support system in place. Currently lives with daughter who patient conveys is not generally a happy person. Patient is desiring to move out and currently in the process of looking for another place to live. She also utilizes public transportation. Denies suicidal ideation. Endorses trouble sleeping, mainly issues with staying asleep. Does not take naps and typically only gets 4 hours of sleep a night. States that she did not receive the melatonin that was prescribed at the last visit. Was previously on celexa but was discontinued for unknown reasons. Stated that it worked well previously and is open to be on medication if needed. Endorses never seeing a therapist before.   PERTINENT  PMH / PSH:   Hypertension Compliant on tripe anti-hypertensive therapy including coreg, amlodipine and hydralazine. Denies chest pain and dyspnea.   OBJECTIVE:   BP 122/62   Pulse 81   Wt 147 lb (66.7 kg)   SpO2 99%   BMI 24.46 kg/m   General: Patient well-appearing, in no acute distress. CV: RRR, no murmurs or gallops auscultated Resp: CTAB, no wheezing, rales or rhonchi noted Abdomen: soft, nontender, BS+ Ext: radial pulses strong and equal bilaterally, no LE edema noted bilaterally  MSK: tenderness on palpation of multiple tender points along shoulders, knees, arms and back bilaterally  Neuro: normal gait, appropriately conversational Psych: mood appropriate, denies SI  ASSESSMENT/PLAN:   Fibromyalgia -CBC, CRP, ESR and Rheumatoid factor obtained last visit which were all within normal limits. Given mood concerns and based on diagnosis of exclusion, like fibromyalgia -reassurance given along with  first-line treatment of stretches   Depression -started celexa 20 mg daily -list of therapists provided, instructed to reach out and make appointment with one that seems most appropriate -f/u in 2 weeks for mood check and med check given starting on new medication   Insomnia -prescribed melatonin again, instructed to call office if she does not receive meds within 1 week -sleep hygiene -likely to improve with med as secondary to depression   Essential hypertension -BP 122/62 today, at goal -continue anti-hypertensives as prescribed -diet and exercise counseling      [name], [place]

## 2020-08-29 NOTE — Assessment & Plan Note (Signed)
-  BP 122/62 today, at goal -continue anti-hypertensives as prescribed -diet and exercise counseling

## 2020-09-17 ENCOUNTER — Ambulatory Visit (INDEPENDENT_AMBULATORY_CARE_PROVIDER_SITE_OTHER): Payer: Medicare HMO | Admitting: Family Medicine

## 2020-09-17 ENCOUNTER — Other Ambulatory Visit: Payer: Self-pay

## 2020-09-17 ENCOUNTER — Encounter: Payer: Self-pay | Admitting: Family Medicine

## 2020-09-17 DIAGNOSIS — F32A Depression, unspecified: Secondary | ICD-10-CM

## 2020-09-17 NOTE — Progress Notes (Signed)
    SUBJECTIVE:   CHIEF COMPLAINT / HPI:   Depression Presents for mood check and medication follow up. Recently started on citalopram 20 mg daily about 2 weeks ago. States that she taken the medication for a week before she began to feel nauseous without other symptoms. Believes it is due to the medication as she has not had any other changes to her diet or lifestyle. For the last week has not been on any medication. Typically takes medication with drink or on an empty stomach. Endorses having a good appetite, eats all 3 meals in a day. Has not had any trouble sleeping since last visit when she was recommended to participate in yoga and a bedtime routine nightly. She has had more good days than bad, endorses that she typically has been in a good mood since. Things with her daughter have been good recently and she is in the process of moving out from her daughter's place. States that the search is going well and that she is enjoying the process. Was provided with a list of therapists at last visit but states that she would rather come talk to PCP which she feels has been helping thus far. She does not want to have another place to go as she typically is used to going places with her husband who passed away a few years ago.    OBJECTIVE:   BP 124/68   Pulse 76   Wt 146 lb (66.2 kg)   SpO2 99%   BMI 24.30 kg/m   General: Patient well-appearing, in no acute distress. CV: RRR, no murmurs auscultated Resp: CTAB Abdomen: soft, nontender, presence of bowel sounds Ext: radial pulses strong and equal bilaterally Neuro: normal gait Psych: mood appropriate, denies active and passive SI  ASSESSMENT/PLAN:   Depression -PHQ-9 score of 3 with negative question 9 -through shared decision making, decided that patient can restart citalopram at her request but encouraged to take with food and never on an empty stomach, also explained that this medication may take about 4-6 weeks to truly take into effect,  patient voices understanding and agreeable to plan -patient declines medication change at this time -encouraged therapy if needed at a later time -f/u in 1 month      Kobi Aller Robyne Peers, DO [facility]

## 2020-09-17 NOTE — Assessment & Plan Note (Addendum)
-  PHQ-9 score of 3 with negative question 9 -through shared decision making, decided that patient can restart citalopram at her request but encouraged to take with food and never on an empty stomach, also explained that this medication may take about 4-6 weeks to truly take into effect, patient voices understanding and agreeable to plan -patient declines medication change at this time -encouraged therapy if needed at a later time -f/u in 1 month

## 2020-09-17 NOTE — Patient Instructions (Signed)
It was great seeing you today!  Today we discussed your mood, it seems that the medication is helping but since it is causing you some mild nausea, please make sure to take it with food daily and never on an empty stomach.   Please follow up in 1 month for your next scheduled appointment, if anything arises between now and then, please don't hesitate to contact our office.   Thank you for allowing [place] to be a part of your medical care!  Thank you, Dr. Robyne Peers  Citalopram tablets What is this medicine? CITALOPRAM (sye TAL oh pram) is a medicine for depression. This medicine may be used for other purposes; ask your health care provider or pharmacist if you have questions. COMMON BRAND NAME(S): Celexa What should I tell my health care provider before I take this medicine? They need to know if you have any of these conditions:  bleeding disorders  bipolar disorder or a family history of bipolar disorder  glaucoma  heart disease  history of irregular heartbeat  kidney disease  liver disease  low levels of magnesium or potassium in the blood  receiving electroconvulsive therapy  seizures  suicidal thoughts, plans, or attempt; a previous suicide attempt by you or a family member  take medicines that treat or prevent blood clots  thyroid disease  an unusual or allergic reaction to citalopram, escitalopram, other medicines, foods, dyes, or preservatives  pregnant or trying to become pregnant  breast-feeding How should I use this medicine? Take this medicine by mouth with a glass of water. Follow the directions on the prescription label. You can take it with or without food. Take your medicine at regular intervals. Do not take your medicine more often than directed. Do not stop taking this medicine suddenly except upon the advice of your doctor. Stopping this medicine too quickly may cause serious side effects or your condition may worsen. A special MedGuide will be given to  you by the pharmacist with each prescription and refill. Be sure to read this information carefully each time. Talk to your pediatrician regarding the use of this medicine in children. Special care may be needed. Patients over 76 years old may have a stronger reaction and need a smaller dose. Overdosage: If you think you have taken too much of this medicine contact a poison control center or emergency room at once. NOTE: This medicine is only for you. Do not share this medicine with others. What if I miss a dose? If you miss a dose, take it as soon as you can. If it is almost time for your next dose, take only that dose. Do not take double or extra doses. What may interact with this medicine? Do not take this medicine with any of the following medications:  certain medicines for fungal infections like fluconazole, itraconazole, ketoconazole, posaconazole, voriconazole  cisapride  dronedarone  escitalopram  linezolid  MAOIs like Carbex, Eldepryl, Marplan, Nardil, and Parnate  methylene blue (injected into a vein)  pimozide  thioridazine This medicine may also interact with the following medications:  alcohol  amphetamines  aspirin and aspirin-like medicines  carbamazepine  certain medicines for depression, anxiety, or psychotic disturbances  certain medicines for infections like chloroquine, clarithromycin, erythromycin, furazolidone, isoniazid, pentamidine  certain medicines for migraine headaches like almotriptan, eletriptan, frovatriptan, naratriptan, rizatriptan, sumatriptan, zolmitriptan  certain medicines for sleep  certain medicines that treat or prevent blood clots like dalteparin, enoxaparin, warfarin  cimetidine  diuretics  dofetilide  fentanyl  lithium  methadone  metoprolol  NSAIDs, medicines for pain and inflammation, like ibuprofen or naproxen  omeprazole  other medicines that prolong the QT interval (cause an abnormal heart  rhythm)  procarbazine  rasagiline  supplements like St. John's wort, kava kava, valerian  tramadol  tryptophan  ziprasidone This list may not describe all possible interactions. Give your health care provider a list of all the medicines, herbs, non-prescription drugs, or dietary supplements you use. Also tell them if you smoke, drink alcohol, or use illegal drugs. Some items may interact with your medicine. What should I watch for while using this medicine? Tell your doctor if your symptoms do not get better or if they get worse. Visit your doctor or health care professional for regular checks on your progress. Because it may take several weeks to see the full effects of this medicine, it is important to continue your treatment as prescribed by your doctor. Patients and their families should watch out for new or worsening thoughts of suicide or depression. Also watch out for sudden changes in feelings such as feeling anxious, agitated, panicky, irritable, hostile, aggressive, impulsive, severely restless, overly excited and hyperactive, or not being able to sleep. If this happens, especially at the beginning of treatment or after a change in dose, call your health care professional. Bonita Quin may get drowsy or dizzy. Do not drive, use machinery, or do anything that needs mental alertness until you know how this medicine affects you. Do not stand or sit up quickly, especially if you are an older patient. This reduces the risk of dizzy or fainting spells. Alcohol may interfere with the effect of this medicine. Avoid alcoholic drinks. Your mouth may get dry. Chewing sugarless gum or sucking hard candy, and drinking plenty of water will help. Contact your doctor if the problem does not go away or is severe. What side effects may I notice from receiving this medicine? Side effects that you should report to your doctor or health care professional as soon as possible:  allergic reactions like skin rash,  itching or hives, swelling of the face, lips, or tongue  anxious  black, tarry stools  breathing problems  changes in vision  chest pain  confusion  elevated mood, decreased need for sleep, racing thoughts, impulsive behavior  eye pain  fast, irregular heartbeat  feeling faint or lightheaded, falls  feeling agitated, angry, or irritable  hallucination, loss of contact with reality  loss of balance or coordination  loss of memory  painful or prolonged erections  restlessness, pacing, inability to keep still  seizures  stiff muscles  suicidal thoughts or other mood changes  trouble sleeping  unusual bleeding or bruising  unusually weak or tired  vomiting Side effects that usually do not require medical attention (report to your doctor or health care professional if they continue or are bothersome):  change in appetite or weight  change in sex drive or performance  dizziness  headache  increased sweating  indigestion, nausea  tremors This list may not describe all possible side effects. Call your doctor for medical advice about side effects. You may report side effects to FDA at 1-800-FDA-1088. Where should I keep my medicine? Keep out of reach of children. Store at room temperature between 15 and 30 degrees C (59 and 86 degrees F). Throw away any unused medicine after the expiration date. NOTE: This sheet is a summary. It may not cover all possible information. If you have questions about this medicine, talk to your doctor, pharmacist, or  health care provider.  2021 Elsevier/Gold Standard (2018-03-22 09:05:36)

## 2020-09-26 DIAGNOSIS — E7849 Other hyperlipidemia: Secondary | ICD-10-CM | POA: Diagnosis not present

## 2020-09-26 DIAGNOSIS — I10 Essential (primary) hypertension: Secondary | ICD-10-CM | POA: Diagnosis not present

## 2020-09-26 DIAGNOSIS — E119 Type 2 diabetes mellitus without complications: Secondary | ICD-10-CM | POA: Diagnosis not present

## 2020-09-26 DIAGNOSIS — I5022 Chronic systolic (congestive) heart failure: Secondary | ICD-10-CM | POA: Diagnosis not present

## 2020-10-30 ENCOUNTER — Ambulatory Visit (INDEPENDENT_AMBULATORY_CARE_PROVIDER_SITE_OTHER): Payer: Medicare HMO | Admitting: Family Medicine

## 2020-10-30 ENCOUNTER — Other Ambulatory Visit: Payer: Self-pay

## 2020-10-30 VITALS — BP 130/64 | HR 75 | Ht 65.0 in | Wt 145.1 lb

## 2020-10-30 DIAGNOSIS — F32A Depression, unspecified: Secondary | ICD-10-CM

## 2020-10-30 DIAGNOSIS — I10 Essential (primary) hypertension: Secondary | ICD-10-CM | POA: Diagnosis not present

## 2020-10-30 DIAGNOSIS — E119 Type 2 diabetes mellitus without complications: Secondary | ICD-10-CM | POA: Diagnosis not present

## 2020-10-30 LAB — POCT GLYCOSYLATED HEMOGLOBIN (HGB A1C): HbA1c, POC (controlled diabetic range): 5.9 % (ref 0.0–7.0)

## 2020-10-30 NOTE — Assessment & Plan Note (Addendum)
-  PHQ-9 score of 0 -noted to have significant improvement -reassurance provided -prior therapy resources provided if patient decides to see therapy  -f/u as appropriate

## 2020-10-30 NOTE — Assessment & Plan Note (Signed)
-  A1c 5.9, at goal, discussed with patient -continue metformin as previously prescribed -f/u in 3 months for repeat A1c

## 2020-10-30 NOTE — Assessment & Plan Note (Signed)
-  BP 130/64, at goal -continue anti-hypertensives as prescribed -diet and exercise counseling provided

## 2020-10-30 NOTE — Patient Instructions (Signed)
It was great seeing you today!  Today we discussed your mood, diabetes and high blood pressure. I am glad you are doing well in all these aspects. Continue to utilize your support system and do the things we talked about including stretches and exercising regularly.   Please follow up at your next scheduled appointment in 3 months for another glucose check, if anything arises between now and then, please don't hesitate to contact our office.   Thank you for allowing [place] to be a part of your medical care!  Thank you, Dr. Robyne Peers

## 2020-10-30 NOTE — Progress Notes (Signed)
    SUBJECTIVE:   CHIEF COMPLAINT / HPI:   Depression  Endorses feeling well recently, experiencing significantly improved mood. States that she has less episodes of feeling down and depressed. Previously prescribed citalopram but states that she has not been taking it because she does not need it. Support system includes her daughter and grandson who she lives with. Does yoga and stretching exercises daily. States that she had been previously feeling more depressed which she believes is due to her husband's death and finding a place to stay but now that more time has passed and her living situation improved with her daughter. Still feels that she does not need therapy at this time. Denies suicidal ideations.   PERTINENT  PMH / PSH:   Type 2 DM Due for A1c today, last one was 5.7.  Compliant on metformin 1000 mg bid daily. Denies weakness or fatigue to suggest hypoglycemic episodes. Eating healthy and able to walk more than she used to. Last seen by ophthalmologist less than a year ago towards the end of 2021.    Hypertension  Compliant on carvedilol, hydralazine, amlodipine and lasix daily. Denies chest pain, dyspnea and palpitations. No concerns regarding this at the time as she is continuing to make and maintain healthy lifestyle modifications.   OBJECTIVE:   BP 130/64 Comment: LEFT ARM  Pulse 75   Ht 5\' 5"  (1.651 m)   Wt 145 lb 2 oz (65.8 kg)   SpO2 98%   BMI 24.15 kg/m   General: Patient well-appearing, in no acute distress. CV: RRR, no murmurs or gallops auscultated  Resp: CTAB, no rales or rhonchi  Abdomen: soft, nontender, nondistended, presence of bowel sounds Ext: radial and distal pulses strong and equal bilaterally, no LE edema noted bilaterally Psych: mood appropriate, denies SI   ASSESSMENT/PLAN:   Depression -PHQ-9 score of 0 -noted to have significant improvement -reassurance provided -prior therapy resources provided if patient decides to see therapy  -f/u as  appropriate   Diabetes mellitus type 2 in nonobese (HCC) -A1c 5.9, at goal, discussed with patient -continue metformin as previously prescribed -f/u in 3 months for repeat A1c  Essential hypertension -BP 130/64, at goal -continue anti-hypertensives as prescribed -diet and exercise counseling provided     , DO [facility]

## 2020-11-28 ENCOUNTER — Other Ambulatory Visit: Payer: Self-pay | Admitting: Family Medicine

## 2020-11-28 DIAGNOSIS — F32A Depression, unspecified: Secondary | ICD-10-CM

## 2020-12-13 DIAGNOSIS — I10 Essential (primary) hypertension: Secondary | ICD-10-CM | POA: Diagnosis not present

## 2020-12-13 DIAGNOSIS — E7849 Other hyperlipidemia: Secondary | ICD-10-CM | POA: Diagnosis not present

## 2020-12-13 DIAGNOSIS — I5022 Chronic systolic (congestive) heart failure: Secondary | ICD-10-CM | POA: Diagnosis not present

## 2020-12-13 DIAGNOSIS — E119 Type 2 diabetes mellitus without complications: Secondary | ICD-10-CM | POA: Diagnosis not present

## 2020-12-13 DIAGNOSIS — I2510 Atherosclerotic heart disease of native coronary artery without angina pectoris: Secondary | ICD-10-CM | POA: Diagnosis not present

## 2021-04-03 ENCOUNTER — Other Ambulatory Visit: Payer: Self-pay

## 2021-04-03 ENCOUNTER — Other Ambulatory Visit: Payer: Self-pay | Admitting: Family Medicine

## 2021-04-03 DIAGNOSIS — G4700 Insomnia, unspecified: Secondary | ICD-10-CM

## 2021-04-03 DIAGNOSIS — M19042 Primary osteoarthritis, left hand: Secondary | ICD-10-CM

## 2021-04-03 MED ORDER — DICLOFENAC SODIUM 1 % EX GEL: 2.0000 g | Freq: Four times a day (QID) | CUTANEOUS | 1 refills | Status: DC

## 2021-04-03 MED ORDER — MELATONIN 3 MG PO TABS: 3.0000 mg | ORAL_TABLET | Freq: Every day | ORAL | 0 refills | Status: DC

## 2021-04-03 NOTE — Telephone Encounter (Signed)
Patient calls nurse line requesting refills on diclofenac gel and melatonin.   Melatonin is not on current medication list.   Please advise.   Veronda Prude, RN

## 2021-04-03 NOTE — Telephone Encounter (Signed)
Patient LVM on nurse line requesting a refill on Voltaren Gel. Patient also requests a prescription for Melatonin to help her sleep.   Patient has an apt scheduled with PCP on 1/13.  Please advise.

## 2021-04-10 ENCOUNTER — Other Ambulatory Visit: Payer: Self-pay | Admitting: Family Medicine

## 2021-04-10 DIAGNOSIS — M19041 Primary osteoarthritis, right hand: Secondary | ICD-10-CM

## 2021-04-26 ENCOUNTER — Ambulatory Visit: Payer: Medicare HMO | Admitting: Family Medicine

## 2021-06-20 ENCOUNTER — Telehealth: Payer: Self-pay

## 2021-06-20 NOTE — Telephone Encounter (Signed)
Patient contacted to schedule mammogram.  ? ?RE: Mobile Mammo event located at: ? ?[facility]  ?      ?[address]    ?[place] [id number]    ? ?Date: April 7th  ? ?Patient declined event at this time due to transportation concerns.  ?

## 2021-07-27 ENCOUNTER — Other Ambulatory Visit: Payer: Self-pay | Admitting: Family Medicine

## 2021-07-27 DIAGNOSIS — M19042 Primary osteoarthritis, left hand: Secondary | ICD-10-CM

## 2021-07-27 DIAGNOSIS — M19041 Primary osteoarthritis, right hand: Secondary | ICD-10-CM

## 2021-07-27 DIAGNOSIS — G4700 Insomnia, unspecified: Secondary | ICD-10-CM

## 2021-08-23 ENCOUNTER — Other Ambulatory Visit: Payer: Self-pay | Admitting: Family Medicine

## 2021-08-23 DIAGNOSIS — M19041 Primary osteoarthritis, right hand: Secondary | ICD-10-CM

## 2021-10-19 ENCOUNTER — Other Ambulatory Visit: Payer: Self-pay | Admitting: Family Medicine

## 2021-10-19 DIAGNOSIS — M19041 Primary osteoarthritis, right hand: Secondary | ICD-10-CM

## 2022-01-10 DIAGNOSIS — R079 Chest pain, unspecified: Secondary | ICD-10-CM | POA: Diagnosis not present

## 2022-03-15 ENCOUNTER — Other Ambulatory Visit: Payer: Self-pay | Admitting: Family Medicine

## 2022-03-15 DIAGNOSIS — G4700 Insomnia, unspecified: Secondary | ICD-10-CM

## 2022-03-19 ENCOUNTER — Other Ambulatory Visit: Payer: Self-pay | Admitting: Family Medicine

## 2022-03-19 DIAGNOSIS — M19041 Primary osteoarthritis, right hand: Secondary | ICD-10-CM

## 2022-04-17 ENCOUNTER — Encounter: Payer: Self-pay | Admitting: Nurse Practitioner

## 2022-04-17 ENCOUNTER — Ambulatory Visit (INDEPENDENT_AMBULATORY_CARE_PROVIDER_SITE_OTHER): Payer: Medicare PPO | Admitting: Nurse Practitioner

## 2022-04-17 VITALS — BP 150/80 | HR 77 | Temp 96.8°F | Ht 65.0 in | Wt 123.8 lb

## 2022-04-17 DIAGNOSIS — F320 Major depressive disorder, single episode, mild: Secondary | ICD-10-CM | POA: Diagnosis not present

## 2022-04-17 DIAGNOSIS — I10 Essential (primary) hypertension: Secondary | ICD-10-CM

## 2022-04-17 DIAGNOSIS — Z23 Encounter for immunization: Secondary | ICD-10-CM | POA: Diagnosis not present

## 2022-04-17 DIAGNOSIS — E785 Hyperlipidemia, unspecified: Secondary | ICD-10-CM

## 2022-04-17 DIAGNOSIS — R202 Paresthesia of skin: Secondary | ICD-10-CM | POA: Diagnosis not present

## 2022-04-17 DIAGNOSIS — Z8673 Personal history of transient ischemic attack (TIA), and cerebral infarction without residual deficits: Secondary | ICD-10-CM

## 2022-04-17 DIAGNOSIS — I5032 Chronic diastolic (congestive) heart failure: Secondary | ICD-10-CM | POA: Insufficient documentation

## 2022-04-17 DIAGNOSIS — I5042 Chronic combined systolic (congestive) and diastolic (congestive) heart failure: Secondary | ICD-10-CM

## 2022-04-17 DIAGNOSIS — E1140 Type 2 diabetes mellitus with diabetic neuropathy, unspecified: Secondary | ICD-10-CM | POA: Diagnosis not present

## 2022-04-17 DIAGNOSIS — E1169 Type 2 diabetes mellitus with other specified complication: Secondary | ICD-10-CM | POA: Diagnosis not present

## 2022-04-17 DIAGNOSIS — R200 Anesthesia of skin: Secondary | ICD-10-CM | POA: Diagnosis not present

## 2022-04-17 DIAGNOSIS — Z59811 Housing instability, housed, with risk of homelessness: Secondary | ICD-10-CM

## 2022-04-17 LAB — COMPREHENSIVE METABOLIC PANEL
ALT: 9 U/L (ref 0–35)
AST: 13 U/L (ref 0–37)
Albumin: 4.2 g/dL (ref 3.5–5.2)
Alkaline Phosphatase: 74 U/L (ref 39–117)
BUN: 13 mg/dL (ref 6–23)
CO2: 28 mEq/L (ref 19–32)
Calcium: 9.1 mg/dL (ref 8.4–10.5)
Chloride: 104 mEq/L (ref 96–112)
Creatinine, Ser: 0.9 mg/dL (ref 0.40–1.20)
GFR: 64.69 mL/min (ref >60.00)
Glucose, Bld: 110 mg/dL — ABNORMAL HIGH (ref 70–99)
Potassium: 4 mEq/L (ref 3.5–5.1)
Sodium: 140 mEq/L (ref 135–145)
Total Bilirubin: 0.4 mg/dL (ref 0.2–1.2)
Total Protein: 7.2 g/dL (ref 6.0–8.3)

## 2022-04-17 LAB — TSH: TSH: 0.61 u[IU]/mL (ref 0.35–5.50)

## 2022-04-17 LAB — CBC WITH DIFFERENTIAL/PLATELET
Basophils Absolute: 0 10*3/uL (ref 0.0–0.1)
Basophils Relative: 0.9 % (ref 0.0–3.0)
Eosinophils Absolute: 0.1 10*3/uL (ref 0.0–0.7)
Eosinophils Relative: 1.2 % (ref 0.0–5.0)
HCT: 37.7 % (ref 36.0–46.0)
Hemoglobin: 12.7 g/dL (ref 12.0–15.0)
Lymphocytes Relative: 24.9 % (ref 12.0–46.0)
Lymphs Abs: 1.3 10*3/uL (ref 0.7–4.0)
MCHC: 33.8 g/dL (ref 30.0–36.0)
MCV: 98.3 fl (ref 78.0–100.0)
Monocytes Absolute: 0.5 10*3/uL (ref 0.1–1.0)
Monocytes Relative: 8.7 % (ref 3.0–12.0)
Neutro Abs: 3.3 10*3/uL (ref 1.4–7.7)
Neutrophils Relative %: 64.3 % (ref 43.0–77.0)
Platelets: 261 10*3/uL (ref 150.0–400.0)
RBC: 3.83 Mil/uL — ABNORMAL LOW (ref 3.87–5.11)
RDW: 13.6 % (ref 11.5–15.5)
WBC: 5.2 10*3/uL (ref 4.0–10.5)

## 2022-04-17 LAB — LIPID PANEL
Cholesterol: 157 mg/dL (ref 0–200)
HDL: 68.4 mg/dL (ref >39.00)
LDL Cholesterol: 77 mg/dL (ref 0–99)
NonHDL: 89.06
Total CHOL/HDL Ratio: 2
Triglycerides: 62 mg/dL (ref 0.0–149.0)
VLDL: 12.4 mg/dL (ref 0.0–40.0)

## 2022-04-17 LAB — MICROALBUMIN / CREATININE URINE RATIO
Creatinine,U: 62.3 mg/dL
Microalb Creat Ratio: 1.2 mg/g (ref 0.0–30.0)
Microalb, Ur: 0.8 mg/dL (ref 0.0–1.9)

## 2022-04-17 LAB — VITAMIN B12: Vitamin B-12: 231 pg/mL (ref 211–911)

## 2022-04-17 LAB — HEMOGLOBIN A1C: Hgb A1c MFr Bld: 5.8 % (ref 4.6–6.5)

## 2022-04-17 MED ORDER — ONETOUCH ULTRA 2 W/DEVICE KIT: 1.0000 | PACK | Freq: Every day | 0 refills | Status: DC

## 2022-04-17 MED ORDER — ONETOUCH ULTRA VI STRP: ORAL_STRIP | 12 refills | Status: AC

## 2022-04-17 MED ORDER — CITALOPRAM HYDROBROMIDE 20 MG PO TABS: 20.0000 mg | ORAL_TABLET | Freq: Every day | ORAL | 1 refills | Status: DC

## 2022-04-17 MED ORDER — GABAPENTIN 100 MG PO CAPS: 100.0000 mg | ORAL_CAPSULE | Freq: Every day | ORAL | 0 refills | Status: DC

## 2022-04-17 NOTE — Assessment & Plan Note (Signed)
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  Will check a vitamin B12 today.  Start gabapentin 100 mg at bedtime.

## 2022-04-17 NOTE — Assessment & Plan Note (Signed)
Chronic, stable 

## 2022-04-17 NOTE — Assessment & Plan Note (Signed)
Her blood sugars had been controlled, however has not been to a healthcare provider in over a year.  Will check A1c, CMP, CBC, urine microalbumin today.  Will have her continue metformin 250 mg twice daily.  Recommend that she schedule an eye exam when able.  Foot exam done today does show neuropathy.  We are having her start gabapentin 100 mg at bedtime.  Will also check vitamin B12 levels today.  Follow-up in 3 months.  Will also place a referral to [facility] for RN for education.

## 2022-04-17 NOTE — Assessment & Plan Note (Signed)
She states that since her husband passed away in 20-Jul-2011, she has been living with family or she was homeless on the street.  She states that for the last year she was homeless until her cousin took her in a few months ago.  She is now able to eat 3 meals again daily.  She is still concerned about housing and is worried if her cousin will kick her out at some point like all of her other family did.  Will place a referral to [facility] for social work.

## 2022-04-17 NOTE — Patient Instructions (Signed)
It was great to see you!  Start gabapentin 1 capsule at bedtime for your hands and feet.   I have refilled your celexa to take once a day for your mood.   We are checking your labs today and will let you know the results via mychart/phone.   Let's follow-up in 3 months, sooner if you have concerns.  If a referral was placed today, you will be contacted for an appointment. Please note that routine referrals can sometimes take up to 3-4 weeks to process. Please call our office if you haven't heard anything after this time frame.  Take care,  Vance Peper, NP

## 2022-04-17 NOTE — Assessment & Plan Note (Signed)
She is currently taking atorvastatin 40 mg daily.  Will check CMP, CBC, lipid panel today.

## 2022-04-17 NOTE — Assessment & Plan Note (Signed)
Chronic, slightly elevated today.  She was taking amlodipine 10 mg at 1 point, however she started getting dizzy and was decreased down to 5.  Will have her continue her current regimen for now, including amlodipine 5 mg daily, carvedilol 12.5 mg twice daily, hydralazine 25 mg twice daily, and Entresto 49-51 mg twice daily.  Check CMP, CBC today.  Follow-up in 3 months.

## 2022-04-17 NOTE — Assessment & Plan Note (Signed)
She has a history of a right cerebellar stroke in 2013.  She has some mild memory impairment, otherwise no ongoing symptoms.  She is currently taking atorvastatin 40 mg daily, along with Plavix 75 mg daily and aspirin 81 mg daily.

## 2022-04-17 NOTE — Assessment & Plan Note (Signed)
Chronic, ongoing.  Her PHQ-9 was a 3 today.  She was taking Celexa in the past which she states did help her mood, however she has not had any refills of this recently.  Will have her restart her Celexa 20 mg daily.  Follow-up in 3 months.

## 2022-04-17 NOTE — Assessment & Plan Note (Signed)
Chronic, stable.  She is euvolemic on exam today.  Her last echocardiogram in 2020 showed an EF of 25-30%. It also showed moderate diastolic dysfunction, mild to moderate mitral valve regurgitation and mild tricupsid valve regurgitation with moderate pulmonary hypertension.  She is currently following with Dr. Doylene Canard with cardiology.  Will request records from him.  She is currently taking Plavix 75 mg daily and aspirin 81 mg daily.  Follow-up in 3 months.

## 2022-04-17 NOTE — Progress Notes (Signed)
New Patient Visit  BP (!) 150/80   Pulse 77   Temp (!) 96.8 F (36 C)   Ht _0  (1.651 m)   Wt 123 lb 12.8 oz (56.2 kg)   SpO2 98%   BMI 20.60 kg/m    Subjective:    Patient ID: Lisa [name], female    DOB: 1950-12-07, 72 y.o.   MRN: [id number]  CC: Chief Complaint  Patient presents with   New Patient (Initial Visit)    New patient , having numbness , pain  hands and feet  going for couple of months , pt need another glucose meter     HPI: Lisa [name] is a 72 y.o. female presents for new patient visit to establish care.  Introduced to Designer, jewellery role and practice setting.  All questions answered.  Discussed provider/patient relationship and expectations.  Lisa [name] is looking to establish care with a new provider as she has been homeless for the last year and wasn't following her previous provider anymore. She states that she was living with her husband until he passed away in 06/26/2011. Then, she was living off and on with different family members. When she didn't have any money left to give the family member she was living with, they would kick her out. Currently, she is living with her cousin who is also providing her with 3 meals a day. She has income from her husband's retirement. She is hoping to get financially stable and move out on her own again.   She has a history of diabetes and is taking metformin 2104m BID. She had been checking her blood sugars, however she feels like the machine isn't accurate anymore. She denies chest pain and shortness of breath. She states that she started having some numbness and tingling in her hands and feet 5 months ago. She has not been to an eye doctor in several years.   She has a history of a stroke in 203/14/13 She is currently taking plavix and aspirin daily. She has some short term forgetfulness since the stroke, but otherwise is ok. She denies falls recently.   She has a history of chronic heart failure with reduced ejection  fraction. Her last echocardiogram in 22020/03/13showed an EF of 25-30%. It also showed moderate diastolic dysfunction, mild to moderate mitral valve regurgitation and mild tricupsid valve regurgitation with moderate pulmonary hypertension. She states that she is currently following with cardiology. She is taking amlodipine 558mdaily, carvedilol 12.45m61mID, hydralazine 245m54mD, and entresto 49-51 mg BID.   She also has a history of depression. She was taking celexa at one point and would like to restart this medication. She denies SI/HI.      04/17/2022    1:29 PM 10/30/2020    9:19 AM 09/17/2020    2:48 PM 08/29/2020    1:50 PM 07/12/2020   10:03 AM  Depression screen PHQ 2/9  Decreased Interest 0 0 0 0 3  Down, Depressed, Hopeless 0 0 0 0 0  PHQ - 2 Score 0 0 0 0 3  Altered sleeping 3  3 0 3  Tired, decreased energy  0 0 0 0  Change in appetite 0 0 0 0 0  Feeling bad or failure about yourself  0 0 0 0 0  Trouble concentrating 0 0 0 0 0  Moving slowly or fidgety/restless 0 0 0 0 0  Suicidal thoughts 0 0 0 0 0  PHQ-9 Score 3  3 0  6  Difficult doing work/chores Not difficult at all Not difficult at all         04/17/2022    1:29 PM  GAD 7 : Generalized Anxiety Score  Nervous, Anxious, on Edge 0  Control/stop worrying 0  Worry too much - different things 0  Trouble relaxing 0  Restless 0  Easily annoyed or irritable 0  Afraid - awful might happen 0  Total GAD 7 Score 0  Anxiety Difficulty Not difficult at all     Past Medical History:  Diagnosis Date   ARF (acute renal failure) ([place]) 11/18/2018   Diabetes mellitus ([place]) 2007   High cholesterol 2007   Hypertension    Peptic ulcer 1965   Stroke [facility])     Past Surgical History:  Procedure Laterality Date   CESAREAN SECTION     RIGHT/LEFT HEART CATH AND CORONARY ANGIOGRAPHY N/A 11/19/2018   Procedure: RIGHT/LEFT HEART CATH AND CORONARY ANGIOGRAPHY;  Surgeon: Dixie Dials, MD;  Location: [facility];  Service: Cardiovascular;   Laterality: N/A;    Family History  Problem Relation Age of Onset   Kidney disease Father    Heart disease Father    Cancer Brother        Throat    Birth defects Son      Social History   Tobacco Use   Smoking status: Former    Packs/day: 0.25    Years: 25.00    Total pack years: 6.25    Types: Cigarettes    Quit date: 04/07/2013    Years since quitting: 9.0   Smokeless tobacco: Former    Quit date: 04/07/2013   Tobacco comments:    2 cigs a day  Vaping Use   Vaping Use: Never used  Substance Use Topics   Alcohol use: Yes    Comment: Socially, drinks 3 drinks twice per week    Drug use: No    Current Outpatient Medications on File Prior to Visit  Medication Sig Dispense Refill   Alcohol Swabs (DROPSAFE ALCOHOL PREP) 70 % PADS Apply topically.     amLODipine (NORVASC) 5 MG tablet Take 5 mg by mouth daily.     aspirin 81 MG chewable tablet Chew 81 mg by mouth daily.     atorvastatin (LIPITOR) 40 MG tablet Take 40 mg by mouth daily.     carvedilol (COREG) 12.5 MG tablet Take 12.5 mg by mouth 2 (two) times daily with a meal.     clopidogrel (PLAVIX) 75 MG tablet Take 75 mg by mouth daily.     diclofenac Sodium (VOLTAREN) 1 % GEL APPLY 2 GRAMS TOPICALLY FOUR TIMES DAILY 400 g 3   hydrALAZINE (APRESOLINE) 25 MG tablet Take 25 mg by mouth 2 (two) times daily.     melatonin 3 MG TABS tablet TAKE 1 TABLET AT BEDTIME 90 tablet 3   metFORMIN (GLUCOPHAGE) 500 MG tablet Take 250 mg by mouth 2 (two) times daily with a meal.     sacubitril-valsartan (ENTRESTO) 49-51 MG Take 1 tablet by mouth 2 (two) times daily.     No current facility-administered medications on file prior to visit.     Review of Systems  Constitutional:  Positive for fatigue.  HENT: Negative.    Respiratory: Negative.    Cardiovascular: Negative.   Gastrointestinal: Negative.   Genitourinary: Negative.   Musculoskeletal: Negative.   Skin: Negative.   Neurological:  Positive for numbness (and tingling  in hands and feet).  Psychiatric/Behavioral:  Positive for dysphoric mood. The patient is not nervous/anxious.       Objective:    BP (!) 150/80   Pulse 77   Temp (!) 96.8 F (36 C)   Ht _0  (1.651 m)   Wt 123 lb 12.8 oz (56.2 kg)   SpO2 98%   BMI 20.60 kg/m   Wt Readings from Last 3 Encounters:  04/17/22 123 lb 12.8 oz (56.2 kg)  10/30/20 145 lb 2 oz (65.8 kg)  09/17/20 146 lb (66.2 kg)    BP Readings from Last 3 Encounters:  04/17/22 (!) 150/80  10/30/20 130/64  09/17/20 124/68    Physical Exam Vitals and nursing note reviewed.  Constitutional:      General: She is not in acute distress.    Appearance: Normal appearance.  HENT:     Head: Normocephalic and atraumatic.     Right Ear: Tympanic membrane, ear canal and external ear normal.     Left Ear: Tympanic membrane, ear canal and external ear normal.  Eyes:     Conjunctiva/sclera: Conjunctivae normal.  Cardiovascular:     Rate and Rhythm: Normal rate and regular rhythm.     Pulses: Normal pulses.     Heart sounds: Normal heart sounds.  Pulmonary:     Effort: Pulmonary effort is normal.     Breath sounds: Normal breath sounds.  Abdominal:     Palpations: Abdomen is soft.     Tenderness: There is no abdominal tenderness.  Musculoskeletal:        General: Normal range of motion.     Cervical back: Normal range of motion and neck supple.     Right lower leg: No edema.     Left lower leg: No edema.  Lymphadenopathy:     Cervical: No cervical adenopathy.  Skin:    General: Skin is warm and dry.  Neurological:     General: No focal deficit present.     Mental Status: She is alert and oriented to person, place, and time.     Cranial Nerves: No cranial nerve deficit.     Coordination: Coordination normal.     Gait: Gait normal.  Psychiatric:        Mood and Affect: Mood normal.        Behavior: Behavior normal.        Thought Content: Thought content normal.        Judgment: Judgment normal.    Diabetic  Foot Exam - Simple   Simple Foot Form Visual Inspection No deformities, no ulcerations, no other skin breakdown bilaterally: Yes Sensation Testing See comments: Yes Pulse Check Posterior Tibialis and Dorsalis pulse intact bilaterally: Yes Comments Did not feel some areas on monofilament bilaterally.        Assessment & Plan:   Problem List Items Addressed This Visit       Cardiovascular and Mediastinum   Essential hypertension    Chronic, slightly elevated today.  She was taking amlodipine 10 mg at 1 point, however she started getting dizzy and was decreased down to 5.  Will have her continue her current regimen for now, including amlodipine 5 mg daily, carvedilol 12.5 mg twice daily, hydralazine 25 mg twice daily, and Entresto 49-51 mg twice daily.  Check CMP, CBC today.  Follow-up in 3 months.      Relevant Medications   amLODipine (NORVASC) 5 MG tablet   sacubitril-valsartan (ENTRESTO) 49-51 MG   Other Relevant Orders   CBC with Differential/Platelet  Comprehensive metabolic panel   Lipid panel   AMB Referral to Chronic Care Management Services   Chronic combined systolic and diastolic heart failure (HCC)    Chronic, stable.  She is euvolemic on exam today.  Her last echocardiogram in 06/03/18 showed an EF of 25-30%. It also showed moderate diastolic dysfunction, mild to moderate mitral valve regurgitation and mild tricupsid valve regurgitation with moderate pulmonary hypertension.  She is currently following with Dr. Doylene Canard with cardiology.  Will request records from him.  She is currently taking Plavix 75 mg daily and aspirin 81 mg daily.  Follow-up in 3 months.      Relevant Medications   amLODipine (NORVASC) 5 MG tablet   sacubitril-valsartan (ENTRESTO) 49-51 MG     Endocrine   Hyperlipidemia associated with type 2 diabetes mellitus ([place])    She is currently taking atorvastatin 40 mg daily.  Will check CMP, CBC, lipid panel today.      Relevant Medications    amLODipine (NORVASC) 5 MG tablet   sacubitril-valsartan (ENTRESTO) 49-51 MG   metFORMIN (GLUCOPHAGE) 500 MG tablet   Other Relevant Orders   CBC with Differential/Platelet   Comprehensive metabolic panel   Lipid panel   Controlled type 2 diabetes with neuropathy ([place]) - Primary    Her blood sugars had been controlled, however has not been to a healthcare provider in over a year.  Will check A1c, CMP, CBC, urine microalbumin today.  Will have her continue metformin 250 mg twice daily.  Recommend that she schedule an eye exam when able.  Foot exam done today does show neuropathy.  We are having her start gabapentin 100 mg at bedtime.  Will also check vitamin B12 levels today.  Follow-up in 3 months.  Will also place a referral to CCM for RN for education.      Relevant Medications   metFORMIN (GLUCOPHAGE) 500 MG tablet   Blood Glucose Monitoring Suppl (ONE TOUCH ULTRA 2) w/Device KIT   glucose blood (ONETOUCH ULTRA) test strip   Other Relevant Orders   CBC with Differential/Platelet   Comprehensive metabolic panel   Lipid panel   Hemoglobin A1c   Microalbumin / creatinine urine ratio   AMB Referral to Chronic Care Management Services     Other   History of CVA (cerebrovascular accident)    She has a history of a right cerebellar stroke in 06-04-11.  She has some mild memory impairment, otherwise no ongoing symptoms.  She is currently taking atorvastatin 40 mg daily, along with Plavix 75 mg daily and aspirin 81 mg daily.      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  Will check a vitamin B12 today.  Start gabapentin 100 mg at bedtime.      Relevant Orders   Vitamin B12   TSH   Depression, major, single episode, mild (HCC)    Chronic, ongoing.  Her PHQ-9 was a 3 today.  She was taking Celexa in the past which she states did help her mood, however she has not had any refills of this recently.  Will have her restart her Celexa 20 mg daily.  Follow-up in 3  months.      Relevant Medications   citalopram (CELEXA) 20 MG tablet   Housing instability, currently housed, at risk for homelessness    She states that since her husband passed away in 2011-06-04, she has been living with family or she was homeless on the street.  She states that for the last year she was homeless until her cousin took her in a few months ago.  She is now able to eat 3 meals again daily.  She is still concerned about housing and is worried if her cousin will kick her out at some point like all of her other family did.  Will place a referral to [facility] for social work.      Relevant Orders   AMB Referral to Chronic Care Management Services   Other Visit Diagnoses     Need for influenza vaccination       Flu vaccine given today   Relevant Orders   Flu Vaccine QUAD High Dose(Fluad) (Completed)        Follow up plan: Return in about 4 weeks (around 05/15/2022) for neuropathy .

## 2022-04-18 ENCOUNTER — Telehealth: Payer: Self-pay

## 2022-04-18 NOTE — Telephone Encounter (Signed)
-----   Message from Charyl Dancer, NP sent at 04/17/2022  9:11 PM EST ----- Please call patient and let her know that her labs look great! Keep taking her current medications.

## 2022-04-18 NOTE — Telephone Encounter (Signed)
I left a message for the patient to return my call.

## 2022-04-24 ENCOUNTER — Ambulatory Visit (INDEPENDENT_AMBULATORY_CARE_PROVIDER_SITE_OTHER): Payer: Medicare PPO

## 2022-04-24 ENCOUNTER — Telehealth: Payer: Medicare PPO

## 2022-04-24 DIAGNOSIS — F320 Major depressive disorder, single episode, mild: Secondary | ICD-10-CM

## 2022-04-24 DIAGNOSIS — I10 Essential (primary) hypertension: Secondary | ICD-10-CM

## 2022-04-24 NOTE — Patient Instructions (Addendum)
Please call the care guide team at [phone] if you need to cancel or reschedule your appointment.   If you are experiencing a Mental Health or [place] or need someone to talk to, please call the Suicide and Crisis Lifeline: 988 call the [place] National Suicide Prevention Lifeline: 219-666-1700 or TTY: 303-221-2127 TTY 573-707-7025) to talk to a trained counselor call 1-800-273-TALK (toll free, 24 hour hotline) go to [facility] [place] [phone])   Following is a copy of the CCM Program Consent:  CCM service includes personalized support from designated clinical staff supervised by the physician, including individualized plan of care and coordination with other care providers 24/7 contact phone numbers for assistance for urgent and routine care needs. Service will only be billed when office clinical staff spend 20 minutes or more in a month to coordinate care. Only one practitioner may furnish and bill the service in a calendar month. The patient may stop CCM services at amy time (effective at the end of the month) by phone call to the office staff. The patient will be responsible for cost sharing (co-pay) or up to 20% of the service fee (after annual deductible is met)  Following is a copy of your full provider care plan:   Goals Addressed             This Visit's Progress    CCM Expected Outcome:  Monitor, Self-Manage and Reduce Symptoms of Diabetes       Current Barriers:  Knowledge Deficits related to the need to check blood sugars on a regular basis, preventive care needs, and importance of following recommendations by the provider for effective management of DM and other chronic conditions Care Coordination needs related to resources the patient needs for senior housing so she can have a safe place to live without fear of being asked to move out in a patient with DM Chronic Disease Management support and  education needs related to effective management of DM Lacks caregiver support.  Film/video editor.   Planned Interventions: Provided education to patient about basic DM disease process; Reviewed medications with patient and discussed importance of medication adherence. 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 Denies any swelling;        Reviewed prescribed diet with patient heart healthy/ADA diet ; Counseled on importance of regular laboratory monitoring as prescribed;        Discussed plans with patient for ongoing care management follow up and provided patient with direct contact information for care management team;      Provided patient with written educational materials related to hypo and hyperglycemia and importance of correct treatment;       Reviewed scheduled/upcoming provider appointments including: 05-22-2022 ;         Referral made to social work team for assistance with resources needed in the community for senior housing. In basket message sent to the scheduler for SW support scheduling;      Review of patient status, including review of consultants reports, relevant laboratory and other test results, and medications completed;       Advised patient to discuss changes in her DM, Questions, and concerns with provider;      Screening for signs and symptoms of depression related to chronic disease state;        Assessed social determinant of health barriers;  Symptom Management: Take medications as prescribed   Attend all scheduled provider appointments Call provider office for new concerns or questions  call the Suicide and Crisis Lifeline: 988 call the [place] National Suicide Prevention Lifeline: (332) 215-7894 or TTY: 5157306693 TTY [phone]) to talk to a trained counselor call 1-800-273-TALK (toll free, 24 hour hotline) if experiencing a Mental Health or Behavioral Health  Crisis  schedule appointment with eye doctor check feet daily for cuts, sores or redness trim toenails straight across wash and dry feet carefully every day wear comfortable, cotton socks wear comfortable, well-fitting shoes  Follow Up Plan: Telephone follow up appointment with care management team member scheduled for: 05-14-2022 at 0900 am       CCM Expected Outcome:  Monitor, Self-Manage, and Reduce Symptoms of Hypertension       Current Barriers:  Knowledge Deficits related to the benefits of taking blood pressures on a regular basis to monitor changes in blood pressures and decrease risk of heart attack and stroke Care Coordination needs related to educational needs for a patient with HTN and other chronic conditions and resources in the community to help patient with housing needs  in a patient with HTN Chronic Disease Management support and education needs related to effective management of HTN Lacks caregiver support.  Needs stable housing, currently lives with her cousin but needs to find a place of her own. Asking for help with resources  BP Readings from Last 3 Encounters:  04/17/22 (!) 150/80  10/30/20 130/64  09/17/20 124/68     Planned Interventions: Evaluation of current treatment plan related to hypertension self management and patient's adherence to plan as established by provider. The patient has been lost to follow up until recently. Due to being homeless she has not had regular follow up with providers. Education provided to the patient. She is currently living with a cousin but she is concerned that she will be asked to leave eventually. She states since her husband died in Jul 23, 2011 and she had a stroke she has been "bouncing around like a basket ball".  She says that she is safe at this time and very thankful for her cousin but she wants to have a place of her own. Expressed the importance of self care and making sure she gets the things that she needs. She is thankful for  the support of the CCM team;   Provided education to patient re: stroke prevention, s/s of heart attack and stroke; Reviewed prescribed diet heart healthy/ADA diet- the patient states she is mindful of her dietary restrictions and she monitors her sodium and sugar intake Reviewed medications with patient and discussed importance of compliance. The patient states that she does have her medications and she is taking her medications as directed;  Discussed plans with patient for ongoing care management follow up and provided patient with direct contact information for care management team; Advised patient, providing education and rationale, to monitor blood pressure daily and record, calling PCP for findings outside established parameters;  Reviewed scheduled/upcoming provider appointments including: 05-22-2022 with the pcp Advised patient to discuss changes in her blood pressures or questions/Concerns about her heart health with provider; Provided education on prescribed diet heart healthy/ADA diet ;  Discussed complications of poorly controlled blood pressure such as heart disease, stroke, circulatory complications, vision complications, kidney impairment, sexual dysfunction;  Screening for signs and symptoms of depression related to chronic disease state;  Assessed social determinant of health barriers;   Symptom Management: Take medications as prescribed  Attend all scheduled provider appointments Call provider office for new concerns or questions  call the Suicide and Crisis Lifeline: 988 call the [place] National Suicide Prevention Lifeline: (534)377-3383 or TTY: 848-318-6392 TTY 848-039-1309) to talk to a trained counselor call 1-800-273-TALK (toll free, 24 hour hotline) if experiencing a Mental Health or Behavioral Health Crisis  check blood pressure weekly learn about high blood pressure call doctor for signs and symptoms of high blood pressure develop an action plan for high blood  pressure keep all doctor appointments take medications for blood pressure exactly as prescribed report new symptoms to your doctor  Follow Up Plan: Telephone follow up appointment with care management team member scheduled for: 05-14-2022 at 0900 am          The patient verbalized understanding of instructions, educational materials, and care plan provided today and agreed to receive a mailed copy of patient instructions, educational materials, and care plan.   Telephone follow up appointment with care management team member scheduled for: 05-14-2022 at 0900 am  DASH Eating Plan DASH stands for Dietary Approaches to Stop Hypertension. The DASH eating plan is a healthy eating plan that has been shown to: Reduce high blood pressure (hypertension). Reduce your risk for type 2 diabetes, heart disease, and stroke. Help with weight loss. What are tips for following this plan? Reading food labels Check food labels for the amount of salt (sodium) per serving. Choose foods with less than 5 percent of the Daily Value of sodium. Generally, foods with less than 300 milligrams (mg) of sodium per serving fit into this eating plan. To find whole grains, look for the word "whole" as the first word in the ingredient list. Shopping Buy products labeled as "low-sodium" or "no salt added." Buy fresh foods. Avoid canned foods and pre-made or frozen meals. Cooking Avoid adding salt when cooking. Use salt-free seasonings or herbs instead of table salt or sea salt. Check with your health care provider or pharmacist before using salt substitutes. Do not fry foods. Cook foods using healthy methods such as baking, boiling, grilling, roasting, and broiling instead. Cook with heart-healthy oils, such as olive, canola, avocado, soybean, or sunflower oil. Meal planning  Eat a balanced diet that includes: 4 or more servings of fruits and 4 or more servings of vegetables each day. Try to fill one-half of your plate  with fruits and vegetables. 6-8 servings of whole grains each day. Less than 6 oz (170 g) of lean meat, poultry, or fish each day. A 3-oz (85-g) serving of meat is about the same size as a deck of cards. One egg equals 1 oz (28 g). 2-3 servings of low-fat dairy each day. One serving is 1 cup (237 mL). 1 serving of nuts, seeds, or beans 5 times each week. 2-3 servings of heart-healthy fats. Healthy fats called omega-3 fatty acids are found in foods such as walnuts, flaxseeds, fortified milks, and eggs. These fats are also found in cold-water fish, such as sardines, salmon, and mackerel. Limit how much you eat of: Canned or prepackaged foods. Food that is high in trans fat, such as some fried foods. Food that is high in saturated fat, such as fatty meat. Desserts and other sweets, sugary drinks, and other foods with added sugar. Full-fat dairy products. Do not salt foods before eating. Do not eat more than 4 egg yolks a week. Try to eat at least 2 vegetarian meals a week. Eat more home-cooked food and less restaurant, buffet, and fast food. Lifestyle  When eating at a restaurant, ask that your food be prepared with less salt or no salt, if possible. If you drink alcohol: Limit how much you use to: 0-1 drink a day for women who are not pregnant. 0-2 drinks a day for men. Be aware of how much alcohol is in your drink. In the [place] one drink equals one 12 oz bottle of beer (355 mL), one 5 oz glass of wine (148 mL), or one 1 oz glass of hard liquor (44 mL). General information Avoid eating more than 2,300 mg of salt a day. If you have hypertension, you may need to reduce your sodium intake to 1,500 mg a day. Work with your health care provider to maintain a healthy body weight or to lose weight. Ask what an ideal weight is for you. Get at least 30 minutes of exercise that causes your heart to beat faster (aerobic exercise) most days of the week. Activities may include walking, swimming, or  biking. Work with your health care provider or dietitian to adjust your eating plan to your individual calorie needs. What foods should I eat? Fruits All fresh, dried, or frozen fruit. Canned fruit in natural juice (without added sugar). Vegetables Fresh or frozen vegetables (raw, steamed, roasted, or grilled). Low-sodium or reduced-sodium tomato and vegetable juice. Low-sodium or reduced-sodium tomato sauce and tomato paste. Low-sodium or reduced-sodium canned vegetables. Grains Whole-grain or whole-wheat bread. Whole-grain or whole-wheat pasta. Brown rice. [place]. Bulgur. Whole-grain and low-sodium cereals. Pita bread. Low-fat, low-sodium crackers. Whole-wheat flour tortillas. Meats and other proteins Skinless chicken or [place]. Ground chicken or [place]. Pork with fat trimmed off. Fish and seafood. Egg whites. Dried beans, peas, or lentils. Unsalted nuts, nut butters, and seeds. Unsalted canned beans. Lean cuts of beef with fat trimmed off. Low-sodium, lean precooked or cured meat, such as sausages or meat loaves. Dairy Low-fat (1%) or fat-free (skim) milk. Reduced-fat, low-fat, or fat-free cheeses. Nonfat, low-sodium ricotta or cottage cheese. Low-fat or nonfat yogurt. Low-fat, low-sodium cheese. Fats and oils Soft margarine without trans fats. Vegetable oil. Reduced-fat, low-fat, or light mayonnaise and salad dressings (reduced-sodium). Canola, safflower, olive, avocado, soybean, and sunflower oils. Avocado. Seasonings and condiments Herbs. Spices. Seasoning mixes without salt. Other foods Unsalted popcorn and pretzels. Fat-free sweets. The items listed above may not be a complete list of foods and beverages you can eat. Contact a dietitian for more information. What foods should I avoid? Fruits Canned fruit in a light or heavy syrup. Fried fruit. Fruit in cream or butter sauce. Vegetables Creamed or fried vegetables. Vegetables in a cheese sauce. Regular canned vegetables (not  low-sodium or reduced-sodium). Regular canned tomato sauce and paste (not low-sodium or reduced-sodium). Regular tomato and vegetable juice (not low-sodium or reduced-sodium). Angie Fava. Olives. Grains Baked goods made with fat, such as croissants, muffins, or some breads. Dry pasta or rice meal packs. Meats and other proteins Fatty cuts of meat. Ribs. Fried meat. Berniece Salines. Bologna, salami, and other precooked or cured meats, such as sausages or meat loaves. Fat from the back of a pig (fatback). Bratwurst. Salted nuts and seeds. Canned beans with added salt. Canned or smoked fish. Whole eggs or egg yolks. Chicken or [place] with skin. Dairy Whole or 2% milk, cream, and half-and-half. Whole or full-fat cream cheese. Whole-fat or sweetened yogurt. Full-fat cheese. Nondairy creamers. Whipped toppings. Processed cheese and cheese spreads. Fats and oils Butter. Stick margarine. Lard. Shortening. Ghee. Bacon fat. Tropical oils, such as coconut, palm kernel, or palm oil. Seasonings  and condiments Onion salt, garlic salt, seasoned salt, table salt, and sea salt. Worcestershire sauce. Tartar sauce. Barbecue sauce. Teriyaki sauce. Soy sauce, including reduced-sodium. Steak sauce. Canned and packaged gravies. Fish sauce. Oyster sauce. Cocktail sauce. Store-bought horseradish. Ketchup. Mustard. Meat flavorings and tenderizers. Bouillon cubes. Hot sauces. Pre-made or packaged marinades. Pre-made or packaged taco seasonings. Relishes. Regular salad dressings. Other foods Salted popcorn and pretzels. The items listed above may not be a complete list of foods and beverages you should avoid. Contact a dietitian for more information. Where to find more information National Heart, Lung, and Blood Institute: PopSteam.is American Heart Association: www.heart.org Academy of Nutrition and Dietetics: www.eatright.org National Kidney Foundation: www.kidney.org Summary The DASH eating plan is a healthy eating plan that  has been shown to reduce high blood pressure (hypertension). It may also reduce your risk for type 2 diabetes, heart disease, and stroke. When on the DASH eating plan, aim to eat more fresh fruits and vegetables, whole grains, lean proteins, low-fat dairy, and heart-healthy fats. With the DASH eating plan, you should limit salt (sodium) intake to 2,300 mg a day. If you have hypertension, you may need to reduce your sodium intake to 1,500 mg a day. Work with your health care provider or dietitian to adjust your eating plan to your individual calorie needs. This information is not intended to replace advice given to you by your health care provider. Make sure you discuss any questions you have with your health care provider. Document Revised: 03/04/2019 Document Reviewed: 03/04/2019 Elsevier Patient Education  2023 [employer].

## 2022-04-24 NOTE — Chronic Care Management (AMB) (Signed)
Chronic Care Management   CCM RN Visit Note  04/24/2022 Name: Lisa [name] MRN: [id number] DOB: 09-02-1950  Subjective: Lisa [name] is a 72 y.o. year old female who is a primary care patient of [name], [name], NP. The patient was referred to the Chronic Care Management team for assistance with care management needs subsequent to provider initiation of CCM services and plan of care.    Today's Visit:  Engaged with patient by telephone for initial visit.     SDOH Interventions Today    Flowsheet Row Most Recent Value  SDOH Interventions   Food Insecurity Interventions Intervention Not Indicated  Housing Interventions Other (Comment)  [the patient currently lives with her cousin, needs stable housing]  Transportation Interventions Intervention Not Indicated  Utilities Interventions Intervention Not Indicated  Alcohol Usage Interventions Intervention Not Indicated (Score <7)  Financial Strain Interventions Other (Comment)  [can get medications without difficulty, does need stable housing, SW referral pending]  Physical Activity Interventions Other (Comments), Intervention Not Indicated  [no structured activity]  Stress Interventions Intervention Not Indicated  Social Connections Interventions Intervention Not Indicated, Other (Comment)  [has a good support system]         Goals Addressed             This Visit's Progress    CCM Expected Outcome:  Monitor, Self-Manage and Reduce Symptoms of Diabetes       Current Barriers:  Knowledge Deficits related to the need to check blood sugars on a regular basis, preventive care needs, and importance of following recommendations by the provider for effective management of DM and other chronic conditions Care Coordination needs related to resources the patient needs for senior housing so she can have a safe place to live without fear of being asked to move out in a patient with DM Chronic Disease Management support and education  needs related to effective management of DM Lacks caregiver support.  Film/video editor.   Planned Interventions: Provided education to patient about basic DM disease process; Reviewed medications with patient and discussed importance of medication adherence. 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 Denies any swelling;        Reviewed prescribed diet with patient heart healthy/ADA diet ; Counseled on importance of regular laboratory monitoring as prescribed;        Discussed plans with patient for ongoing care management follow up and provided patient with direct contact information for care management team;      Provided patient with written educational materials related to hypo and hyperglycemia and importance of correct treatment;       Reviewed scheduled/upcoming provider appointments including: 05-22-2022 ;         Referral made to social work team for assistance with resources needed in the community for senior housing. In basket message sent to the scheduler for SW support scheduling;      Review of patient status, including review of consultants reports, relevant laboratory and other test results, and medications completed;       Advised patient to discuss changes in her DM, Questions, and concerns with provider;      Screening for signs and symptoms of depression related to chronic disease state;        Assessed social determinant of health barriers;         Symptom Management: Take medications as prescribed   Attend all scheduled provider  appointments Call provider office for new concerns or questions  call the Suicide and Crisis Lifeline: 988 call the [place] National Suicide Prevention Lifeline: (754) 437-0114 or TTY: (858)341-6107 TTY 516-186-8355) to talk to a trained counselor call 1-800-273-TALK (toll free, 24 hour hotline) if experiencing a Mental Health or Behavioral Health Crisis   schedule appointment with eye doctor check feet daily for cuts, sores or redness trim toenails straight across wash and dry feet carefully every day wear comfortable, cotton socks wear comfortable, well-fitting shoes  Follow Up Plan: Telephone follow up appointment with care management team member scheduled for: 05-14-2022 at 0900 am       CCM Expected Outcome:  Monitor, Self-Manage, and Reduce Symptoms of Hypertension       Current Barriers:  Knowledge Deficits related to the benefits of taking blood pressures on a regular basis to monitor changes in blood pressures and decrease risk of heart attack and stroke Care Coordination needs related to educational needs for a patient with HTN and other chronic conditions and resources in the community to help patient with housing needs  in a patient with HTN Chronic Disease Management support and education needs related to effective management of HTN Lacks caregiver support.  Needs stable housing, currently lives with her cousin but needs to find a place of her own. Asking for help with resources  BP Readings from Last 3 Encounters:  04/17/22 (!) 150/80  10/30/20 130/64  09/17/20 124/68     Planned Interventions: Evaluation of current treatment plan related to hypertension self management and patient's adherence to plan as established by provider. The patient has been lost to follow up until recently. Due to being homeless she has not had regular follow up with providers. Education provided to the patient. She is currently living with a cousin but she is concerned that she will be asked to leave eventually. She states since her husband died in 2011-08-03 and she had a stroke she has been "bouncing around like a basket ball".  She says that she is safe at this time and very thankful for her cousin but she wants to have a place of her own. Expressed the importance of self care and making sure she gets the things that she needs. She is thankful for the  support of the CCM team;   Provided education to patient re: stroke prevention, s/s of heart attack and stroke; Reviewed prescribed diet heart healthy/ADA diet- the patient states she is mindful of her dietary restrictions and she monitors her sodium and sugar intake Reviewed medications with patient and discussed importance of compliance. The patient states that she does have her medications and she is taking her medications as directed;  Discussed plans with patient for ongoing care management follow up and provided patient with direct contact information for care management team; Advised patient, providing education and rationale, to monitor blood pressure daily and record, calling PCP for findings outside established parameters;  Reviewed scheduled/upcoming provider appointments including: 05-22-2022 with the pcp Advised patient to discuss changes in her blood pressures or questions/Concerns about her heart health with provider; Provided education on prescribed diet heart healthy/ADA diet ;  Discussed complications of poorly controlled blood pressure such as heart disease, stroke, circulatory complications, vision complications, kidney impairment, sexual dysfunction;  Screening for signs and symptoms of depression related to chronic disease state;  Assessed social determinant of health barriers;   Symptom Management: Take medications as prescribed   Attend all scheduled provider appointments Call provider office for new concerns  or questions  call the Suicide and Crisis Lifeline: 988 call the [place] National Suicide Prevention Lifeline: 873-473-4638 or TTY: 404 161 6480 TTY 715-656-7711) to talk to a trained counselor call 1-800-273-TALK (toll free, 24 hour hotline) if experiencing a Mental Health or [place]  check blood pressure weekly learn about high blood pressure call doctor for signs and symptoms of high blood pressure develop an action plan for high blood  pressure keep all doctor appointments take medications for blood pressure exactly as prescribed report new symptoms to your doctor  Follow Up Plan: Telephone follow up appointment with care management team member scheduled for: 05-14-2022 at 0900 am          Plan:Telephone follow up appointment with care management team member scheduled for:  05-14-2022 at 09 am  Noreene Larsson RN, MSN, CCM RN Care Manager  Chronic Care Management Direct Number: [phone]

## 2022-04-24 NOTE — Plan of Care (Signed)
Chronic Care Management Provider Comprehensive Care Plan    04/24/2022 Name: Lisa [name] MRN: [id number] DOB: Jun 02, 1950  Referral to Chronic Care Management (CCM) services was placed by Provider:  Rodman Pickle, NP on Date: 04-17-2022.  Chronic Condition 1: HTN Provider Assessment and Plan  Chronic, slightly elevated today.  She was taking amlodipine 10 mg at 1 point, however she started getting dizzy and was decreased down to 5.  Will have her continue her current regimen for now, including amlodipine 5 mg daily, carvedilol 12.5 mg twice daily, hydralazine 25 mg twice daily, and Entresto 49-51 mg twice daily.  Check CMP, CBC today.  Follow-up in 3 months.         Relevant Medications    amLODipine (NORVASC) 5 MG tablet    sacubitril-valsartan (ENTRESTO) 49-51 MG    Other Relevant Orders    CBC with Differential/Platelet    Comprehensive metabolic panel    Lipid panel    AMB Referral to Chronic Care Management Services     Expected Outcome/Goals Addressed This Visit (Provider CCM goals/Provider Assessment and plan   CCM (HYPERTENSION)  EXPECTED OUTCOME:  MONITOR,SELF- MANAGE AND REDUCE SYMPTOMS OF HYPERTENSION   Symptom Management Condition 1: Take all medications as prescribed Attend all scheduled provider appointments Call provider office for new concerns or questions  call the Suicide and Crisis Lifeline: 988 call the [place] National Suicide Prevention Lifeline: 854-427-5296 or TTY: 212-317-5186 TTY 480-031-6714) to talk to a trained counselor call 1-800-273-TALK (toll free, 24 hour hotline) go to [facility] [address] [place] [phone]) if experiencing a Mental Health or Behavioral Health Crisis  check blood pressure weekly learn about high blood pressure keep all doctor appointments take medications for blood pressure exactly as prescribed report new symptoms to your doctor eat more whole grains, fruits and  vegetables, lean meats and healthy fats  Chronic Condition 2: DM Provider Assessment and Plan   Her blood sugars had been controlled, however has not been to a healthcare provider in over a year.  Will check A1c, CMP, CBC, urine microalbumin today.  Will have her continue metformin 250 mg twice daily.  Recommend that she schedule an eye exam when able.  Foot exam done today does show neuropathy.  We are having her start gabapentin 100 mg at bedtime.  Will also check vitamin B12 levels today.  Follow-up in 3 months.  Will also place a referral to CCM for RN for education.         Relevant Medications    metFORMIN (GLUCOPHAGE) 500 MG tablet    Blood Glucose Monitoring Suppl (ONE TOUCH ULTRA 2) w/Device KIT    glucose blood (ONETOUCH ULTRA) test strip    Other Relevant Orders    CBC with Differential/Platelet    Comprehensive metabolic panel    Lipid panel    Hemoglobin A1c    Microalbumin / creatinine urine ratio    AMB Referral to Chronic Care Management Services She states that since her husband passed away in 2011/08/05, she has been living with family or she was homeless on the street.  She states that for the last year she was homeless until her cousin took her in a few months ago.  She is now able to eat 3 meals again daily.  She is still concerned about housing and is worried if her cousin will kick her out at some point like all of her other family did.  Will place a referral to  CCM for social work.         Relevant Orders    AMB Referral to Chronic Care Management Services       Expected Outcome/Goals Addressed This Visit (Provider CCM goals/Provider Assessment and plan  CCM (Diabetes)  EXPECTED OUTCOME:  MONITOR,SELF- MANAGE AND REDUCE SYMPTOMS OF Diabetes    Symptom Management Condition 2: Take all medications as prescribed Attend all scheduled provider appointments Call provider office for new concerns or questions  call the Suicide and Crisis Lifeline: 988 call the [place]  National Suicide Prevention Lifeline: (450)049-8140 or TTY: 519-865-2604 TTY 956-048-8993) to talk to a trained counselor call 1-800-273-TALK (toll free, 24 hour hotline) go to [facility] [address], [place] [phone]) if experiencing a Mental Health or Behavioral Health Crisis  schedule appointment with eye doctor check feet daily for cuts, sores or redness trim toenails straight across manage portion size wash and dry feet carefully every day wear comfortable, cotton socks wear comfortable, well-fitting shoes  Problem List Patient Active Problem List   Diagnosis Date Noted   Chronic combined systolic and diastolic heart failure (HCC) 04/17/2022   Depression, major, single episode, mild (HCC) 04/17/2022   Controlled type 2 diabetes with neuropathy (HCC) 04/17/2022   Housing instability, currently housed, at risk for homelessness 04/17/2022   Fibromyalgia 08/29/2020   Hyperlipidemia associated with type 2 diabetes mellitus (HCC) 06/20/2020   Osteoarthritis 06/20/2020   Normocytic normochromic anemia 11/18/2018   Insomnia 04/25/2013   Allergic rhinitis 08/10/2012   Left low back pain 06/26/2012   Numbness and tingling in both hands 06/26/2012   S4 (fourth heart sound) 05/02/2012   History of CVA (cerebrovascular accident) 11/20/2011   Essential hypertension 11/20/2011   Former light cigarette smoker (1-9 per day) 11/20/2011    Medication Management  Current Outpatient Medications:    Alcohol Swabs (DROPSAFE ALCOHOL PREP) 70 % PADS, Apply topically., Disp: , Rfl:    amLODipine (NORVASC) 5 MG tablet, Take 5 mg by mouth daily., Disp: , Rfl:    aspirin 81 MG chewable tablet, Chew 81 mg by mouth daily., Disp: , Rfl:    atorvastatin (LIPITOR) 40 MG tablet, Take 40 mg by mouth daily., Disp: , Rfl:    Blood Glucose Monitoring Suppl (ONE TOUCH ULTRA 2) w/Device KIT, 1 each by Does not apply route daily at 6 (six) AM., Disp: 1 kit, Rfl:  0   carvedilol (COREG) 12.5 MG tablet, Take 12.5 mg by mouth 2 (two) times daily with a meal., Disp: , Rfl:    citalopram (CELEXA) 20 MG tablet, Take 1 tablet (20 mg total) by mouth daily., Disp: 90 tablet, Rfl: 1   clopidogrel (PLAVIX) 75 MG tablet, Take 75 mg by mouth daily., Disp: , Rfl:    diclofenac Sodium (VOLTAREN) 1 % GEL, APPLY 2 GRAMS TOPICALLY FOUR TIMES DAILY, Disp: 400 g, Rfl: 3   gabapentin (NEURONTIN) 100 MG capsule, Take 1 capsule (100 mg total) by mouth at bedtime., Disp: 90 capsule, Rfl: 0   glucose blood (ONETOUCH ULTRA) test strip, Use as instructed, Disp: 100 each, Rfl: 12   hydrALAZINE (APRESOLINE) 25 MG tablet, Take 25 mg by mouth 2 (two) times daily., Disp: , Rfl:    melatonin 3 MG TABS tablet, TAKE 1 TABLET AT BEDTIME, Disp: 90 tablet, Rfl: 3   metFORMIN (GLUCOPHAGE) 500 MG tablet, Take 250 mg by mouth 2 (two) times daily with a meal., Disp: , Rfl:    sacubitril-valsartan (ENTRESTO) 49-51 MG, Take 1  tablet by mouth 2 (two) times daily., Disp: , Rfl:   Cognitive Assessment Identity Confirmed: : Name; DOB Cognitive Status: Normal   Functional Assessment Hearing Difficulty or Deaf: no Wear Glasses or Blind: yes Vision Management: wears glasses Concentrating, Remembering or Making Decisions Difficulty (CP): no Difficulty Communicating: no Difficulty Eating/Swallowing: no Walking or Climbing Stairs Difficulty: no Dressing/Bathing Difficulty: no Doing Errands Independently Difficulty (such as shopping) (CP): no Change in Functional Status Since Onset of Current Illness/Injury: no   Caregiver Assessment  Primary Source of Support/Comfort: child(ren); extended family Name of Support/Comfort Primary Source: cousin whom she lives with and her daughter Brunetta Genera [place]kin People in Home: other relative(s) Name(s) of People in Home: Her cousin, female- name not given Family Caregiver if Needed: child(ren), adult Family Caregiver Names: Brunetta Genera Burkes Primary  Roles/Responsibilities: retired Concerns About Impact on Relationships: The patient is concerned about being asked to leave from her cousins house and would like to find a place to live on her own. SW consult pending.   Planned Interventions  Provided education to patient about basic DM disease process; Reviewed medications with patient and discussed importance of medication adherence. 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 Denies any swelling;        Reviewed prescribed diet with patient heart healthy/ADA diet ; Counseled on importance of regular laboratory monitoring as prescribed;        Discussed plans with patient for ongoing care management follow up and provided patient with direct contact information for care management team;      Provided patient with written educational materials related to hypo and hyperglycemia and importance of correct treatment;       Reviewed scheduled/upcoming provider appointments including: 05-22-2022 ;         Referral made to social work team for assistance with resources needed in the community for senior housing. In basket message sent to the scheduler for SW support scheduling;      Review of patient status, including review of consultants reports, relevant laboratory and other test results, and medications completed;       Advised patient to discuss changes in her DM, Questions, and concerns with provider;      Screening for signs and symptoms of depression related to chronic disease state;        Assessed social determinant of health barriers;        Evaluation of current treatment plan related to hypertension self management and patient's adherence to plan as established by provider. The patient has been lost to follow up until recently. Due to being homeless she has not had regular follow up with providers. Education provided to the patient. She is currently living with  a cousin but she is concerned that she will be asked to leave eventually. She states since her husband died in 2011/08/06 and she had a stroke she has been "bouncing around like a basket ball".  She says that she is safe at this time and very thankful for her cousin but she wants to have a place of her own. Expressed the importance of self care and making sure she gets the things that she needs. She is thankful for the support of the CCM team;   Provided education to patient re: stroke prevention, s/s of heart attack and stroke; Reviewed prescribed diet heart healthy/ADA diet- the patient states she is mindful of her dietary restrictions  and she monitors her sodium and sugar intake Reviewed medications with patient and discussed importance of compliance. The patient states that she does have her medications and she is taking her medications as directed;  Discussed plans with patient for ongoing care management follow up and provided patient with direct contact information for care management team; Advised patient, providing education and rationale, to monitor blood pressure daily and record, calling PCP for findings outside established parameters;  Reviewed scheduled/upcoming provider appointments including: 05-22-2022 with the pcp Advised patient to discuss changes in her blood pressures or questions/Concerns about her heart health with provider; Provided education on prescribed diet heart healthy/ADA diet ;  Discussed complications of poorly controlled blood pressure such as heart disease, stroke, circulatory complications, vision complications, kidney impairment, sexual dysfunction;  Screening for signs and symptoms of depression related to chronic disease state;  Assessed social determinant of health barriers;   Interaction and coordination with outside resources, practitioners, and providers See CCM Referral  Care Plan: Printed and mailed to patient

## 2022-04-25 ENCOUNTER — Telehealth: Payer: Self-pay | Admitting: *Deleted

## 2022-04-25 NOTE — Progress Notes (Signed)
  Care Coordination   Note   04/25/2022 Name: Lisa [name] MRN: [id number] DOB: [birth year]  Lisa [name] is a 72 y.o. year old female who sees [name], [name], NP for primary care. I reached out to [facility] by phone today to offer care coordination services.  [name] was given information about Care Coordination services today including:   The Care Coordination services include support from the care team which includes your Nurse Coordinator, Clinical Social Worker, or Pharmacist.  The Care Coordination team is here to help remove barriers to the health concerns and goals most important to you. Care Coordination services are voluntary, and the patient may decline or stop services at any time by request to their care team member.   Care Coordination Consent Status: Patient agreed to services and verbal consent obtained.   Follow up plan:  Telephone appointment with care coordination team member scheduled for:  04/29/2022  Encounter Outcome:  Pt. Scheduled  Julian Hy, [place] Direct Dial: [phone]

## 2022-04-29 ENCOUNTER — Ambulatory Visit: Payer: Self-pay

## 2022-04-29 NOTE — Patient Outreach (Signed)
  Care Coordination   Follow Up Visit Note   04/29/2022 Name: Jazzmine Kleiman MRN: [id number] DOB: 02-Nov-1950  Rexanne Inocencio Swoveland is a 72 y.o. year old female who sees [name], [name], NP for primary care. I spoke with  Hulda Marin Asbury by phone today.  What matters to the patients health and wellness today?  Housing resources    Goals Addressed             This Visit's Progress    COMPLETED: Care Coordination Activities       Care Coordination Interventions: [facility] screening performed - no concerns identified at this time Discussed the patient is currently living with her cousin but she prefers to live on her own. Patients cousin has not told the patient she can no longer stay there Patient reports she has seen apartments that she is interested in Reviewed process for applying for low income apartments by completing applications for the desired complex Determined the patient has yet to begin application process; patient to have her cousin assist her with transportation to obtain and submit application to desired complex's Discussed most low income apartments have at least a 1 year long wait list so it is important for patient to do this as soon as possible Advised the patient [employer] is also accepting applications to their housing programs for persons over the age of 41 Provided the patient with the contact number to [employer] to schedule an appointment with a caseworker regarding housing programs Discussed opportunity for patient to be placed into assisted living or independent living - patient declines Assessed for further SW support needs - patient declines ongoing support and thanks SW for resources provided Collaboration with RN Care Manager Noreene Larsson to advise of interventions and plan for SW to sign off as resources have been provided         SDOH assessments and interventions completed:  Yes  SDOH Interventions Today     Flowsheet Row Most Recent Value  SDOH Interventions   Food Insecurity Interventions Intervention Not Indicated  Housing Interventions Intervention Not Indicated  Transportation Interventions Intervention Not Indicated        Care Coordination Interventions:  Yes, provided   Follow up plan: No further intervention required.   Encounter Outcome:  Pt. Visit Completed   Daneen Schick, BSW, CDP Social Worker, Certified Dementia Practitioner [facility] [facility]  Care Coordination [phone]

## 2022-04-29 NOTE — Patient Instructions (Signed)
Visit Information  Thank you for taking time to visit with me today. Please don't hesitate to contact me if I can be of assistance to you.   Following are the goals we discussed today:   Goals Addressed             This Visit's Progress    COMPLETED: Care Coordination Activities       Care Coordination Interventions: SDoH screening performed - no concerns identified at this time Discussed the patient is currently living with her cousin but she prefers to live on her own. Patients cousin has not told the patient she can no longer stay there Patient reports she has seen apartments that she is interested in Reviewed process for applying for low income apartments by completing applications for the desired complex Determined the patient has yet to begin application process; patient to have her cousin assist her with transportation to obtain and submit application to desired complex's Discussed most low income apartments have at least a 1 year long wait list so it is important for patient to do this as soon as possible Advised the patient [employer] is also accepting applications to their housing programs for persons over the age of 37 Provided the patient with the contact number to [employer] to schedule an appointment with a caseworker regarding housing programs Discussed opportunity for patient to be placed into assisted living or independent living - patient declines Assessed for further SW support needs - patient declines ongoing support and thanks SW for resources provided Collaboration with RN Care Manager Noreene Larsson to advise of interventions and plan for SW to sign off as resources have been provided         If you are experiencing a Mental Health or [place] or need someone to talk to, please call 911  The patient verbalized understanding of instructions, educational materials, and care plan provided today and DECLINED offer to  receive copy of patient instructions, educational materials, and care plan.   No further follow up required: Please contact your primary care provider as needed.  Daneen Schick, BSW, CDP Social Worker, Certified Dementia Practitioner [facility]  Care Coordination [phone]

## 2022-05-04 ENCOUNTER — Encounter (HOSPITAL_COMMUNITY)
Admission: EM | Disposition: A | Discharge status: Home / Self Care | Payer: Self-pay | Source: Home / Self Care | Attending: Emergency Medicine

## 2022-05-04 ENCOUNTER — Other Ambulatory Visit: Payer: Self-pay

## 2022-05-04 ENCOUNTER — Encounter (HOSPITAL_COMMUNITY): Payer: Self-pay

## 2022-05-04 ENCOUNTER — Observation Stay (HOSPITAL_COMMUNITY): Payer: Medicare PPO | Admitting: Anesthesiology

## 2022-05-04 ENCOUNTER — Observation Stay (HOSPITAL_COMMUNITY): Payer: Medicare PPO

## 2022-05-04 ENCOUNTER — Observation Stay (HOSPITAL_COMMUNITY)
Admission: EM | Admit: 2022-05-04 | Discharge: 2022-05-06 | Disposition: A | Discharge status: Home / Self Care | Payer: Medicare PPO | Attending: Internal Medicine | Admitting: Internal Medicine

## 2022-05-04 ENCOUNTER — Observation Stay (HOSPITAL_BASED_OUTPATIENT_CLINIC_OR_DEPARTMENT_OTHER): Payer: Medicare PPO | Admitting: Anesthesiology

## 2022-05-04 DIAGNOSIS — K250 Acute gastric ulcer with hemorrhage: Secondary | ICD-10-CM

## 2022-05-04 DIAGNOSIS — I5042 Chronic combined systolic (congestive) and diastolic (congestive) heart failure: Secondary | ICD-10-CM | POA: Diagnosis not present

## 2022-05-04 DIAGNOSIS — K2970 Gastritis, unspecified, without bleeding: Secondary | ICD-10-CM | POA: Insufficient documentation

## 2022-05-04 DIAGNOSIS — E1140 Type 2 diabetes mellitus with diabetic neuropathy, unspecified: Secondary | ICD-10-CM | POA: Diagnosis present

## 2022-05-04 DIAGNOSIS — D649 Anemia, unspecified: Secondary | ICD-10-CM

## 2022-05-04 DIAGNOSIS — K920 Hematemesis: Secondary | ICD-10-CM

## 2022-05-04 DIAGNOSIS — R1110 Vomiting, unspecified: Secondary | ICD-10-CM | POA: Diagnosis present

## 2022-05-04 DIAGNOSIS — D62 Acute posthemorrhagic anemia: Secondary | ICD-10-CM | POA: Diagnosis present

## 2022-05-04 DIAGNOSIS — I5022 Chronic systolic (congestive) heart failure: Secondary | ICD-10-CM | POA: Diagnosis not present

## 2022-05-04 DIAGNOSIS — Z79899 Other long term (current) drug therapy: Secondary | ICD-10-CM | POA: Diagnosis not present

## 2022-05-04 DIAGNOSIS — K259 Gastric ulcer, unspecified as acute or chronic, without hemorrhage or perforation: Secondary | ICD-10-CM | POA: Diagnosis not present

## 2022-05-04 DIAGNOSIS — I509 Heart failure, unspecified: Secondary | ICD-10-CM | POA: Diagnosis not present

## 2022-05-04 DIAGNOSIS — I10 Essential (primary) hypertension: Secondary | ICD-10-CM | POA: Diagnosis not present

## 2022-05-04 DIAGNOSIS — Z87891 Personal history of nicotine dependence: Secondary | ICD-10-CM | POA: Insufficient documentation

## 2022-05-04 DIAGNOSIS — Z7982 Long term (current) use of aspirin: Secondary | ICD-10-CM | POA: Diagnosis not present

## 2022-05-04 DIAGNOSIS — I110 Hypertensive heart disease with heart failure: Secondary | ICD-10-CM

## 2022-05-04 DIAGNOSIS — Z1211 Encounter for screening for malignant neoplasm of colon: Secondary | ICD-10-CM

## 2022-05-04 DIAGNOSIS — I5032 Chronic diastolic (congestive) heart failure: Secondary | ICD-10-CM | POA: Diagnosis present

## 2022-05-04 DIAGNOSIS — Z8673 Personal history of transient ischemic attack (TIA), and cerebral infarction without residual deficits: Secondary | ICD-10-CM | POA: Diagnosis not present

## 2022-05-04 DIAGNOSIS — Z7902 Long term (current) use of antithrombotics/antiplatelets: Secondary | ICD-10-CM | POA: Insufficient documentation

## 2022-05-04 DIAGNOSIS — Z7984 Long term (current) use of oral hypoglycemic drugs: Secondary | ICD-10-CM | POA: Insufficient documentation

## 2022-05-04 DIAGNOSIS — E785 Hyperlipidemia, unspecified: Secondary | ICD-10-CM | POA: Diagnosis present

## 2022-05-04 DIAGNOSIS — K922 Gastrointestinal hemorrhage, unspecified: Principal | ICD-10-CM | POA: Diagnosis present

## 2022-05-04 DIAGNOSIS — E119 Type 2 diabetes mellitus without complications: Secondary | ICD-10-CM

## 2022-05-04 HISTORY — PX: ESOPHAGOGASTRODUODENOSCOPY (EGD) WITH PROPOFOL: SHX5813

## 2022-05-04 LAB — CBC WITH DIFFERENTIAL/PLATELET
Abs Immature Granulocytes: 0.03 10*3/uL (ref 0.00–0.07)
Basophils Absolute: 0 10*3/uL (ref 0.0–0.1)
Basophils Relative: 0 %
Eosinophils Absolute: 0.1 10*3/uL (ref 0.0–0.5)
Eosinophils Relative: 1 %
HCT: 30.4 % — ABNORMAL LOW (ref 36.0–46.0)
Hemoglobin: 9.7 g/dL — ABNORMAL LOW (ref 12.0–15.0)
Immature Granulocytes: 0 %
Lymphocytes Relative: 18 %
Lymphs Abs: 1.5 10*3/uL (ref 0.7–4.0)
MCH: 32.9 pg (ref 26.0–34.0)
MCHC: 31.9 g/dL (ref 30.0–36.0)
MCV: 103.1 fL — ABNORMAL HIGH (ref 80.0–100.0)
Monocytes Absolute: 0.6 10*3/uL (ref 0.1–1.0)
Monocytes Relative: 7 %
Neutro Abs: 6 10*3/uL (ref 1.7–7.7)
Neutrophils Relative %: 74 %
Platelets: 178 10*3/uL (ref 150–400)
RBC: 2.95 MIL/uL — ABNORMAL LOW (ref 3.87–5.11)
RDW: 12.7 % (ref 11.5–15.5)
WBC: 8.3 10*3/uL (ref 4.0–10.5)
nRBC: 0 % (ref 0.0–0.2)

## 2022-05-04 LAB — COMPREHENSIVE METABOLIC PANEL
ALT: 7 U/L (ref 0–44)
AST: 13 U/L — ABNORMAL LOW (ref 15–41)
Albumin: 3 g/dL — ABNORMAL LOW (ref 3.5–5.0)
Alkaline Phosphatase: 41 U/L (ref 38–126)
Anion gap: 9 (ref 5–15)
BUN: 22 mg/dL (ref 8–23)
CO2: 22 mmol/L (ref 22–32)
Calcium: 8.2 mg/dL — ABNORMAL LOW (ref 8.9–10.3)
Chloride: 107 mmol/L (ref 98–111)
Creatinine, Ser: 1.06 mg/dL — ABNORMAL HIGH (ref 0.44–1.00)
GFR, Estimated: 57 mL/min — ABNORMAL LOW (ref >60)
Glucose, Bld: 163 mg/dL — ABNORMAL HIGH (ref 70–99)
Potassium: 4.6 mmol/L (ref 3.5–5.1)
Sodium: 138 mmol/L (ref 135–145)
Total Bilirubin: 0.6 mg/dL (ref 0.3–1.2)
Total Protein: 5.7 g/dL — ABNORMAL LOW (ref 6.5–8.1)

## 2022-05-04 LAB — HEMOGLOBIN AND HEMATOCRIT, BLOOD
HCT: 26 % — ABNORMAL LOW (ref 36.0–46.0)
Hemoglobin: 8.8 g/dL — ABNORMAL LOW (ref 12.0–15.0)

## 2022-05-04 LAB — I-STAT CHEM 8, ED
BUN: 23 mg/dL (ref 8–23)
Calcium, Ion: 1.12 mmol/L — ABNORMAL LOW (ref 1.15–1.40)
Chloride: 106 mmol/L (ref 98–111)
Creatinine, Ser: 1.1 mg/dL — ABNORMAL HIGH (ref 0.44–1.00)
Glucose, Bld: 158 mg/dL — ABNORMAL HIGH (ref 70–99)
HCT: 28 % — ABNORMAL LOW (ref 36.0–46.0)
Hemoglobin: 9.5 g/dL — ABNORMAL LOW (ref 12.0–15.0)
Potassium: 4.6 mmol/L (ref 3.5–5.1)
Sodium: 140 mmol/L (ref 135–145)
TCO2: 25 mmol/L (ref 22–32)

## 2022-05-04 LAB — LIPASE, BLOOD: Lipase: 45 U/L (ref 11–51)

## 2022-05-04 LAB — GLUCOSE, CAPILLARY
Glucose-Capillary: 104 mg/dL — ABNORMAL HIGH (ref 70–99)
Glucose-Capillary: 85 mg/dL (ref 70–99)

## 2022-05-04 LAB — ABO/RH: ABO/RH(D): B POS

## 2022-05-04 SURGERY — ESOPHAGOGASTRODUODENOSCOPY (EGD) WITH PROPOFOL
Anesthesia: Monitor Anesthesia Care

## 2022-05-04 MED ORDER — METOCLOPRAMIDE HCL 5 MG/ML IJ SOLN
10.0000 mg | Freq: Once | INTRAMUSCULAR | Status: AC
  Administered 2022-05-04: 10 mg via INTRAVENOUS
  Filled 2022-05-04: qty 2

## 2022-05-04 MED ORDER — SODIUM CHLORIDE 0.9 % IV SOLN: INTRAVENOUS | Status: DC

## 2022-05-04 MED ORDER — PROPOFOL 10 MG/ML IV BOLUS
INTRAVENOUS | Status: DC | PRN
  Administered 2022-05-04 (×3): 10 mg via INTRAVENOUS

## 2022-05-04 MED ORDER — SODIUM CHLORIDE 0.9% FLUSH
3.0000 mL | Freq: Two times a day (BID) | INTRAVENOUS | Status: DC
  Administered 2022-05-04 – 2022-05-05 (×3): 3 mL via INTRAVENOUS

## 2022-05-04 MED ORDER — PANTOPRAZOLE SODIUM 40 MG IV SOLR: 40.0000 mg | Freq: Two times a day (BID) | INTRAVENOUS | Status: DC

## 2022-05-04 MED ORDER — PANTOPRAZOLE 80MG IVPB - SIMPLE MED
80.0000 mg | Freq: Once | INTRAVENOUS | Status: AC
  Administered 2022-05-04: 80 mg via INTRAVENOUS
  Filled 2022-05-04: qty 100

## 2022-05-04 MED ORDER — ORAL CARE MOUTH RINSE: 15.0000 mL | OROMUCOSAL | Status: DC | PRN

## 2022-05-04 MED ORDER — LACTATED RINGERS IV SOLN: INTRAVENOUS | Status: DC

## 2022-05-04 MED ORDER — ONDANSETRON HCL 4 MG PO TABS: 4.0000 mg | ORAL_TABLET | Freq: Four times a day (QID) | ORAL | Status: DC | PRN

## 2022-05-04 MED ORDER — ACETAMINOPHEN 650 MG RE SUPP: 650.0000 mg | Freq: Four times a day (QID) | RECTAL | Status: DC | PRN

## 2022-05-04 MED ORDER — ALBUTEROL SULFATE (2.5 MG/3ML) 0.083% IN NEBU: 2.5000 mg | INHALATION_SOLUTION | Freq: Four times a day (QID) | RESPIRATORY_TRACT | Status: DC | PRN

## 2022-05-04 MED ORDER — LIDOCAINE 2% (20 MG/ML) 5 ML SYRINGE
INTRAMUSCULAR | Status: DC | PRN
  Administered 2022-05-04 (×2): 40 mg via INTRAVENOUS

## 2022-05-04 MED ORDER — ONDANSETRON HCL 4 MG/2ML IJ SOLN: 4.0000 mg | Freq: Four times a day (QID) | INTRAMUSCULAR | Status: DC | PRN

## 2022-05-04 MED ORDER — ACETAMINOPHEN 325 MG PO TABS
650.0000 mg | ORAL_TABLET | Freq: Four times a day (QID) | ORAL | Status: DC | PRN
  Filled 2022-05-04: qty 2

## 2022-05-04 MED ORDER — LACTATED RINGERS IV SOLN: INTRAVENOUS | Status: DC | PRN

## 2022-05-04 MED ORDER — CALCIUM GLUCONATE-NACL 1-0.675 GM/50ML-% IV SOLN: 1.0000 g | Freq: Once | INTRAVENOUS | Status: DC

## 2022-05-04 MED ORDER — PANTOPRAZOLE INFUSION (NEW) - SIMPLE MED
8.0000 mg/h | INTRAVENOUS | Status: DC
  Administered 2022-05-04 – 2022-05-06 (×5): 8 mg/h via INTRAVENOUS
  Filled 2022-05-04 (×5): qty 100

## 2022-05-04 MED ORDER — PROPOFOL 500 MG/50ML IV EMUL
INTRAVENOUS | Status: DC | PRN
  Administered 2022-05-04: 100 ug/kg/min via INTRAVENOUS

## 2022-05-04 MED ORDER — PHENYLEPHRINE 80 MCG/ML (10ML) SYRINGE FOR IV PUSH (FOR BLOOD PRESSURE SUPPORT)
PREFILLED_SYRINGE | INTRAVENOUS | Status: DC | PRN
  Administered 2022-05-04 (×2): 80 ug via INTRAVENOUS

## 2022-05-04 SURGICAL SUPPLY — 15 items

## 2022-05-04 NOTE — Anesthesia Preprocedure Evaluation (Addendum)
Anesthesia Evaluation  Patient identified by MRN, date of birth, ID band Patient awake    Reviewed: Allergy & Precautions, NPO status , Patient's Chart, lab work & pertinent test results, reviewed documented beta blocker date and time   History of Anesthesia Complications Negative for: history of anesthetic complications  Airway Mallampati: II  TM Distance: >3 FB Neck ROM: Full    Dental  (+) Dental Advisory Given, Missing   Pulmonary former smoker   Pulmonary exam normal        Cardiovascular hypertension, Pt. on medications and Pt. on home beta blockers +CHF  Normal cardiovascular exam+ Valvular Problems/Murmurs MR    '20 TTE - EF 25-30%. There is mildly increased left ventricular wall thickness. Left ventricular diastolic Doppler parameters are consistent with pseudonormalization. Diffuse hypokinesis. The right ventricle has moderately reduced systolic function. TRight ventricular systolic pressure is moderately elevated with an estimated pressure of 61.0 mmHg. Left atrial size was severely dilated. Trivial pericardial effusion is present. Mitral valve regurgitation is mild to moderate      Neuro/Psych  PSYCHIATRIC DISORDERS  Depression     Neuromuscular disease CVA, Residual Symptoms    GI/Hepatic Neg liver ROS, PUD,,,  Endo/Other  diabetes, Type 2, Oral Hypoglycemic Agents    Renal/GU Renal disease     Musculoskeletal  (+) Arthritis ,  Fibromyalgia -  Abdominal   Peds  Hematology  (+) Blood dyscrasia, anemia  On plavix    Anesthesia Other Findings   Reproductive/Obstetrics                              Anesthesia Physical Anesthesia Plan  ASA: 4  Anesthesia Plan: MAC   Post-op Pain Management: Minimal or no pain anticipated   Induction:   PONV Risk Score and Plan: 2 and Propofol infusion and Treatment may vary due to age or medical condition  Airway Management Planned: Nasal  Cannula and Natural Airway  Additional Equipment: None  Intra-op Plan:   Post-operative Plan:   Informed Consent: I have reviewed the patients History and Physical, chart, labs and discussed the procedure including the risks, benefits and alternatives for the proposed anesthesia with the patient or authorized representative who has indicated his/her understanding and acceptance.       Plan Discussed with: CRNA and Anesthesiologist  Anesthesia Plan Comments:         Anesthesia Quick Evaluation

## 2022-05-04 NOTE — Anesthesia Procedure Notes (Signed)
Procedure Name: MAC Date/Time: 05/04/2022 3:34 PM  Performed by: Janene Harvey, CRNAPre-anesthesia Checklist: Patient identified, Emergency Drugs available, Suction available and Patient being monitored Oxygen Delivery Method: Nasal cannula Induction Type: IV induction Placement Confirmation: positive ETCO2 Dental Injury: Teeth and Oropharynx as per pre-operative assessment

## 2022-05-04 NOTE — H&P (Addendum)
History and Physical    Patient: Lisa [name] [id number] DOB: April 14, 1951 DOA: 05/04/2022 DOS: the patient was seen and examined on 05/04/2022 PCP: Charyl Dancer, NP  Patient coming from: Home via EMS  Chief Complaint:  Chief Complaint  Patient presents with   Emesis   Dizziness   HPI: Lisa [name] is a 72 y.o. female with medical history significant of hypertension, hyperlipidemia, systolic CHF, CVA, diabetes mellitus type 2, and PUD who presents with complaints of nausea and vomiting while dark red blood.  She had been in her normal state of health prior to today.  At around 7 AM she woke up and went to use the bathroom.  After sitting down on the toilet she said her whole body felt like it was on fire and she felt dizzy.  She urinated and thereafter looked in the toilet bowl and saw no blood present.  She was able to get back up but still felt very dizzy and went and sat on the couch in her living room.  Thereafter had acute onset of nausea with vomiting.  Emesis was reported to be dark red in color.  She had 3 episodes of vomiting for which she became very concerned.  She has had no abdominal pain prior or with the episodes.  She had not taken any of her medications this morning.  Denies any NSAID use.  Her stools had not been dark in color.  She reports that she has had ulcers since the age of 14, but had not had bleeding before.  Notes that she had been scoped last sometime in her 27s and told that she had a peptic ulcer.  She does not currently follow with gastroenterology and has not had any recent scopes or procedures.  She denies having any any other symptoms at this time.  Lisa [name] quit smoking several years ago at drinks 2 beers a week on average.   In the emergency department patient was noted to be afebrile with stable vital signs.  Labs significant for hemoglobin 9.5(hemoglobin 12.7 on 1/4), BUN 22, creatinine 1.06, and calcium 8.2.  Stools were noted to be brown  but guaiac positive.  Patient was typed and screened for possible need of blood products..  She had been started on a Protonix drip.  TR[facility] called to admit.  Review of Systems: As mentioned in the history of present illness. All other systems reviewed and are negative. Past Medical History:  Diagnosis Date   ARF (acute renal failure) ([place]) 11/18/2018   Diabetes mellitus ([place]) 2007   High cholesterol 2007   Hypertension    Peptic ulcer 1965   Stroke [facility])    Past Surgical History:  Procedure Laterality Date   CESAREAN SECTION     RIGHT/LEFT HEART CATH AND CORONARY ANGIOGRAPHY N/A 11/19/2018   Procedure: RIGHT/LEFT HEART CATH AND CORONARY ANGIOGRAPHY;  Surgeon: Dixie Dials, MD;  Location: [facility];  Service: Cardiovascular;  Laterality: N/A;   Social History:  reports that she quit smoking about 9 years ago. Her smoking use included cigarettes. She has a 6.25 pack-year smoking history. She quit smokeless tobacco use about 9 years ago. She reports current alcohol use. She reports that she does not use drugs.  Allergies  Allergen Reactions   Ace Inhibitors     Elevated Cr for 0.8 to 1.4 (30 percent or greater rise in serum creatinine)    Aspirin Other (See Comments)    Irritates ulcers   Codeine     "  upsets myulcers"   Hydrocodone     "upsets my ulcers"    Family History  Problem Relation Age of Onset   Kidney disease Father    Heart disease Father    Cancer Brother        Throat    Birth defects Son     Prior to Admission medications   Medication Sig Start Date End Date Taking? Authorizing Provider  Alcohol Swabs (DROPSAFE ALCOHOL PREP) 70 % PADS Apply topically. 03/20/22   [provider]  amLODipine (NORVASC) 5 MG tablet Take 5 mg by mouth daily.    [provider]  aspirin 81 MG chewable tablet Chew 81 mg by mouth daily.    [provider]  atorvastatin (LIPITOR) 40 MG tablet Take 40 mg by mouth daily.    [provider]  Blood  Glucose Monitoring Suppl (ONE TOUCH ULTRA 2) w/Device KIT 1 each by Does not apply route daily at 6 (six) AM. 04/17/22   [name], [name], NP  carvedilol (COREG) 12.5 MG tablet Take 12.5 mg by mouth 2 (two) times daily with a meal.    [provider]  citalopram (CELEXA) 20 MG tablet Take 1 tablet (20 mg total) by mouth daily. 04/17/22   [name], [name], NP  clopidogrel (PLAVIX) 75 MG tablet Take 75 mg by mouth daily.    [provider]  diclofenac Sodium (VOLTAREN) 1 % GEL APPLY 2 GRAMS TOPICALLY FOUR TIMES DAILY 03/20/22   Ganta, Anupa, DO  gabapentin (NEURONTIN) 100 MG capsule Take 1 capsule (100 mg total) by mouth at bedtime. 04/17/22   [name], [name], NP  glucose blood (ONETOUCH ULTRA) test strip Use as instructed 04/17/22   [name], [name], NP  hydrALAZINE (APRESOLINE) 25 MG tablet Take 25 mg by mouth 2 (two) times daily.    [provider]  melatonin 3 MG TABS tablet TAKE 1 TABLET AT BEDTIME 03/17/22   Ganta, Anupa, DO  metFORMIN (GLUCOPHAGE) 500 MG tablet Take 250 mg by mouth 2 (two) times daily with a meal.    [provider]  sacubitril-valsartan (ENTRESTO) 49-51 MG Take 1 tablet by mouth 2 (two) times daily.    [provider]    Physical Exam: Vitals:   05/04/22 1015 05/04/22 1045 05/04/22 1100 05/04/22 1130  BP: (!) 152/76 122/61 136/62 (!) 146/68  Pulse: 76 70 68 72  Resp: 18 18 15 14   Temp:      TempSrc:      SpO2: 100% 100% 100% 100%   Exam  Constitutional: Elderly female who appears to be in no acute distress at this time Eyes: PERRL, lids and conjunctivae normal ENMT: Mucous membranes are moist.  Missing teeth. Neck: normal, supple  Respiratory: clear to auscultation bilaterally, no wheezing, no crackles. Normal respiratory effort. No accessory muscle use.  Cardiovascular: Regular rate and rhythm, no murmurs / rubs / gallops. No extremity edema.  Abdomen: no tenderness, no masses palpated.  Bowel sounds positive.   Musculoskeletal: no clubbing / cyanosis. No joint deformity upper and lower extremities. Good ROM, no contractures. Normal muscle tone.  Skin: no rashes, lesions, ulcers. No induration Neurologic: CN 2-12 grossly intact. Sensation intact, DTR normal. Strength 5/5 in all 4.  Psychiatric: Normal judgment and insight. Alert and oriented x 3. Normal mood.   Data Reviewed:   EKG revealed normal sinus rhythm with ventricular trigeminy at 77 bpm.  Reviewed labs, imaging, and pertinent records as noted above in HPI Assessment and Plan:  Hematemesis with nausea  Acute blood loss secondary to suspected upper GI bleed History of PUD Patient presented with complaints of dizziness and subsequent episodes of nausea and vomiting.  Emesis reported to be dark red in color.  Stools were reported to be positive, but brown in color.  Hemoglobin noted to be 9.7, but previously had been 12.7 on 1/4. Risk factors include patient being on Plavix and prior history of PUD.  Denied any NSAID use and only drinks drinks 1-2 beers per week.  No recent EGD or colonoscopy reported and does not follow with GI.  Patient had been started on Protonix drip due to concern for upper GI bleed and gastroenterology have been consulted. -Admit to a progressive bed due to the risks of decompensation -Aspiration precautions with elevation of the head of the bed -N.p.o. -Check chest x-ray -Serial monitoring of H&H and transfuse blood products as needed for symptomatic anemia or hemoglobin less than 8 g/dL given heart history. -Hold Plavix -Continue Protonix gtt -Antiemetics as needed -Appreciate [facility] consultative services, we will follow-up for any further recommendations  Essential hypertension Blood pressures are currently maintained 122/61 and 152/76. -Hold blood pressure regimen in the setting of suspected acute upper GI bleed.  Continue to monitor and determine when medically appropriate to resume  Systolic and  diastolic congestive heart failure Last EF noted to be 40-34% with diastolic parameters consistent with pseudonormalization back in 2020.  Patient appears to be euvolemic at this time no significant leg swelling or JVD. -Strict I&O's and daily weights -Restrict fluids to 1500 mL given heart function  History of CVA Patient with prior history of right brainstem infarct back in 2013.  She had been on Plavix -Hold Plavix  Controlled diabetes mellitus type 2, without long-term use of insulin Last hemoglobin A1c was 5.8 on 1/4. -Hold metformin  Hyperlipidemia -Resume atorvastatin when medically appropriate  History of polysubstance abuse Progress note prior history of cocaine and marijuana use positive drug screen back in 11/2018.  DVT prophylaxis: SCDs Advance Care Planning:   Code Status: Full Code   Consults: GI  Family Communication: Family  Severity of Illness: The appropriate patient status for this patient is OBSERVATION. Observation status is judged to be reasonable and necessary in order to provide the required intensity of service to ensure the patient's safety. The patient's presenting symptoms, physical exam findings, and initial radiographic and laboratory data in the context of their medical condition is felt to place them at decreased risk for further clinical deterioration. Furthermore, it is anticipated that the patient will be medically stable for discharge from the hospital within 2 midnights of admission.   Author: Norval Morton, MD 05/04/2022 11:41 AM  For on call review [email or web address].

## 2022-05-04 NOTE — Op Note (Signed)
[facility] Patient Name: Lisa [name] Procedure Date : 05/04/2022 MRN: [id number] Attending MD: Lisa [name]. Lisa [name] , MD, [id number] Date of Birth: February 04, 1951 CSN: [id number] Age: 72 Admit Type: Inpatient Procedure:                Upper GI endoscopy Indications:              Hematemesis / anemia - on Plavix and aspirin Providers:                Lisa [name]. Lisa Moros, MD, Lisa [name],                            Technician, Lisa Oms RN Referring MD:              Medicines:                Monitored Anesthesia Care Complications:            No immediate complications. Estimated blood loss:                            None. Estimated Blood Loss:     Estimated blood loss: none. Procedure:                Pre-Anesthesia Assessment:                           - Prior to the procedure, a History and Physical                            was performed, and patient medications and                            allergies were reviewed. The patient's tolerance of                            previous anesthesia was also reviewed. The risks                            and benefits of the procedure and the sedation                            options and risks were discussed with the patient.                            All questions were answered, and informed consent                            was obtained. Prior Anticoagulants: The patient has                            taken Plavix (clopidogrel), last dose was 1 day                            prior to procedure. ASA Grade Assessment: IV - A  patient with severe systemic disease that is a                            constant threat to life. After reviewing the risks                            and benefits, the patient was deemed in                            satisfactory condition to undergo the procedure.                           After obtaining informed consent, the endoscope was                             passed under direct vision. Throughout the                            procedure, the patient's blood pressure, pulse, and                            oxygen saturations were monitored continuously. The                            [id number]-[id number] ([id number] Olympus endoscope was introduced                            through the mouth, and advanced to the second part                            of duodenum. The upper GI endoscopy was                            accomplished without difficulty. The patient                            tolerated the procedure well. Scope In: Scope Out: Findings:      Esophagogastric landmarks were identified: the Z-line was found at 41       cm, the gastroesophageal junction was found at 41 cm and the upper       extent of the gastric folds was found at 41 cm from the incisors.      The exam of the esophagus was otherwise normal.      Three non-bleeding cratered gastric ulcers with no stigmata of bleeding       were found in the prepyloric region of the stomach. The largest lesion       was 4-5 mm in largest dimension with a very small flat pigmented area.       The other ulcers were diminutive - few mm in size.      Two superficial clean based gastric ulcers with no stigmata of bleeding       were found in the gastric body. The largest lesion was 6 mm in largest       dimension.      Diffuse mild inflammation characterized  by erythema and friability was       found in the entire examined stomach, which was also atrophic in       appearance.      The exam of the stomach was otherwise normal.      The examined duodenum was normal. Impression:               - Esophagogastric landmarks identified.                           - Normal esophagus otherwise                           - 5 gastric ulcers - 3 pre-pyloric area, 2 gastric                            body - all small - no active bleeding                           - Gastritis / atrophic gastric mucosa.                            - Normal stomach otherwise                           - Normal examined duodenum.                           Small gastric ulcers were the cause of her                            symptoms. No high risk stigmata for bleeding but                            given recent bleeding, antiplatelet use, would keep                            in the hospital overnight for observation on PPI. Recommendation:           - Return patient to hospital ward for ongoing care.                           - Clear liquid diet okay tonight, advance diet                            tomorrow as tolerated                           - Continue present medications.                           - Continue protonix 40mg  BID for now                           - Will need H pylori testing - would do H pylori  IgG while in hospital and treat if positive (I do                            not think this is in Epic but have discussed with                            lab). If not possible can do H pylori testing as                            outpatient in our office                           - Upon discharge will need protonix 40mg  PO BID for                            6 weeks, then once daily thereafter                           - Hold Plavix tonight, can resume in 48 hours if                            she is otherwise stable                           - Can discharge later tomorrow if no further                            bleeding and Hgb stable                           - She can follow up in our office in a few weeks or                            so, will need follow up EGD to confirm mucosal                            healing as outpatient                           - We will sign off for now, call with questions in                            the interim Procedure Code(s):        --- Professional ---                           [phone], Esophagogastroduodenoscopy, flexible,                             transoral; diagnostic, including collection of                            specimen(s) by brushing or washing, when performed                            (  separate procedure) Diagnosis Code(s):        --- Professional ---                           K25.9, Gastric ulcer, unspecified as acute or                            chronic, without hemorrhage or perforation                           K29.70, Gastritis, unspecified, without bleeding                           K92.0, Hematemesis CPT copyright 2022 American Medical Association. All rights reserved. The codes documented in this report are preliminary and upon coder review may  be revised to meet current compliance requirements. Remo Lipps P. Keithen Capo, MD 05/04/2022 4:11:42 PM This report has been signed electronically. Number of Addenda: 0

## 2022-05-04 NOTE — ED Notes (Signed)
Granddaughter, Delana Meyer called and updated on patient care per pt

## 2022-05-04 NOTE — ED Notes (Signed)
RN provided peri care, new brief, linens, and warm blanket. Pt requested to throw belongings in trash d/t being soiled and covered in blood.   RN provided pt with paper scrub bottoms.

## 2022-05-04 NOTE — Anesthesia Postprocedure Evaluation (Signed)
Anesthesia Post Note  Patient: Lisa [name]  Procedure(s) Performed: ESOPHAGOGASTRODUODENOSCOPY (EGD) WITH PROPOFOL     Patient location during evaluation: PACU Anesthesia Type: MAC Level of consciousness: awake and alert Pain management: pain level controlled Vital Signs Assessment: post-procedure vital signs reviewed and stable Respiratory status: spontaneous breathing, nonlabored ventilation and respiratory function stable Cardiovascular status: stable and blood pressure returned to baseline Anesthetic complications: no   No notable events documented.  Last Vitals:  Vitals:   05/04/22 1621 05/04/22 1646  BP: 126/63 (!) 145/55  Pulse: 68 70  Resp: 15   Temp:    SpO2: 97% 99%    Last Pain:  Vitals:   05/04/22 1621  TempSrc:   PainSc: 0-No pain                 Audry Pili

## 2022-05-04 NOTE — Consult Note (Signed)
Consultation Note   Referring Provider:  [facility] PCP: Gerre Scull, NP Primary Gastroenterologist: Gentry Fitz        Reason for consultation: Hematemesis    Hospital Day: 1  Assessment    # 72 yo female with three episodes of dark red hematemesis at home this am. On plavix + aspirin. Hemodynamically stable. Hgb 9.5, down from baseline of 12.7. May have PUD. Mallory Weiss tear seems unlikely since there was no episodes of vomiting prior to the hematemesis.  # Hx of CVA, on plavix + asa   # Chronic systolic heart failure.  On Entresto. Her last echocardiogram in 2020 showed an EF of 25-30%. It also showed moderate diastolic dysfunction, mild to moderate mitral valve regurgitation and mild tricupsid valve regurgitation with moderate pulmonary hypertension.   # See PMH for additional medical problems   Plan   She will need an EGD but not sure if will be done today or tomorrow. The risks and benefits of EGD with possible biopsies were discussed with the patient who agrees to proceed. Endoscopic interventions may be limited at time of EGD due to recent plavix use Keep NPO for now. If EGD not going to be done today then can have clear liquids.  Continue PPI infusion  Trend hgb   History of Present Illness:  Patient is a 72 yo female with a PMH of DM, CVA on plavix + asa, chronic systolic heart failure, HTN, depression.   See PMH for any additional medical problems.She presented to ED today after 3 episodes of hematemesis ( bright red blood). She is on plavix and asa. She is hemodynamically stable. Hgb 9.5, down from baseline of 12.3. MCV elevated. B12 low normal. BUN is normal.  Lisa [name] felt fine when she went to bed last night. This morning she felt dizzy and subsequently had three episodes of dark bloody emesis which she describes as large volume. No blood in stools or dark stools. Other than daily asa she doesn't take  NSAIDS. She hasn't had any esophageal or abdominal pain. She recently lost weight due to a food shortage living with a relative but after moving in with a different relative she has access to food and is gaining weight back. She gives a history of peptic ulcers 20 or so years ago.    Previous GI Evaluation    Nothing in last 20 years.    Recent Labs and Imaging No results found.  Labs:  Recent Labs    05/04/22 1018 05/04/22 1055  WBC 8.3  --   HGB 9.7* 9.5*  HCT 30.4* 28.0*  PLT 178  --    Recent Labs    05/04/22 1018 05/04/22 1055  NA 138 140  K 4.6 4.6  CL 107 106  CO2 22  --   GLUCOSE 163* 158*  BUN 22 23  CREATININE 1.06* 1.10*  CALCIUM 8.2*  --    Recent Labs    05/04/22 1018  PROT 5.7*  ALBUMIN 3.0*  AST 13*  ALT 7  ALKPHOS 41  BILITOT 0.6   No results for input(s): "HEPBSAG", "HCVAB", "HEPAIGM", "HEPBIGM" in the last 72 hours. No results for input(s): "LABPROT", "INR" in the last 72 hours.  Past Medical  History:  Diagnosis Date   ARF (acute renal failure) ([place]) 11/18/2018   Diabetes mellitus ([place]) 2007   High cholesterol 2007   Hypertension    Peptic ulcer 1965   Stroke [facility])     Past Surgical History:  Procedure Laterality Date   CESAREAN SECTION     RIGHT/LEFT HEART CATH AND CORONARY ANGIOGRAPHY N/A 11/19/2018   Procedure: RIGHT/LEFT HEART CATH AND CORONARY ANGIOGRAPHY;  Surgeon: Dixie Dials, MD;  Location: [place] [facility];  Service: Cardiovascular;  Laterality: N/A;    Family History  Problem Relation Age of Onset   Kidney disease Father    Heart disease Father    Cancer Brother        Throat    Birth defects Son     Prior to Admission medications   Medication Sig Start Date End Date Taking? Authorizing Provider  Alcohol Swabs (DROPSAFE ALCOHOL PREP) 70 % PADS Apply topically. 03/20/22   [provider]  amLODipine (NORVASC) 5 MG tablet Take 5 mg by mouth daily.    [provider]  aspirin 81 MG chewable tablet  Chew 81 mg by mouth daily.    [provider]  atorvastatin (LIPITOR) 40 MG tablet Take 40 mg by mouth daily.    [provider]  Blood Glucose Monitoring Suppl (ONE TOUCH ULTRA 2) w/Device KIT 1 each by Does not apply route daily at 6 (six) AM. 04/17/22   [name], [name], NP  carvedilol (COREG) 12.5 MG tablet Take 12.5 mg by mouth 2 (two) times daily with a meal.    [provider]  citalopram (CELEXA) 20 MG tablet Take 1 tablet (20 mg total) by mouth daily. 04/17/22   [name], [name], NP  clopidogrel (PLAVIX) 75 MG tablet Take 75 mg by mouth daily.    [provider]  diclofenac Sodium (VOLTAREN) 1 % GEL APPLY 2 GRAMS TOPICALLY FOUR TIMES DAILY 03/20/22   Ganta, Anupa, DO  gabapentin (NEURONTIN) 100 MG capsule Take 1 capsule (100 mg total) by mouth at bedtime. 04/17/22   [name], [name], NP  glucose blood (ONETOUCH ULTRA) test strip Use as instructed 04/17/22   [name], [name], NP  hydrALAZINE (APRESOLINE) 25 MG tablet Take 25 mg by mouth 2 (two) times daily.    [provider]  melatonin 3 MG TABS tablet TAKE 1 TABLET AT BEDTIME 03/17/22   Ganta, Anupa, DO  metFORMIN (GLUCOPHAGE) 500 MG tablet Take 250 mg by mouth 2 (two) times daily with a meal.    [provider]  sacubitril-valsartan (ENTRESTO) 49-51 MG Take 1 tablet by mouth 2 (two) times daily.    [provider]    Current Facility-Administered Medications  Medication Dose Route Frequency Provider Last Rate Last Admin   pantoprazole (PROTONIX) 80 mg /NS 100 mL IVPB  80 mg Intravenous Once Malvin Johns, MD 300 mL/hr at 05/04/22 1112 80 mg at 05/04/22 1112   [START ON 05/07/2022] pantoprazole (PROTONIX) injection 40 mg  40 mg Intravenous Q12H Belfi, Threasa Beards, MD       pantoprozole (PROTONIX) 80 mg /NS 100 mL infusion  8 mg/hr Intravenous Continuous Malvin Johns, MD       Current Outpatient Medications  Medication Sig Dispense Refill   Alcohol Swabs (DROPSAFE ALCOHOL PREP) 70  % PADS Apply topically.     amLODipine (NORVASC) 5 MG tablet Take 5 mg by mouth daily.     aspirin 81 MG chewable tablet Chew 81 mg by mouth daily.  atorvastatin (LIPITOR) 40 MG tablet Take 40 mg by mouth daily.     Blood Glucose Monitoring Suppl (ONE TOUCH ULTRA 2) w/Device KIT 1 each by Does not apply route daily at 6 (six) AM. 1 kit 0   carvedilol (COREG) 12.5 MG tablet Take 12.5 mg by mouth 2 (two) times daily with a meal.     citalopram (CELEXA) 20 MG tablet Take 1 tablet (20 mg total) by mouth daily. 90 tablet 1   clopidogrel (PLAVIX) 75 MG tablet Take 75 mg by mouth daily.     diclofenac Sodium (VOLTAREN) 1 % GEL APPLY 2 GRAMS TOPICALLY FOUR TIMES DAILY 400 g 3   gabapentin (NEURONTIN) 100 MG capsule Take 1 capsule (100 mg total) by mouth at bedtime. 90 capsule 0   glucose blood (ONETOUCH ULTRA) test strip Use as instructed 100 each 12   hydrALAZINE (APRESOLINE) 25 MG tablet Take 25 mg by mouth 2 (two) times daily.     melatonin 3 MG TABS tablet TAKE 1 TABLET AT BEDTIME 90 tablet 3   metFORMIN (GLUCOPHAGE) 500 MG tablet Take 250 mg by mouth 2 (two) times daily with a meal.     sacubitril-valsartan (ENTRESTO) 49-51 MG Take 1 tablet by mouth 2 (two) times daily.      Allergies as of 05/04/2022 - Review Complete 05/04/2022  Allergen Reaction Noted   Ace inhibitors  03/04/2013   Aspirin Other (See Comments) 11/19/2011   Codeine  11/19/2011   Hydrocodone  11/19/2011    Social History   Socioeconomic History   Marital status: Widowed    Spouse name: Mervin Diver   Number of children: 2   Years of education: 11   Highest education level: Not on file  Occupational History   Occupation: Homemaker   Tobacco Use   Smoking status: Former    Packs/day: 0.25    Years: 25.00    Total pack years: 6.25    Types: Cigarettes    Quit date: 04/07/2013    Years since quitting: 9.0   Smokeless tobacco: Former    Quit date: 04/07/2013   Tobacco comments:    2 cigs a day  Vaping Use    Vaping Use: Never used  Substance and Sexual Activity   Alcohol use: Yes    Comment: Socially, drinks 3 drinks twice per week    Drug use: No   Sexual activity: Yes  Other Topics Concern   Not on file  Social History Narrative   Lives with cousin. Was homeless for 1 year.Husband died 2012/05/01.    Social Determinants of Health   Financial Resource Strain: Medium Risk (04/24/2022)   Overall Financial Resource Strain (CARDIA)    Difficulty of Paying Living Expenses: Somewhat hard  Food Insecurity: No Food Insecurity (04/29/2022)   Hunger Vital Sign    Worried About Running Out of Food in the Last Year: Never true    Ran Out of Food in the Last Year: Never true  Transportation Needs: No Transportation Needs (04/29/2022)   PRAPARE - Hydrologist (Medical): No    Lack of Transportation (Non-Medical): No  Physical Activity: Inactive (04/24/2022)   Exercise Vital Sign    Days of Exercise per Week: 0 days    Minutes of Exercise per Session: 0 min  Stress: No Stress Concern Present (04/24/2022)   [place]    Feeling of Stress : Not at all  Social Connections: Moderately Isolated (  04/24/2022)   Social Connection and Isolation Panel [NHANES]    Frequency of Communication with Friends and Family: More than three times a week    Frequency of Social Gatherings with Friends and Family: More than three times a week    Attends Religious Services: More than 4 times per year    Active Member of [employer] or Organizations: No    Attends Banker Meetings: Not on file    Marital Status: Widowed  Intimate Partner Violence: Not At Risk (04/24/2022)   Humiliation, Afraid, Rape, and Kick questionnaire    Fear of Current or Ex-Partner: No    Emotionally Abused: No    Physically Abused: No    Sexually Abused: No    Review of Systems: All systems reviewed and negative except where noted in  HPI.  Physical Exam: Vital signs in last 24 hours: Temp:  [98.1 F (36.7 C)] 98.1 F (36.7 C) (01/21 1011) Pulse Rate:  [68-76] 68 (01/21 1100) Resp:  [15-18] 15 (01/21 1100) BP: (122-152)/(61-76) 136/62 (01/21 1100) SpO2:  [100 %] 100 % (01/21 1100)    General:  Alert thin female in NAD Psych:  Pleasant, cooperative. Normal mood and affect Eyes: Pupils equal Ears:  Normal auditory acuity Nose: No deformity, discharge or lesions Neck:  Supple, no masses felt Lungs:  Clear to auscultation.  Heart:  Regular rate, regular rhythm.  Abdomen:  Soft, nondistended, nontender, active bowel sounds, no masses felt Rectal :  Deferred Msk: Symmetrical without gross deformities.  Neurologic:  Alert, oriented, grossly normal neurologically Extremities : No edema Skin:  Intact without significant lesions.    Intake/Output from previous day: No intake/output data recorded. Intake/Output this shift:  No intake/output data recorded.    Active Problems:   * No active hospital problems. Lisa Cluster, Lisa [name] @  05/04/2022, 11:30 AM

## 2022-05-04 NOTE — ED Notes (Signed)
Pt gives permission [name] to get updates on her care. [name], first cousin, states to call once pt is settle in IP room

## 2022-05-04 NOTE — ED Notes (Signed)
Provided pt warm blanket 

## 2022-05-04 NOTE — Transfer of Care (Signed)
Immediate Anesthesia Transfer of Care Note  Patient: Lisa [name]  Procedure(s) Performed: ESOPHAGOGASTRODUODENOSCOPY (EGD) WITH PROPOFOL  Patient Location: Endoscopy Unit  Anesthesia Type:MAC  Level of Consciousness: drowsy and patient cooperative  Airway & Oxygen Therapy: Patient Spontanous Breathing and Patient connected to nasal cannula oxygen  Post-op Assessment: Report given to RN and Post -op Vital signs reviewed and stable  Post vital signs: Reviewed and stable  Last Vitals:  Vitals Value Taken Time  BP 114/58 05/04/22 1601  Temp    Pulse 76 05/04/22 1602  Resp 16 05/04/22 1602  SpO2 100 % 05/04/22 1602  Vitals shown include unvalidated device data.  Last Pain:  Vitals:   05/04/22 1434  TempSrc: Temporal  PainSc:          Complications: No notable events documented.

## 2022-05-04 NOTE — ED Triage Notes (Signed)
PT BIB [facility]. Pt woke up this morning to use the bathroom and felt dizzy. She had an episode of diarrhea, denies seeing any blood in stool. When she got out of the bathroom and sat down she vomited and it was bright red. Pt denies any pain. Pt is on plavix.   BP 140/56 HR 80 100% room air

## 2022-05-04 NOTE — ED Provider Notes (Signed)
[place] EMERGENCY DEPARTMENT AT [facility] Provider Note   CSN: [id number] Arrival date & time: 05/04/22  4270     History  Chief Complaint  Patient presents with   Emesis   Dizziness    Lisa [name] is a 72 y.o. female.  Patient is a 72 year old female who presents with hematemesis.  She has a history of diabetes, prior stroke in 2013 and CHF.  She is on Plavix and aspirin per chart review.  She reports that she felt a little dizzy and lightheaded this morning when she got up.  She went to the bathroom and had a normal bowel movement.  She has not noted any blood in her stools or black stools.  She then had some vomiting.  She said she threw up 3 times and it was all bright red blood.  She does have a history of peptic ulcer disease per her report although she says they have not acted up in a long time.  She denies a history of heavy alcohol use.  She says she drinks beer occasionally but she says she only will have about a 40 ounce beer every 2 months.  She denies any history of heavy alcohol use in the past.  Denies any history of known liver disease.  She denies any abdominal pain.  No shortness of breath.       Home Medications Prior to Admission medications   Medication Sig Start Date End Date Taking? Authorizing Provider  Alcohol Swabs (DROPSAFE ALCOHOL PREP) 70 % PADS Apply topically. 03/20/22   [provider]  amLODipine (NORVASC) 5 MG tablet Take 5 mg by mouth daily.    [provider]  aspirin 81 MG chewable tablet Chew 81 mg by mouth daily.    [provider]  atorvastatin (LIPITOR) 40 MG tablet Take 40 mg by mouth daily.    [provider]  Blood Glucose Monitoring Suppl (ONE TOUCH ULTRA 2) w/Device KIT 1 each by Does not apply route daily at 6 (six) AM. 04/17/22   [name], [name], NP  carvedilol (COREG) 12.5 MG tablet Take 12.5 mg by mouth 2 (two) times daily with a meal.    [provider]  citalopram  (CELEXA) 20 MG tablet Take 1 tablet (20 mg total) by mouth daily. 04/17/22   [name], [name], NP  clopidogrel (PLAVIX) 75 MG tablet Take 75 mg by mouth daily.    [provider]  diclofenac Sodium (VOLTAREN) 1 % GEL APPLY 2 GRAMS TOPICALLY FOUR TIMES DAILY 03/20/22   Ganta, Anupa, DO  gabapentin (NEURONTIN) 100 MG capsule Take 1 capsule (100 mg total) by mouth at bedtime. 04/17/22   [name], [name], NP  glucose blood (ONETOUCH ULTRA) test strip Use as instructed 04/17/22   [name], [name], NP  hydrALAZINE (APRESOLINE) 25 MG tablet Take 25 mg by mouth 2 (two) times daily.    [provider]  melatonin 3 MG TABS tablet TAKE 1 TABLET AT BEDTIME 03/17/22   Ganta, Anupa, DO  metFORMIN (GLUCOPHAGE) 500 MG tablet Take 250 mg by mouth 2 (two) times daily with a meal.    [provider]  sacubitril-valsartan (ENTRESTO) 49-51 MG Take 1 tablet by mouth 2 (two) times daily.    [provider]      Allergies    Ace inhibitors, Aspirin, Codeine, and Hydrocodone    Review of Systems   Review of Systems  Constitutional:  Negative for chills, diaphoresis, fatigue and fever.  HENT:  Negative for congestion, rhinorrhea and sneezing.   Eyes: Negative.   Respiratory:  Negative for cough, chest tightness and shortness of breath.   Cardiovascular:  Negative for chest pain and leg swelling.  Gastrointestinal:  Positive for nausea and vomiting. Negative for abdominal pain and diarrhea.       Hematemesis  Genitourinary:  Negative for difficulty urinating, flank pain, frequency and hematuria.  Musculoskeletal:  Negative for arthralgias and back pain.  Skin:  Negative for rash.  Neurological:  Positive for dizziness and light-headedness. Negative for speech difficulty, weakness, numbness and headaches.    Physical Exam Updated Vital Signs BP (!) 146/68   Pulse 72   Temp 98.1 F (36.7 C) (Oral)   Resp 14   SpO2 100%  Physical Exam Constitutional:      Appearance: She is  well-developed.  HENT:     Head: Normocephalic and atraumatic.  Eyes:     Pupils: Pupils are equal, round, and reactive to light.  Cardiovascular:     Rate and Rhythm: Normal rate and regular rhythm.     Heart sounds: Normal heart sounds.  Pulmonary:     Effort: Pulmonary effort is normal. No respiratory distress.     Breath sounds: Normal breath sounds. No wheezing or rales.  Chest:     Chest wall: No tenderness.  Abdominal:     General: Bowel sounds are normal.     Palpations: Abdomen is soft.     Tenderness: There is no abdominal tenderness. There is no guarding or rebound.  Genitourinary:    Comments: Rectal exam shows brown soft stool, no gross blood, chaparone RN Larene Beach present during this Musculoskeletal:        General: Normal range of motion.     Cervical back: Normal range of motion and neck supple.  Lymphadenopathy:     Cervical: No cervical adenopathy.  Skin:    General: Skin is warm and dry.     Findings: No rash.  Neurological:     Mental Status: She is alert and oriented to person, place, and time.     ED Results / Procedures / Treatments   Labs (all labs ordered are listed, but only abnormal results are displayed) Labs Reviewed  COMPREHENSIVE METABOLIC PANEL - Abnormal; Notable for the following components:      Result Value   Glucose, Bld 163 (*)    Creatinine, Ser 1.06 (*)    Calcium 8.2 (*)    Total Protein 5.7 (*)    Albumin 3.0 (*)    AST 13 (*)    GFR, Estimated 57 (*)    All other components within normal limits  CBC WITH DIFFERENTIAL/PLATELET - Abnormal; Notable for the following components:   RBC 2.95 (*)    Hemoglobin 9.7 (*)    HCT 30.4 (*)    MCV 103.1 (*)    All other components within normal limits  I-STAT CHEM 8, ED - Abnormal; Notable for the following components:   Creatinine, Ser 1.10 (*)    Glucose, Bld 158 (*)    Calcium, Ion 1.12 (*)    Hemoglobin 9.5 (*)    HCT 28.0 (*)    All other components within normal limits   LIPASE, BLOOD  POC OCCULT BLOOD, ED  TYPE AND SCREEN  ABO/RH    EKG None  Radiology No results found.  Procedures Procedures    Medications Ordered in ED Medications  pantoprozole (PROTONIX) 80 mg /NS 100 mL infusion (has no administration  in time range)  pantoprazole (PROTONIX) injection 40 mg (has no administration in time range)  pantoprazole (PROTONIX) 80 mg /NS 100 mL IVPB (0 mg Intravenous Stopped 05/04/22 1138)    ED Course/ Medical Decision Making/ A&P                             Medical Decision Making Amount and/or Complexity of Data Reviewed Labs: ordered.  Risk Prescription drug management. Decision regarding hospitalization.   Patient is a 72 year old female who presents with bright red blood emesis.  She has had no further episodes of emesis since arrival to the ED.  No associate abdominal pain.  Reported history of peptic ulcer disease but no history of liver disease, varices or alcohol use disorder per her report.  Her hemoglobin is around 9 which is a drop from her baseline around 12.  Her stool was Hemoccult positive although no melena or gross blood.  She was typed and screened.  She was started on IV Protonix as well as a drip.  Her vital signs are currently stable.  I spoke with Nevin Bloodgood, the NP on-call for [facility] gastroenterology who will see the patient.  She recommends that the patient remain n.p.o. currently as since the patient is on Plavix, they may do a more emergent endoscopy.  I spoke with Dr. Tamala Julian who will admit the patient for further treatment.  CRITICAL CARE Performed by: Malvin Johns Total critical care time: 45 minutes Critical care time was exclusive of separately billable procedures and treating other patients. Critical care was necessary to treat or prevent imminent or life-threatening deterioration. Critical care was time spent personally by me on the following activities: development of treatment plan with patient and/or surrogate  as well as nursing, discussions with consultants, evaluation of patient's response to treatment, examination of patient, obtaining history from patient or surrogate, ordering and performing treatments and interventions, ordering and review of laboratory studies, ordering and review of radiographic studies, pulse oximetry and re-evaluation of patient's condition.   Final Clinical Impression(s) / ED Diagnoses Final diagnoses:  Gastrointestinal hemorrhage, unspecified gastrointestinal hemorrhage type    Rx / DC Orders ED Discharge Orders     None         Malvin Johns, MD 05/04/22 1154

## 2022-05-04 NOTE — ED Notes (Signed)
Provided pt slip resistant socks ? ?

## 2022-05-05 ENCOUNTER — Telehealth: Payer: Self-pay

## 2022-05-05 DIAGNOSIS — I5042 Chronic combined systolic (congestive) and diastolic (congestive) heart failure: Secondary | ICD-10-CM | POA: Diagnosis not present

## 2022-05-05 DIAGNOSIS — K922 Gastrointestinal hemorrhage, unspecified: Secondary | ICD-10-CM | POA: Diagnosis not present

## 2022-05-05 DIAGNOSIS — I10 Essential (primary) hypertension: Secondary | ICD-10-CM | POA: Diagnosis not present

## 2022-05-05 DIAGNOSIS — D62 Acute posthemorrhagic anemia: Secondary | ICD-10-CM | POA: Diagnosis not present

## 2022-05-05 LAB — CBC
HCT: 21.5 % — ABNORMAL LOW (ref 36.0–46.0)
HCT: 27.4 % — ABNORMAL LOW (ref 36.0–46.0)
Hemoglobin: 7.4 g/dL — ABNORMAL LOW (ref 12.0–15.0)
Hemoglobin: 9.6 g/dL — ABNORMAL LOW (ref 12.0–15.0)
MCH: 33.3 pg (ref 26.0–34.0)
MCH: 31.9 pg (ref 26.0–34.0)
MCHC: 34.4 g/dL (ref 30.0–36.0)
MCHC: 35 g/dL (ref 30.0–36.0)
MCV: 96.8 fL (ref 80.0–100.0)
MCV: 91 fL (ref 80.0–100.0)
Platelets: 140 10*3/uL — ABNORMAL LOW (ref 150–400)
Platelets: 147 10*3/uL — ABNORMAL LOW (ref 150–400)
RBC: 2.22 MIL/uL — ABNORMAL LOW (ref 3.87–5.11)
RBC: 3.01 MIL/uL — ABNORMAL LOW (ref 3.87–5.11)
RDW: 12.8 % (ref 11.5–15.5)
RDW: 16.9 % — ABNORMAL HIGH (ref 11.5–15.5)
WBC: 6.6 10*3/uL (ref 4.0–10.5)
WBC: 6.5 10*3/uL (ref 4.0–10.5)
nRBC: 0 % (ref 0.0–0.2)
nRBC: 0 % (ref 0.0–0.2)

## 2022-05-05 LAB — BASIC METABOLIC PANEL
Anion gap: 6 (ref 5–15)
BUN: 22 mg/dL (ref 8–23)
CO2: 25 mmol/L (ref 22–32)
Calcium: 8.1 mg/dL — ABNORMAL LOW (ref 8.9–10.3)
Chloride: 107 mmol/L (ref 98–111)
Creatinine, Ser: 0.96 mg/dL (ref 0.44–1.00)
GFR, Estimated: >60 mL/min (ref >60)
Glucose, Bld: 107 mg/dL — ABNORMAL HIGH (ref 70–99)
Potassium: 3.6 mmol/L (ref 3.5–5.1)
Sodium: 138 mmol/L (ref 135–145)

## 2022-05-05 LAB — PREPARE RBC (CROSSMATCH)

## 2022-05-05 LAB — GLUCOSE, CAPILLARY
Glucose-Capillary: 105 mg/dL — ABNORMAL HIGH (ref 70–99)
Glucose-Capillary: 180 mg/dL — ABNORMAL HIGH (ref 70–99)
Glucose-Capillary: 111 mg/dL — ABNORMAL HIGH (ref 70–99)
Glucose-Capillary: 126 mg/dL — ABNORMAL HIGH (ref 70–99)

## 2022-05-05 LAB — OCCULT BLOOD, POC DEVICE
Fecal Occult Bld: POSITIVE — AB
Fecal Occult Bld: POSITIVE — AB

## 2022-05-05 LAB — HIV ANTIBODY (ROUTINE TESTING W REFLEX): HIV Screen 4th Generation wRfx: NONREACTIVE

## 2022-05-05 MED ORDER — SODIUM CHLORIDE 0.9% IV SOLUTION: Freq: Once | INTRAVENOUS | Status: AC

## 2022-05-05 MED ORDER — FUROSEMIDE 10 MG/ML IJ SOLN
20.0000 mg | Freq: Once | INTRAMUSCULAR | Status: AC
  Administered 2022-05-05: 20 mg via INTRAVENOUS
  Filled 2022-05-05: qty 2

## 2022-05-05 MED ORDER — DIPHENHYDRAMINE HCL 25 MG PO CAPS
25.0000 mg | ORAL_CAPSULE | Freq: Once | ORAL | Status: AC
  Administered 2022-05-05: 25 mg via ORAL
  Filled 2022-05-05: qty 1

## 2022-05-05 MED ORDER — CARVEDILOL 3.125 MG PO TABS
3.1250 mg | ORAL_TABLET | Freq: Two times a day (BID) | ORAL | Status: DC
  Administered 2022-05-05 (×2): 3.125 mg via ORAL
  Filled 2022-05-05 (×2): qty 1

## 2022-05-05 MED ORDER — SACUBITRIL-VALSARTAN 49-51 MG PO TABS
1.0000 | ORAL_TABLET | Freq: Two times a day (BID) | ORAL | Status: DC
  Administered 2022-05-05 – 2022-05-06 (×3): 1 via ORAL
  Filled 2022-05-05 (×5): qty 1

## 2022-05-05 MED ORDER — ACETAMINOPHEN 325 MG PO TABS
650.0000 mg | ORAL_TABLET | Freq: Once | ORAL | Status: AC
  Administered 2022-05-05: 650 mg via ORAL
  Filled 2022-05-05: qty 2

## 2022-05-05 MED ORDER — GABAPENTIN 100 MG PO CAPS
100.0000 mg | ORAL_CAPSULE | Freq: Every day | ORAL | Status: DC
  Administered 2022-05-05: 100 mg via ORAL
  Filled 2022-05-05: qty 1

## 2022-05-05 MED ORDER — ATORVASTATIN CALCIUM 40 MG PO TABS
40.0000 mg | ORAL_TABLET | Freq: Every day | ORAL | Status: DC
  Administered 2022-05-05 – 2022-05-06 (×2): 40 mg via ORAL
  Filled 2022-05-05 (×2): qty 1

## 2022-05-05 MED ORDER — INSULIN ASPART 100 UNIT/ML IJ SOLN
0.0000 [IU] | Freq: Three times a day (TID) | INTRAMUSCULAR | Status: DC
  Administered 2022-05-05: 2 [IU] via SUBCUTANEOUS

## 2022-05-05 NOTE — Progress Notes (Signed)
PROGRESS NOTE        PATIENT DETAILS Name: Lisa [name] Age: 72 y.o. Sex: female Date of Birth: [birth year] Admit Date: 05/04/2022 Admitting Physician Norval Morton, MD [id number], Scheryl Darter, NP  Brief Summary: Patient is a 72 y.o.  female with history of chronic HFrEF, CVA, HLD, HTN who presented with hematemesis x 3   Significant events: 1/21>> admitted to [facility]-upper GI bleeding with acute blood loss anemia.  Significant studies: 1/21>> CXR: No PNA  Significant microbiology data: None  Procedures: 1/21>> EGD: 5 gastric ulcers-no active bleeding.  Consults: None  Subjective: Had 3 episodes of large-volume hematemesis before she presented to the ED-none since then.  No BM since hospitalization.  Objective: Vitals: Blood pressure (!) 143/55, pulse 72, temperature 98.5 F (36.9 C), temperature source Oral, resp. rate 16, height 5\' 5"  (1.651 m), weight 56.6 kg, SpO2 99 %.   Exam: Gen Exam:Alert awake-not in any distress HEENT:atraumatic, normocephalic Chest: B/L clear to auscultation anteriorly CVS:S1S2 regular Abdomen:soft non tender, non distended Extremities:no edema Neurology: Non focal Skin: no rash  Pertinent Labs/Radiology:    Latest Ref Rng & Units 05/05/2022    2:17 AM 05/04/2022    4:53 PM 05/04/2022   10:55 AM  CBC  WBC 4.0 - 10.5 K/uL 6.6     Hemoglobin 12.0 - 15.0 g/dL 7.4  8.8  9.5   Hematocrit 36.0 - 46.0 % 21.5  26.0  28.0   Platelets 150 - 400 K/uL 140       Lab Results  Component Value Date   NA 138 05/05/2022   K 3.6 05/05/2022   CL 107 05/05/2022   CO2 25 05/05/2022      Assessment/Plan: Upper GI bleeding secondary to gastric ulcers Acute blood loss anemia No further hematemesis since presentation to the ED No melanotic stools overnight Significant drop in hemoglobin-likely reflecting initial loss rather than ongoing blood loss Given significant cardiac issues-will transfuse 1 unit of PRBC GI  recommending PPI twice daily x 6 weeks-and then once daily Plavix to be resumed 48 hours from EGD Advance to full liquids today Repeat CBC later today or tomorrow morning  Chronic HFrEF Euvolemic  Hx of CVA Appears to be nonfocal Holding Plavix-Per GI okay to resume 48 hours from day of EGD  HTN BP currently stable Will resume Coreg/Entresto today Resume amlodipine over the next few days  HLD Resume statin  DM-2 (A1c 5.8 on 1/4) CBGs stable Continue SSI Resume metformin on discharge  BMI: Estimated body mass index is 20.76 kg/m as calculated from the following:   Height as of this encounter: 5\' 5"  (1.651 m).   Weight as of this encounter: 56.6 kg.   Code status:   Code Status: Full Code   DVT Prophylaxis: SCDs Start: 05/04/22 1153   Family Communication: None at bedside   Disposition Plan: Status is: Observation The patient will require care spanning > 2 midnights and should be moved to inpatient because: Severity of illness-significant drop in hemoglobin overnight getting 1 unit of PRBC.   Planned Discharge Destination:Home   Diet: Diet Order             Diet full liquid Room service appropriate? Yes; Fluid consistency: Thin  Diet effective now  Antimicrobial agents: Anti-infectives (From admission, onward)    None        MEDICATIONS: Scheduled Meds:  furosemide  20 mg Intravenous Once   [START ON 05/07/2022] pantoprazole  40 mg Intravenous Q12H   sodium chloride flush  3 mL Intravenous Q12H   Continuous Infusions:  calcium gluconate     pantoprazole 8 mg/hr (05/05/22 0833)   PRN Meds:.acetaminophen **OR** acetaminophen, albuterol, ondansetron **OR** ondansetron (ZOFRAN) IV, mouth rinse   I have personally reviewed following labs and imaging studies  LABORATORY DATA: CBC: Recent Labs  Lab 05/04/22 1018 05/04/22 1055 05/04/22 1653 05/05/22 0217  WBC 8.3  --   --  6.6  NEUTROABS 6.0  --   --   --   HGB  9.7* 9.5* 8.8* 7.4*  HCT 30.4* 28.0* 26.0* 21.5*  MCV 103.1*  --   --  96.8  PLT 178  --   --  140*    Basic Metabolic Panel: Recent Labs  Lab 05/04/22 1018 05/04/22 1055 05/05/22 0217  NA 138 140 138  K 4.6 4.6 3.6  CL 107 106 107  CO2 22  --  25  GLUCOSE 163* 158* 107*  BUN 22 23 22   CREATININE 1.06* 1.10* 0.96  CALCIUM 8.2*  --  8.1*    GFR: Estimated Creatinine Clearance: 48.7 mL/min (by C-G formula based on SCr of 0.96 mg/dL).  Liver Function Tests: Recent Labs  Lab 05/04/22 1018  AST 13*  ALT 7  ALKPHOS 41  BILITOT 0.6  PROT 5.7*  ALBUMIN 3.0*   Recent Labs  Lab 05/04/22 1018  LIPASE 45   No results for input(s): "AMMONIA" in the last 168 hours.  Coagulation Profile: No results for input(s): "INR", "PROTIME" in the last 168 hours.  Cardiac Enzymes: No results for input(s): "CKTOTAL", "CKMB", "CKMBINDEX", "TROPONINI" in the last 168 hours.  BNP (last 3 results) No results for input(s): "PROBNP" in the last 8760 hours.  Lipid Profile: No results for input(s): "CHOL", "HDL", "LDLCALC", "TRIG", "CHOLHDL", "LDLDIRECT" in the last 72 hours.  Thyroid Function Tests: No results for input(s): "TSH", "T4TOTAL", "FREET4", "T3FREE", "THYROIDAB" in the last 72 hours.  Anemia Panel: No results for input(s): "VITAMINB12", "FOLATE", "FERRITIN", "TIBC", "IRON", "RETICCTPCT" in the last 72 hours.  Urine analysis:    Component Value Date/Time   COLORURINE YELLOW 11/18/2018 0910   APPEARANCEUR CLEAR 11/18/2018 0910   LABSPEC 1.009 11/18/2018 0910   PHURINE 5.0 11/18/2018 0910   GLUCOSEU NEGATIVE 11/18/2018 0910   HGBUR SMALL (A) 11/18/2018 0910   BILIRUBINUR NEGATIVE 11/18/2018 0910   BILIRUBINUR NEG 01/20/2013 0846   KETONESUR NEGATIVE 11/18/2018 0910   PROTEINUR NEGATIVE 11/18/2018 0910   UROBILINOGEN 0.2 01/20/2013 0846   UROBILINOGEN 0.2 02/13/2010 0943   NITRITE POSITIVE (A) 11/18/2018 0910   LEUKOCYTESUR TRACE (A) 11/18/2018 0910    Sepsis  Labs: Lactic Acid, Venous No results found for: "LATICACIDVEN"  MICROBIOLOGY: No results found for this or any previous visit (from the past 240 hour(s)).  RADIOLOGY STUDIES/RESULTS: DG CHEST PORT 1 VIEW  Result Date: 05/04/2022 CLINICAL DATA:  GI bleed. EXAM: PORTABLE CHEST 1 VIEW COMPARISON:  02/13/2010 FINDINGS: Normal cardiomediastinal contours. No pleural effusion or edema. No airspace opacities identified. Mild diffuse interstitial reticulation is identified which appears similar to previous exam. The visualized osseous structures are unremarkable. Visualized osseous structures appear. IMPRESSION: 1. No acute cardiopulmonary abnormalities. 2. Interstitial reticulation compatible with chronic interstitial lung disease. Electronically Signed   By: Queen Slough.D.  On: 05/04/2022 13:13     LOS: 0 days   Jeoffrey Massed, MD  [facility]    To contact the attending provider between 7A-7P or the covering provider during after hours 7P-7A, please log into the web site [email or web address] and access using universal [place] password for that web site. If you do not have the password, please call the hospital operator.  05/05/2022, 8:54 AM

## 2022-05-05 NOTE — Plan of Care (Signed)
Pt is alert and oriented times 4. No c/o pain or discomfort. Took all medications and tolerated well. Call in reach. Problem: Education: Goal: Knowledge of General Education information will improve Description: Including pain rating scale, medication(s)/side effects and non-pharmacologic comfort measures Outcome: Progressing   Problem: Health Behavior/Discharge Planning: Goal: Ability to manage health-related needs will improve Outcome: Progressing   Problem: Clinical Measurements: Goal: Ability to maintain clinical measurements within normal limits will improve Outcome: Progressing Goal: Will remain free from infection Outcome: Progressing Goal: Diagnostic test results will improve Outcome: Progressing Goal: Respiratory complications will improve Outcome: Progressing Goal: Cardiovascular complication will be avoided Outcome: Progressing

## 2022-05-05 NOTE — Telephone Encounter (Signed)
-----  Message from Yetta Flock, MD sent at 05/04/2022  4:16 PM EST ----- Regarding: outpatient follow up [facility] can you help coordinate outpatient follow in 4-6 weeks? Thanks

## 2022-05-05 NOTE — Care Management Important Message (Deleted)
Important Message  Patient Details  Name: Lisa [name] MRN: [id number] Date of Birth: 04-18-1950   Medicare Important Message Given:        Levonne Lapping, RN 05/05/2022, 4:00 PM

## 2022-05-05 NOTE — Telephone Encounter (Signed)
Patient has been scheduled for a 5-week hospital follow up with Tye Savoy, NP on Wednesday, 06/11/22 at 10:30 am. Appt information mailed to patient.

## 2022-05-06 ENCOUNTER — Other Ambulatory Visit (HOSPITAL_COMMUNITY): Payer: Self-pay

## 2022-05-06 ENCOUNTER — Encounter (HOSPITAL_COMMUNITY): Payer: Self-pay | Admitting: Gastroenterology

## 2022-05-06 DIAGNOSIS — E785 Hyperlipidemia, unspecified: Secondary | ICD-10-CM | POA: Diagnosis not present

## 2022-05-06 DIAGNOSIS — I5042 Chronic combined systolic (congestive) and diastolic (congestive) heart failure: Secondary | ICD-10-CM | POA: Diagnosis not present

## 2022-05-06 DIAGNOSIS — Z8673 Personal history of transient ischemic attack (TIA), and cerebral infarction without residual deficits: Secondary | ICD-10-CM | POA: Diagnosis not present

## 2022-05-06 DIAGNOSIS — K922 Gastrointestinal hemorrhage, unspecified: Secondary | ICD-10-CM | POA: Diagnosis not present

## 2022-05-06 LAB — TYPE AND SCREEN
ABO/RH(D): B POS
Antibody Screen: NEGATIVE
Unit division: 0

## 2022-05-06 LAB — BPAM RBC
Blood Product Expiration Date: 202402082359
ISSUE DATE / TIME: 202401221121
Unit Type and Rh: 7300

## 2022-05-06 LAB — GLUCOSE, CAPILLARY
Glucose-Capillary: 128 mg/dL — ABNORMAL HIGH (ref 70–99)
Glucose-Capillary: 186 mg/dL — ABNORMAL HIGH (ref 70–99)

## 2022-05-06 MED ORDER — AMLODIPINE BESYLATE 5 MG PO TABS: 5.0000 mg | ORAL_TABLET | Freq: Every day | ORAL | Status: DC

## 2022-05-06 MED ORDER — PANTOPRAZOLE SODIUM 40 MG PO TBEC
40.0000 mg | DELAYED_RELEASE_TABLET | Freq: Once | ORAL | Status: AC
  Administered 2022-05-06: 40 mg via ORAL
  Filled 2022-05-06: qty 1

## 2022-05-06 MED ORDER — CARVEDILOL 12.5 MG PO TABS: 6.2500 mg | ORAL_TABLET | Freq: Two times a day (BID) | ORAL | Status: DC

## 2022-05-06 MED ORDER — CLOPIDOGREL BISULFATE 75 MG PO TABS: 75.0000 mg | ORAL_TABLET | Freq: Every day | ORAL | Status: AC

## 2022-05-06 MED ORDER — PANTOPRAZOLE SODIUM 40 MG PO TBEC
40.0000 mg | DELAYED_RELEASE_TABLET | Freq: Two times a day (BID) | ORAL | 1 refills | Status: DC
  Filled 2022-05-06: qty 60, 30d supply, fill #0

## 2022-05-06 MED ORDER — CARVEDILOL 6.25 MG PO TABS
6.2500 mg | ORAL_TABLET | Freq: Two times a day (BID) | ORAL | Status: DC
  Administered 2022-05-06: 6.25 mg via ORAL
  Filled 2022-05-06: qty 1

## 2022-05-06 NOTE — TOC Transition Note (Signed)
Transition of Care [name]) - CM/SW Discharge Note   Patient Details  Name: Lisa [name] MRN: [id number] Date of Birth: 14-Aug-1950  [name] CM/SW Contact:  Levonne Lapping, RN Phone Number: 05/06/2022, 9:12 AM   Clinical Narrative:     Patient will DC today to home.  There are no recommendations for DME or Home Health. There is a cane in the home should she need. Patients support is her friend, [name] and her Nephew.  [name] will be transportation home today but cannot come until after 3:00 when she is finished with work. CM has spoken to RN and she is aware.   No additional TOC needs        Final next level of care: Home/Self Care Barriers to Discharge: No Barriers Identified   Patient Goals and CMS Choice   Choice offered to / list presented to : NA  Discharge Placement                         Discharge Plan and Services Additional resources added to the After Visit Summary for                  DME Arranged: N/A DME Agency: NA       HH Arranged: NA HH Agency: NA        Social Determinants of Health (SDOH) Interventions SDOH Screenings   Food Insecurity: No Food Insecurity (04/29/2022)  Housing: Low Risk  (04/29/2022)  Transportation Needs: No Transportation Needs (04/29/2022)  Utilities: Not At Risk (04/24/2022)  Alcohol Screen: Low Risk  (04/24/2022)  Depression (PHQ2-9): Low Risk  (04/17/2022)  Financial Resource Strain: Medium Risk (04/24/2022)  Physical Activity: Inactive (04/24/2022)  Social Connections: Moderately Isolated (04/24/2022)  Stress: No Stress Concern Present (04/24/2022)  Tobacco Use: Medium Risk (05/06/2022)     Readmission Risk Interventions     No data to display

## 2022-05-06 NOTE — Care Management Obs Status (Signed)
[place] NOTIFICATION   Patient Details  Name: Lisa [name] MRN: [id number] Date of Birth: 1950-12-27   Medicare Observation Status Notification Given:  Yes    Levonne Lapping, RN 05/06/2022, 8:46 AM

## 2022-05-06 NOTE — Discharge Summary (Signed)
PATIENT DETAILS Name: Lisa [name] Age: 72 y.o. Sex: female Date of Birth: 10/12/1950 MRN: [id number]. Admitting Physician: Norval Morton, MD NWG:[id number], Scheryl Darter, NP  Admit Date: 05/04/2022 Discharge date: 05/06/2022  Recommendations for Outpatient Follow-up:  Follow up with PCP in 1-2 weeks Please obtain CMP/CBC in one week Please ensure follow-up with gastroenterology for H. pylori testing. PPI times twice daily for 6-week, then switch to once daily   Admitted From:  Home  Disposition: Home   Discharge Condition: fair  CODE STATUS:   Code Status: Full Code   Diet recommendation:  Diet Order             DIET SOFT Room service appropriate? Yes; Fluid consistency: Thin  Diet effective now           Diet - low sodium heart healthy                    Brief Summary: Patient is a 72 y.o.  female with history of chronic HFrEF, CVA, HLD, HTN who presented with hematemesis x 3    Significant events: 1/21>> admitted to [facility]-upper GI bleeding with acute blood loss anemia.   Significant studies: 1/21>> CXR: No PNA   Significant microbiology data: None   Procedures: 1/21>> EGD: 5 gastric ulcers-no active bleeding.   Consults: None  Brief Hospital Course: Upper GI bleeding secondary to gastric ulcers Acute blood loss anemia No further hematemesis since presentation to the ED No melanotic stools since admission 1 unit of PRBC transfused-hemoglobin stable at 9.6 (posttransfusion CBC) Per GI recommendation-continue PPI twice daily x 6 weeks, and then once daily Per GI recommendation-Plavix to be resumed on 1/24 Tolerating advancement in diet GI will arrange follow-up and further H. pylori testing. Stable to be discharged today as she has had no GI bleeding in the form of hematemesis/melena since admission. Discussed with patient-she is aware that she is to avoid NSAIDs/Goody powders.   Chronic HFrEF Euvolemic   Hx of CVA Appears to be  nonfocal Holding Plavix-Per GI okay to resume 48 hours from day of EGD Stop aspirin   HTN BP currently stable Tolerating resumption of Coreg/Entresto Resume amlodipine in the next 2 days Hold hydralazine until seen by PCP/cardiology.   HLD Resume statin   DM-2 (A1c 5.8 on 1/4) CBGs stable with SSI Resume metformin on discharge   BMI: Estimated body mass index is 20.76 kg/m as calculated from the following:   Height as of this encounter: 5\' 5"  (1.651 m).   Weight as of this encounter: 56.6 kg.   Discharge Diagnoses:  Principal Problem:   Upper GI bleed Active Problems:   Anemia, posthemorrhagic, acute   Essential hypertension   Chronic combined systolic and diastolic heart failure (HCC)   History of CVA (cerebrovascular accident)   Controlled type 2 diabetes with neuropathy ([place])   Hyperlipidemia   Hematemesis without nausea   Antiplatelet or antithrombotic long-term use   Acute gastric ulcer with hemorrhage   Discharge Instructions:  Activity:  As tolerated   Discharge Instructions     Call MD for:  difficulty breathing, headache or visual disturbances   Complete by: As directed    Call MD for:  redness, tenderness, or signs of infection (pain, swelling, redness, odor or green/yellow discharge around incision site)   Complete by: As directed    Diet - low sodium heart healthy   Complete by: As directed    Discharge instructions   Complete by: As directed  Follow with Primary MD  Gerre Scull, NP in 1-2 weeks  Please get a complete blood count and chemistry panel checked by your Primary MD at your next visit, and again as instructed by your Primary MD.  Get Medicines reviewed and adjusted: Please take all your medications with you for your next visit with your Primary MD  Laboratory/radiological data: Please request your Primary MD to go over all hospital tests and procedure/radiological results at the follow up, please ask your Primary MD to get all  Hospital records sent to his/her office.  In some cases, they will be blood work, cultures and biopsy results pending at the time of your discharge. Please request that your primary care M.D. follows up on these results.  Also Note the following: If you experience worsening of your admission symptoms, develop shortness of breath, life threatening emergency, suicidal or homicidal thoughts you must seek medical attention immediately by calling 911 or calling your MD immediately  if symptoms less severe.  You must read complete instructions/literature along with all the possible adverse reactions/side effects for all the Medicines you take and that have been prescribed to you. Take any new Medicines after you have completely understood and accpet all the possible adverse reactions/side effects.   Do not drive when taking Pain medications or sleeping medications (Benzodaizepines)  Do not take more than prescribed Pain, Sleep and Anxiety Medications. It is not advisable to combine anxiety,sleep and pain medications without talking with your primary care practitioner  Special Instructions: If you have smoked or chewed Tobacco  in the last 2 yrs please stop smoking, stop any regular Alcohol  and or any Recreational drug use.  Wear Seat belts while driving.  Please note: You were cared for by a hospitalist during your hospital stay. Once you are discharged, your primary care physician will handle any further medical issues. Please note that NO REFILLS for any discharge medications will be authorized once you are discharged, as it is imperative that you return to your primary care physician (or establish a relationship with a primary care physician if you do not have one) for your post hospital discharge needs so that they can reassess your need for medications and monitor your lab values.   Increase activity slowly   Complete by: As directed       Allergies as of 05/06/2022       Reactions   Ace  Inhibitors    Elevated Cr for 0.8 to 1.4 (30 percent or greater rise in serum creatinine)    Aspirin Other (See Comments)   Irritates ulcers   Codeine    "upsets myulcers"   Hydrocodone    "upsets my ulcers"        Medication List     STOP taking these medications    aspirin 81 MG chewable tablet   hydrALAZINE 25 MG tablet Commonly known as: APRESOLINE       TAKE these medications    acetaminophen 500 MG tablet Commonly known as: TYLENOL Take 1,000 mg by mouth every 6 (six) hours as needed for moderate pain or mild pain.   amLODipine 5 MG tablet Commonly known as: NORVASC Take 1 tablet (5 mg total) by mouth daily. Start taking on: May 08, 2022 What changed: These instructions start on May 08, 2022. If you are unsure what to do until then, ask your doctor or other care provider.   atorvastatin 40 MG tablet Commonly known as: LIPITOR Take 40 mg by mouth daily.  carvedilol 12.5 MG tablet Commonly known as: COREG Take 0.5 tablets (6.25 mg total) by mouth 2 (two) times daily with a meal. What changed: how much to take   citalopram 20 MG tablet Commonly known as: CELEXA Take 1 tablet (20 mg total) by mouth daily.   clopidogrel 75 MG tablet Commonly known as: PLAVIX Take 1 tablet (75 mg total) by mouth daily. Start taking on: May 07, 2022   diclofenac Sodium 1 % Gel Commonly known as: VOLTAREN APPLY 2 GRAMS TOPICALLY FOUR TIMES DAILY What changed: See the new instructions.   Entresto 49-51 MG Generic drug: sacubitril-valsartan Take 1 tablet by mouth 2 (two) times daily.   gabapentin 100 MG capsule Commonly known as: NEURONTIN Take 1 capsule (100 mg total) by mouth at bedtime.   melatonin 3 MG Tabs tablet TAKE 1 TABLET AT BEDTIME   metFORMIN 500 MG tablet Commonly known as: GLUCOPHAGE Take 250 mg by mouth 2 (two) times daily with a meal.   OneTouch Ultra test strip Generic drug: glucose blood Use as instructed   pantoprazole 40 MG  tablet Commonly known as: Protonix Take 1 tablet (40 mg total) by mouth 2 (two) times daily. Take twice daily x 6 weeks, after 6 weeks-switch to once daily dosing.        Follow-up Information     Charyl Dancer, NP. Schedule an appointment as soon as possible for a visit in 1 week(s).   Specialty: Internal Medicine Contact information: [place] [address] [phone]         Dixie Dials, MD. Schedule an appointment as soon as possible for a visit in 1 week(s).   Specialty: Cardiology Contact information: [place] [address] [phone]         Willia Craze, NP Follow up on 06/11/2022.   Specialty: Gastroenterology Why: appt at 10:30 am Contact information: [place] [address] [phone]                Allergies  Allergen Reactions   Ace Inhibitors     Elevated Cr for 0.8 to 1.4 (30 percent or greater rise in serum creatinine)    Aspirin Other (See Comments)    Irritates ulcers   Codeine     "upsets myulcers"   Hydrocodone     "upsets my ulcers"     Other Procedures/Studies: DG CHEST PORT 1 VIEW  Result Date: 05/04/2022 CLINICAL DATA:  GI bleed. EXAM: PORTABLE CHEST 1 VIEW COMPARISON:  02/13/2010 FINDINGS: Normal cardiomediastinal contours. No pleural effusion or edema. No airspace opacities identified. Mild diffuse interstitial reticulation is identified which appears similar to previous exam. The visualized osseous structures are unremarkable. Visualized osseous structures appear. IMPRESSION: 1. No acute cardiopulmonary abnormalities. 2. Interstitial reticulation compatible with chronic interstitial lung disease. Electronically Signed   By: Kerby Moors M.D.   On: 05/04/2022 13:13     TODAY-DAY OF DISCHARGE:  Subjective:   Ramata Ellwood today has no headache,no chest abdominal pain,no new weakness tingling or numbness, feels much better wants to go home today.    Objective:   Blood pressure (!) 139/58, pulse 82, temperature 98.4 F (36.9 C), temperature source Oral, resp. rate 16, height 5\' 5"  (1.651 m), weight 56.6 kg, SpO2 99 %.  Intake/Output Summary (Last 24 hours) at 05/06/2022 0837 Last data filed at 05/05/2022 1508 Gross per 24 hour  Intake 525.16 ml  Output --  Net 525.16 ml   Danley Danker  Weights   05/04/22 1915 05/05/22 0435  Weight: 57.1 kg 56.6 kg    Exam: Awake Alert, Oriented *3, No new F.N deficits, Normal affect [place].AT,PERRAL Supple Neck,No JVD, No cervical lymphadenopathy appriciated.  Symmetrical Chest wall movement, Good air movement bilaterally, CTAB RRR,No Gallops,Rubs or new Murmurs, No Parasternal Heave +ve B.Sounds, Abd Soft, Non tender, No organomegaly appriciated, No rebound -guarding or rigidity. No Cyanosis, Clubbing or edema, No new Rash or bruise   PERTINENT RADIOLOGIC STUDIES: DG CHEST PORT 1 VIEW  Result Date: 05/04/2022 CLINICAL DATA:  GI bleed. EXAM: PORTABLE CHEST 1 VIEW COMPARISON:  02/13/2010 FINDINGS: Normal cardiomediastinal contours. No pleural effusion or edema. No airspace opacities identified. Mild diffuse interstitial reticulation is identified which appears similar to previous exam. The visualized osseous structures are unremarkable. Visualized osseous structures appear. IMPRESSION: 1. No acute cardiopulmonary abnormalities. 2. Interstitial reticulation compatible with chronic interstitial lung disease. Electronically Signed   By: Signa Kell M.D.   On: 05/04/2022 13:13     PERTINENT LAB RESULTS: CBC: Recent Labs    05/05/22 0217 05/05/22 1657  WBC 6.6 6.5  HGB 7.4* 9.6*  HCT 21.5* 27.4*  PLT 140* 147*   CMET CMP     Component Value Date/Time   NA 138 05/05/2022 0217   NA 139 06/20/2020 1103   K 3.6 05/05/2022 0217   CL 107 05/05/2022 0217   CO2 25 05/05/2022 0217   GLUCOSE 107 (H) 05/05/2022 0217   BUN 22 05/05/2022 0217   BUN 15 06/20/2020 1103   CREATININE 0.96 05/05/2022  0217   CREATININE 0.80 01/20/2013 0936   CALCIUM 8.1 (L) 05/05/2022 0217   PROT 5.7 (L) 05/04/2022 1018   ALBUMIN 3.0 (L) 05/04/2022 1018   AST 13 (L) 05/04/2022 1018   ALT 7 05/04/2022 1018   ALKPHOS 41 05/04/2022 1018   BILITOT 0.6 05/04/2022 1018   GFRNONAA >60 05/05/2022 0217   GFRAA >60 11/20/2018 0402    GFR Estimated Creatinine Clearance: 48.7 mL/min (by C-G formula based on SCr of 0.96 mg/dL). Recent Labs    05/04/22 1018  LIPASE 45   No results for input(s): "CKTOTAL", "CKMB", "CKMBINDEX", "TROPONINI" in the last 72 hours. Invalid input(s): "POCBNP" No results for input(s): "DDIMER" in the last 72 hours. No results for input(s): "HGBA1C" in the last 72 hours. No results for input(s): "CHOL", "HDL", "LDLCALC", "TRIG", "CHOLHDL", "LDLDIRECT" in the last 72 hours. No results for input(s): "TSH", "T4TOTAL", "T3FREE", "THYROIDAB" in the last 72 hours.  Invalid input(s): "FREET3" No results for input(s): "VITAMINB12", "FOLATE", "FERRITIN", "TIBC", "IRON", "RETICCTPCT" in the last 72 hours. Coags: No results for input(s): "INR" in the last 72 hours.  Invalid input(s): "PT" Microbiology: No results found for this or any previous visit (from the past 240 hour(s)).  FURTHER DISCHARGE INSTRUCTIONS:  Get Medicines reviewed and adjusted: Please take all your medications with you for your next visit with your Primary MD  Laboratory/radiological data: Please request your Primary MD to go over all hospital tests and procedure/radiological results at the follow up, please ask your Primary MD to get all Hospital records sent to his/her office.  In some cases, they will be blood work, cultures and biopsy results pending at the time of your discharge. Please request that your primary care M.D. goes through all the records of your hospital data and follows up on these results.  Also Note the following: If you experience worsening of your admission symptoms, develop shortness of  breath, life threatening emergency, suicidal or  homicidal thoughts you must seek medical attention immediately by calling 911 or calling your MD immediately  if symptoms less severe.  You must read complete instructions/literature along with all the possible adverse reactions/side effects for all the Medicines you take and that have been prescribed to you. Take any new Medicines after you have completely understood and accpet all the possible adverse reactions/side effects.   Do not drive when taking Pain medications or sleeping medications (Benzodaizepines)  Do not take more than prescribed Pain, Sleep and Anxiety Medications. It is not advisable to combine anxiety,sleep and pain medications without talking with your primary care practitioner  Special Instructions: If you have smoked or chewed Tobacco  in the last 2 yrs please stop smoking, stop any regular Alcohol  and or any Recreational drug use.  Wear Seat belts while driving.  Please note: You were cared for by a hospitalist during your hospital stay. Once you are discharged, your primary care physician will handle any further medical issues. Please note that NO REFILLS for any discharge medications will be authorized once you are discharged, as it is imperative that you return to your primary care physician (or establish a relationship with a primary care physician if you do not have one) for your post hospital discharge needs so that they can reassess your need for medications and monitor your lab values.  Total Time spent coordinating discharge including counseling, education and face to face time equals greater than 30 minutes.  SignedJeoffrey Massed 05/06/2022 8:37 AM

## 2022-05-06 NOTE — Progress Notes (Signed)
Discharge paperwork have been reviewed with pt by Delicia RN. Pt's daughter will transport pt home after she gets off work at [place] does not have clothing here and daughter will bring pt clothing when she picks her up for discharge.

## 2022-05-07 ENCOUNTER — Telehealth: Payer: Self-pay

## 2022-05-07 NOTE — Telephone Encounter (Signed)
Noted  

## 2022-05-07 NOTE — Telephone Encounter (Signed)
Transition Care Management Follow-up Telephone Call Date of discharge and from where: [facility] 05-06-22 Dx: Upper GI bleed How have you been since you were released from the hospital? Doing ok  Any questions or concerns? No  Items Reviewed: Did the pt receive and understand the discharge instructions provided? Yes  Medications obtained and verified? Yes  Other? No  Any new allergies since your discharge? No  Dietary orders reviewed? Yes Do you have support at home? Yes   Home Care and Equipment/Supplies: Were home health services ordered? no If so, what is the name of the agency? na  Has the agency set up a time to come to the patient's home? not applicable Were any new equipment or medical supplies ordered?  No What is the name of the medical supply agency? na Were you able to get the supplies/equipment? not applicable Do you have any questions related to the use of the equipment or supplies? No  Functional Questionnaire: (I = Independent and D = Dependent) ADLs: I  Bathing/Dressing- I  Meal Prep- I  Eating- I  Maintaining continence- I  Transferring/Ambulation- I  Managing Meds- I  Follow up appointments reviewed:  PCP Hospital f/u appt confirmed? Yes  [facility]  Scheduled to see Dr Chester Holstein NP on 06-11-21 @ 1030am and Dr Doylene Canard on 05-28-22 at 10am. Are transportation arrangements needed? No  If their condition worsens, is the pt aware to call PCP or go to the Emergency Dept.? Yes Was the patient provided with contact information for the PCP's office or ED? Yes Was to pt encouraged to call back with questions or concerns? Yes   Juanda Crumble LPN [facility] [phone]

## 2022-05-08 ENCOUNTER — Emergency Department (HOSPITAL_COMMUNITY)
Admission: EM | Admit: 2022-05-08 | Discharge: 2022-05-09 | Discharge status: Against Medical Advice | Payer: Medicare PPO | Attending: Emergency Medicine | Admitting: Emergency Medicine

## 2022-05-08 ENCOUNTER — Telehealth: Payer: Self-pay

## 2022-05-08 ENCOUNTER — Telehealth: Payer: Self-pay | Admitting: Pharmacy Technician

## 2022-05-08 ENCOUNTER — Telehealth: Payer: Self-pay | Admitting: Nurse Practitioner

## 2022-05-08 ENCOUNTER — Other Ambulatory Visit (HOSPITAL_COMMUNITY): Payer: Self-pay

## 2022-05-08 ENCOUNTER — Encounter (HOSPITAL_COMMUNITY): Payer: Self-pay | Admitting: Emergency Medicine

## 2022-05-08 DIAGNOSIS — R1032 Left lower quadrant pain: Secondary | ICD-10-CM | POA: Diagnosis not present

## 2022-05-08 DIAGNOSIS — Z5321 Procedure and treatment not carried out due to patient leaving prior to being seen by health care provider: Secondary | ICD-10-CM | POA: Diagnosis not present

## 2022-05-08 DIAGNOSIS — R42 Dizziness and giddiness: Secondary | ICD-10-CM | POA: Insufficient documentation

## 2022-05-08 LAB — CBC WITH DIFFERENTIAL/PLATELET
Abs Immature Granulocytes: 0.02 10*3/uL (ref 0.00–0.07)
Basophils Absolute: 0 10*3/uL (ref 0.0–0.1)
Basophils Relative: 1 %
Eosinophils Absolute: 0.1 10*3/uL (ref 0.0–0.5)
Eosinophils Relative: 1 %
HCT: 27.6 % — ABNORMAL LOW (ref 36.0–46.0)
Hemoglobin: 9.4 g/dL — ABNORMAL LOW (ref 12.0–15.0)
Immature Granulocytes: 0 %
Lymphocytes Relative: 26 %
Lymphs Abs: 1.4 10*3/uL (ref 0.7–4.0)
MCH: 32.5 pg (ref 26.0–34.0)
MCHC: 34.1 g/dL (ref 30.0–36.0)
MCV: 95.5 fL (ref 80.0–100.0)
Monocytes Absolute: 0.4 10*3/uL (ref 0.1–1.0)
Monocytes Relative: 7 %
Neutro Abs: 3.5 10*3/uL (ref 1.7–7.7)
Neutrophils Relative %: 65 %
Platelets: 169 10*3/uL (ref 150–400)
RBC: 2.89 MIL/uL — ABNORMAL LOW (ref 3.87–5.11)
RDW: 15.3 % (ref 11.5–15.5)
WBC: 5.4 10*3/uL (ref 4.0–10.5)
nRBC: 0 % (ref 0.0–0.2)

## 2022-05-08 LAB — COMPREHENSIVE METABOLIC PANEL
ALT: 10 U/L (ref 0–44)
AST: 16 U/L (ref 15–41)
Albumin: 3.5 g/dL (ref 3.5–5.0)
Alkaline Phosphatase: 50 U/L (ref 38–126)
Anion gap: 7 (ref 5–15)
BUN: 6 mg/dL — ABNORMAL LOW (ref 8–23)
CO2: 27 mmol/L (ref 22–32)
Calcium: 8.6 mg/dL — ABNORMAL LOW (ref 8.9–10.3)
Chloride: 104 mmol/L (ref 98–111)
Creatinine, Ser: 1 mg/dL (ref 0.44–1.00)
GFR, Estimated: >60 mL/min (ref >60)
Glucose, Bld: 156 mg/dL — ABNORMAL HIGH (ref 70–99)
Potassium: 3.8 mmol/L (ref 3.5–5.1)
Sodium: 138 mmol/L (ref 135–145)
Total Bilirubin: 0.5 mg/dL (ref 0.3–1.2)
Total Protein: 6.5 g/dL (ref 6.5–8.1)

## 2022-05-08 LAB — LIPASE, BLOOD: Lipase: 35 U/L (ref 11–51)

## 2022-05-08 NOTE — ED Triage Notes (Signed)
Pt reports dizziness since Tuesday when she was in the hospital. Pt reports she took her BP and it was 144 systolic and then 315 systolic so sent by PCP. Ambulatory with no deficits noted. Pt hypertensive, reports she did not take meds today.

## 2022-05-08 NOTE — Telephone Encounter (Addendum)
Transition Care Management Follow-up Telephone Call Date of discharge and from where: 05/09/22 [facility] ED. Dx: Dizziness How have you been since you were released from the hospital? She is back at the ED Any questions or concerns? No  Items Reviewed: Did the pt receive and understand the discharge instructions provided? Yes  Medications obtained and verified? Yes  Other? No  Any new allergies since your discharge? No  Dietary orders reviewed? No Do you have support at home? Yes    Follow up appointments reviewed:  PCP Hospital f/u appt confirmed? Yes Scheduled to see Vance Peper on 05/13/22 @ 11:20am.  [facility] f/u appt confirmed? No  Scheduled to see - on - @ -. Are transportation arrangements needed? No  If their condition worsens, is the pt aware to call PCP or go to the Emergency Dept.? Yes Was the patient provided with contact information for the PCP's office or ED? Yes Was to pt encouraged to call back with questions or concerns? Yes  Angeline Slim, RN, BSN RN Clinical Supervisor [facility] [employer]

## 2022-05-08 NOTE — Telephone Encounter (Signed)
FYI:Pt was released from the hosp for Upper GI bleed  Principal problem 05/04/2022 - 05/06/2022 (2 days) [facility],  she does have a hosp f/up scheduled for 05/13/22 with [name]. Today 05/08/22 she has called in stating she is feeling dizzy and her bp has dropped to 109/53, I transferred her over to nurse triage.

## 2022-05-08 NOTE — ED Provider Triage Note (Signed)
Emergency Medicine Provider Triage Evaluation Note  Lisa [name] , a 72 y.o. female  was evaluated in triage.  Pt complains of dizziness since Tuesday this week.  Patient recently discharged from hospital.  She reports having an endoscopy and she was found to have stomach ulcers.  Denies fever or vomiting.  Endorses intermittent left lower quadrant abdominal pain.  No bowel changes or urinary symptoms.  Review of Systems  Positive: As above Negative: As above  Physical Exam  BP (!) 181/97 (BP Location: Right Arm)   Pulse 83   Temp 97.9 F (36.6 C) (Oral)   Resp 16   Ht 5\' 5"  (1.651 m)   Wt 55.8 kg   SpO2 100%   BMI 20.47 kg/m  Gen:   Awake, no distress   Resp:  Normal effort  MSK:   Moves extremities without difficulty  Other:    Medical Decision Making  Medically screening exam initiated at 2:58 PM.  Appropriate orders placed.  Lisa [name] was informed that the remainder of the evaluation will be completed by another provider, this initial triage assessment does not replace that evaluation, and the importance of remaining in the ED until their evaluation is complete.     Lisa Kras, PA 05/08/22 1500

## 2022-05-08 NOTE — Telephone Encounter (Signed)
Received PA request for One touch ultra supplies- they are not preferred by plan:

## 2022-05-08 NOTE — Telephone Encounter (Signed)
Noted, needs to go to urgent care or ER.

## 2022-05-09 MED ORDER — ACCU-CHEK AVIVA PLUS W/DEVICE KIT: 1.0000 | PACK | Freq: Every day | 0 refills | Status: DC

## 2022-05-09 NOTE — Progress Notes (Deleted)
Established Patient Office Visit  Subjective   Patient ID: Lisa [name], female    DOB: 03/31/1951  Age: 72 y.o. MRN: [id number]  No chief complaint on file.   HPI  Lisa [name] is here to follow-up after hospitalization for GI bleed. She had an EGD which showed 5 gastric ulcers.   Transition of Care Hospital Follow up.   Hospital/Facility: [facility] D/C Physician: Oren Binet, MD D/C Date: 05/06/22  Records Requested: 05/09/22 Records Received: 05/09/22 Records Reviewed: 05/09/22  Diagnoses on Discharge: Upper GI bleed, Chronic HFrEF, history of CVA, HTN, DM  Date of interactive Contact within 48 hours of discharge: 05/07/22 Contact was through: phone  Date of 7 day or 14 day face-to-face visit:    within 7 days  Outpatient Encounter Medications as of 05/13/2022  Medication Sig   acetaminophen (TYLENOL) 500 MG tablet Take 1,000 mg by mouth every 6 (six) hours as needed for moderate pain or mild pain.   amLODipine (NORVASC) 5 MG tablet Take 1 tablet (5 mg total) by mouth daily.   atorvastatin (LIPITOR) 40 MG tablet Take 40 mg by mouth daily.   Blood Glucose Monitoring Suppl (ACCU-CHEK AVIVA PLUS) w/Device KIT 1 each by Does not apply route daily.   carvedilol (COREG) 12.5 MG tablet Take 0.5 tablets (6.25 mg total) by mouth 2 (two) times daily with a meal.   citalopram (CELEXA) 20 MG tablet Take 1 tablet (20 mg total) by mouth daily.   clopidogrel (PLAVIX) 75 MG tablet Take 1 tablet (75 mg total) by mouth daily.   diclofenac Sodium (VOLTAREN) 1 % GEL APPLY 2 GRAMS TOPICALLY FOUR TIMES DAILY (Patient taking differently: Apply 2 g topically 4 (four) times daily as needed (pain).)   gabapentin (NEURONTIN) 100 MG capsule Take 1 capsule (100 mg total) by mouth at bedtime.   glucose blood (ONETOUCH ULTRA) test strip Use as instructed   melatonin 3 MG TABS tablet TAKE 1 TABLET AT BEDTIME   metFORMIN (GLUCOPHAGE) 500 MG tablet Take 250 mg by mouth 2 (two) times  daily with a meal.   pantoprazole (PROTONIX) 40 MG tablet Take 1 tablet (40 mg) twice daily for 6 weeks, THEN take 1 tablet (40 mg total) once daily thereafter.   sacubitril-valsartan (ENTRESTO) 49-51 MG Take 1 tablet by mouth 2 (two) times daily.   No facility-administered encounter medications on file as of 05/13/2022.    Diagnostic Tests Reviewed/Disposition: Reviewed on chart  Consults: GI  Discharge Instructions:  Follow up with PCP in 1-2 weeks Please obtain CMP/CBC in one week Please ensure follow-up with gastroenterology for H. pylori testing. PPI times twice daily for 6-week, then switch to once daily Soft diet  Disease/illness Education: Discussed with patient  Home Health/Community Services Discussions/Referrals: N/A  Establishment or re-establishment of referral orders for community resources: N/A  Discussion with other health care providers: Reviewed notes on chart  Assessment and Support of treatment regimen adherence:  Appointments Coordinated with: Has appointment with GI 06/11/22  Education for self-management, independent living, and ADLs: Discussed with patient   {History (Optional):23778}  ROS    Objective:     There were no vitals taken for this visit. {Vitals History (Optional):23777}  Physical Exam   No results found for any visits on 05/13/22.  {Labs (Optional):23779}  The 10-year ASCVD risk score (Arnett DK, et al., 2019) is: 29.4%    Assessment & Plan:   Problem List Items Addressed This Visit   None   No follow-ups  on file.    Lisa Dancer, NP

## 2022-05-09 NOTE — ED Notes (Signed)
Pt seen leaving the ED

## 2022-05-09 NOTE — Telephone Encounter (Signed)
Called and lvm for patient informing her that we have sent in rx for meter and supplies that her insurance will cover.

## 2022-05-13 ENCOUNTER — Inpatient Hospital Stay: Payer: Medicare PPO | Admitting: Nurse Practitioner

## 2022-05-13 ENCOUNTER — Telehealth: Payer: Self-pay | Admitting: Nurse Practitioner

## 2022-05-13 DIAGNOSIS — K922 Gastrointestinal hemorrhage, unspecified: Secondary | ICD-10-CM

## 2022-05-13 NOTE — Telephone Encounter (Signed)
Pt was a no show for a hosp f/up with [name] on 05/13/22, I sent a letter.

## 2022-05-14 ENCOUNTER — Ambulatory Visit: Payer: Self-pay

## 2022-05-14 ENCOUNTER — Telehealth: Payer: Medicare PPO

## 2022-05-14 DIAGNOSIS — I10 Essential (primary) hypertension: Secondary | ICD-10-CM

## 2022-05-14 DIAGNOSIS — E1140 Type 2 diabetes mellitus with diabetic neuropathy, unspecified: Secondary | ICD-10-CM

## 2022-05-14 DIAGNOSIS — F320 Major depressive disorder, single episode, mild: Secondary | ICD-10-CM

## 2022-05-14 NOTE — Chronic Care Management (AMB) (Signed)
Chronic Care Management   CCM RN Visit Note  05/14/2022 Name: Lisa [name] MRN: [id number] DOB: 1950/12/11  Subjective: Lisa [name] is a 72 y.o. year old female who is a primary care patient of [name], [name], NP. The patient was referred to the Chronic Care Management team for assistance with care management needs subsequent to provider initiation of CCM services and plan of care.    Today's Visit:  Engaged with patient by telephone for follow up visit.        Goals Addressed             This Visit's Progress    CCM Expected Outcome:  Monitor, Self-Manage and Reduce Symptoms of Diabetes       Current Barriers:  Knowledge Deficits related to the need to check blood sugars on a regular basis, preventive care needs, and importance of following recommendations by the provider for effective management of DM and other chronic conditions Care Coordination needs related to resources the patient needs for senior housing so she can have a safe place to live without fear of being asked to move out in a patient with DM Chronic Disease Management support and education needs related to effective management of DM Lacks caregiver support.  Film/video editor.  Lab Results  Component Value Date   HGBA1C 5.8 04/17/2022     Planned Interventions: Provided education to patient about basic DM disease process. Review and education. The patient has multiple chronic conditions impacting her care. Her DM are stable. Discussed changes to monitor for and when to call the provider.; Reviewed medications with patient and discussed importance of medication adherence. 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 Denies any swelling. States compliance with medications. Has had changes since her hospitalization. States she has her medications and denies any new needs at this time;        Reviewed prescribed diet  with patient heart healthy/ADA diet. Education and support given ; Counseled on importance of regular laboratory monitoring as prescribed. Has regular lab testing;        Discussed plans with patient for ongoing care management follow up and provided patient with direct contact information for care management team;      Provided patient with written educational materials related to hypo and hyperglycemia and importance of correct treatment. Denies any drops in her blood sugars or highs. Will continue to monitor;       Reviewed scheduled/upcoming provider appointments including: 05-22-2022 ;         Referral made to social work team for assistance with resources needed in the community for senior housing. In basket message sent to the scheduler for SW support scheduling. SW has spoke to the patient and resources provided. The patient knows to let the [facility] know of new needs or concerns;      Review of patient status, including review of consultants reports, relevant laboratory and other test results, and medications completed;       Advised patient to discuss changes in her DM, Questions, and concerns with provider;      Screening for signs and symptoms of depression related to chronic disease state;        Assessed social determinant of health barriers;         Symptom Management: Take medications as prescribed   Attend all scheduled provider appointments Call provider office for new concerns or questions  call the Suicide and Crisis Lifeline: 988 call the [place] National Suicide Prevention Lifeline: 513-684-6753 or TTY: 330-316-2605 TTY (314)534-9821) to talk to a trained counselor call 1-800-273-TALK (toll free, 24 hour hotline) if experiencing a Mental Health or [place]  schedule appointment with eye doctor check feet daily for cuts, sores or redness trim toenails straight across wash and dry feet carefully every day wear comfortable, cotton socks wear comfortable,  well-fitting shoes  Follow Up Plan: Telephone follow up appointment with care management team member scheduled for: 05-30-2022 at 0945 am       CCM Expected Outcome:  Monitor, Self-Manage, and Reduce Symptoms of Hypertension       Current Barriers:  Knowledge Deficits related to the benefits of taking blood pressures on a regular basis to monitor changes in blood pressures and decrease risk of heart attack and stroke Care Coordination needs related to educational needs for a patient with HTN and other chronic conditions and resources in the community to help patient with housing needs  in a patient with HTN Chronic Disease Management support and education needs related to effective management of HTN Lacks caregiver support.  Needs stable housing, currently lives with her cousin but needs to find a place of her own. Asking for help with resources  BP Readings from Last 3 Encounters:  05/08/22 (!) 152/85  05/06/22 (!) 130/57  04/17/22 (!) 150/80     Planned Interventions: Evaluation of current treatment plan related to hypertension self management and patient's adherence to plan as established by provider.The patient was recently in the hospital due to a GI bleed. She has several ulcers. She was in the hospital from 1-21 to 05-06-2022. She also went back to the ED on 05-08-2022 for dizziness. She states that she thought she had bounced back and kind of overdid it and she learned that she could not do that. She says that her husbands cousin has been good to her and very kind and she told her not to worry about having a place to stay. This makes her feel better, but she still desires to get a place of her own. She is focusing on fully recovering and she is following the recommendations of the provider. Blood pressures have been a little elevated and she feels this is due to the stress she has been under. She feels much better now and is happy to be home.  Provided education to patient re: stroke  prevention, s/s of heart attack and stroke. The patient has a history of stroke and the patient mindful of the impact of uncontrolled blood pressures and risk of HA and stroke; Reviewed prescribed diet heart healthy/ADA diet- the patient states she is mindful of her dietary restrictions and she monitors her sodium and sugar intake. She also is monitoring for spicy foods now and knows she needs to watch her intake closely due to the recent hospitalization with finding ulcers present.  Reviewed medications with patient and discussed importance of compliance. The patient states that she does have her medications and she is taking her medications as directed. Has her medications and is compliant with medications. Is not taking pantoparazole 40 mg. States she has had some nausea post  hospitalization but it is better. Review of calling the provider for acute changes.  Discussed plans with patient for ongoing care management follow up and provided patient with direct contact information for care management team; Advised patient, providing education and rationale, to monitor blood pressure daily and record, calling PCP  for findings outside established parameters;  Reviewed scheduled/upcoming provider appointments including: 05-22-2022 with the pcp Advised patient to discuss changes in her blood pressures or questions/Concerns about her heart health with provider; Provided education on prescribed diet heart healthy/ADA diet. Review and education. Also discussed avoiding spicy foods that could aggravate the presence of ulcers ;  Discussed complications of poorly controlled blood pressure such as heart disease, stroke, circulatory complications, vision complications, kidney impairment, sexual dysfunction;  Screening for signs and symptoms of depression related to chronic disease state;  Assessed social determinant of health barriers;   Symptom Management: Take medications as prescribed   Attend all scheduled  provider appointments Call provider office for new concerns or questions  call the Suicide and Crisis Lifeline: 988 call the [place] National Suicide Prevention Lifeline: 540-397-0648 or TTY: (670)140-4009 TTY (825)838-1052) to talk to a trained counselor call 1-800-273-TALK (toll free, 24 hour hotline) if experiencing a Mental Health or [place]  check blood pressure weekly learn about high blood pressure call doctor for signs and symptoms of high blood pressure develop an action plan for high blood pressure keep all doctor appointments take medications for blood pressure exactly as prescribed report new symptoms to your doctor  Follow Up Plan: Telephone follow up appointment with care management team member scheduled for: 05-30-2022 at 0945 am          Plan:Telephone follow up appointment with care management team member scheduled for:  05-30-2022 at 0945 am  Noreene Larsson RN, MSN, CCM RN Care Manager  Chronic Care Management Direct Number: [phone]

## 2022-05-14 NOTE — Patient Instructions (Signed)
Please call the care guide team at [phone] if you need to cancel or reschedule your appointment.   If you are experiencing a Mental Health or [place] or need someone to talk to, please call the Suicide and Crisis Lifeline: 988 call the [place] National Suicide Prevention Lifeline: 425 651 9934 or TTY: 331-262-0935 TTY (667)744-1212) to talk to a trained counselor call 1-800-273-TALK (toll free, 24 hour hotline)   Following is a copy of the CCM Program Consent:  CCM service includes personalized support from designated clinical staff supervised by the physician, including individualized plan of care and coordination with other care providers 24/7 contact phone numbers for assistance for urgent and routine care needs. Service will only be billed when office clinical staff spend 20 minutes or more in a month to coordinate care. Only one practitioner may furnish and bill the service in a calendar month. The patient may stop CCM services at amy time (effective at the end of the month) by phone call to the office staff. The patient will be responsible for cost sharing (co-pay) or up to 20% of the service fee (after annual deductible is met)  Following is a copy of your full provider care plan:   Goals Addressed             This Visit's Progress    CCM Expected Outcome:  Monitor, Self-Manage and Reduce Symptoms of Diabetes       Current Barriers:  Knowledge Deficits related to the need to check blood sugars on a regular basis, preventive care needs, and importance of following recommendations by the provider for effective management of DM and other chronic conditions Care Coordination needs related to resources the patient needs for senior housing so she can have a safe place to live without fear of being asked to move out in a patient with DM Chronic Disease Management support and education needs related to effective management of DM Lacks caregiver support.  Museum/gallery exhibitions officer.  Lab Results  Component Value Date   HGBA1C 5.8 04/17/2022     Planned Interventions: Provided education to patient about basic DM disease process. Review and education. The patient has multiple chronic conditions impacting her care. Her DM are stable. Discussed changes to monitor for and when to call the provider.; Reviewed medications with patient and discussed importance of medication adherence. 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 Denies any swelling. States compliance with medications. Has had changes since her hospitalization. States she has her medications and denies any new needs at this time;        Reviewed prescribed diet with patient heart healthy/ADA diet. Education and support given ; Counseled on importance of regular laboratory monitoring as prescribed. Has regular lab testing;        Discussed plans with patient for ongoing care management follow up and provided patient with direct contact information for care management team;      Provided patient with written educational materials related to hypo and hyperglycemia and importance of correct treatment. Denies any drops in her blood sugars or highs. Will continue to monitor;       Reviewed scheduled/upcoming provider appointments including: 05-22-2022 ;         Referral made to social work team for assistance with resources needed in the community for senior housing. In basket message sent to the scheduler for SW support scheduling. SW has spoke to the  patient and resources provided. The patient knows to let the [facility] know of new needs or concerns;      Review of patient status, including review of consultants reports, relevant laboratory and other test results, and medications completed;       Advised patient to discuss changes in her DM, Questions, and concerns with provider;      Screening for signs and symptoms of depression  related to chronic disease state;        Assessed social determinant of health barriers;         Symptom Management: Take medications as prescribed   Attend all scheduled provider appointments Call provider office for new concerns or questions  call the Suicide and Crisis Lifeline: 988 call the [place] National Suicide Prevention Lifeline: (706)499-0677 or TTY: 609-376-5495 TTY 223-236-3824) to talk to a trained counselor call 1-800-273-TALK (toll free, 24 hour hotline) if experiencing a Mental Health or [place]  schedule appointment with eye doctor check feet daily for cuts, sores or redness trim toenails straight across wash and dry feet carefully every day wear comfortable, cotton socks wear comfortable, well-fitting shoes  Follow Up Plan: Telephone follow up appointment with care management team member scheduled for: 05-30-2022 at 0945 am       CCM Expected Outcome:  Monitor, Self-Manage, and Reduce Symptoms of Hypertension       Current Barriers:  Knowledge Deficits related to the benefits of taking blood pressures on a regular basis to monitor changes in blood pressures and decrease risk of heart attack and stroke Care Coordination needs related to educational needs for a patient with HTN and other chronic conditions and resources in the community to help patient with housing needs  in a patient with HTN Chronic Disease Management support and education needs related to effective management of HTN Lacks caregiver support.  Needs stable housing, currently lives with her cousin but needs to find a place of her own. Asking for help with resources  BP Readings from Last 3 Encounters:  05/08/22 (!) 152/85  05/06/22 (!) 130/57  04/17/22 (!) 150/80     Planned Interventions: Evaluation of current treatment plan related to hypertension self management and patient's adherence to plan as established by provider.The patient was recently in the hospital due to a GI bleed.  She has several ulcers. She was in the hospital from 1-21 to 05-06-2022. She also went back to the ED on 05-08-2022 for dizziness. She states that she thought she had bounced back and kind of overdid it and she learned that she could not do that. She says that her husbands cousin has been good to her and very kind and she told her not to worry about having a place to stay. This makes her feel better, but she still desires to get a place of her own. She is focusing on fully recovering and she is following the recommendations of the provider. Blood pressures have been a little elevated and she feels this is due to the stress she has been under. She feels much better now and is happy to be home.  Provided education to patient re: stroke prevention, s/s of heart attack and stroke. The patient has a history of stroke and the patient mindful of the impact of uncontrolled blood pressures and risk of HA and stroke; Reviewed prescribed diet heart healthy/ADA diet- the patient states she is mindful of her dietary restrictions and she monitors her sodium and sugar intake. She also is monitoring for spicy  foods now and knows she needs to watch her intake closely due to the recent hospitalization with finding ulcers present.  Reviewed medications with patient and discussed importance of compliance. The patient states that she does have her medications and she is taking her medications as directed. Has her medications and is compliant with medications. Is not taking pantoparazole 40 mg. States she has had some nausea post  hospitalization but it is better. Review of calling the provider for acute changes.  Discussed plans with patient for ongoing care management follow up and provided patient with direct contact information for care management team; Advised patient, providing education and rationale, to monitor blood pressure daily and record, calling PCP for findings outside established parameters;  Reviewed  scheduled/upcoming provider appointments including: 05-22-2022 with the pcp Advised patient to discuss changes in her blood pressures or questions/Concerns about her heart health with provider; Provided education on prescribed diet heart healthy/ADA diet. Review and education. Also discussed avoiding spicy foods that could aggravate the presence of ulcers ;  Discussed complications of poorly controlled blood pressure such as heart disease, stroke, circulatory complications, vision complications, kidney impairment, sexual dysfunction;  Screening for signs and symptoms of depression related to chronic disease state;  Assessed social determinant of health barriers;   Symptom Management: Take medications as prescribed   Attend all scheduled provider appointments Call provider office for new concerns or questions  call the Suicide and Crisis Lifeline: 988 call the [place] National Suicide Prevention Lifeline: 367-752-9523 or TTY: 908-427-5462 TTY (442) 750-6950) to talk to a trained counselor call 1-800-273-TALK (toll free, 24 hour hotline) if experiencing a Mental Health or [place]  check blood pressure weekly learn about high blood pressure call doctor for signs and symptoms of high blood pressure develop an action plan for high blood pressure keep all doctor appointments take medications for blood pressure exactly as prescribed report new symptoms to your doctor  Follow Up Plan: Telephone follow up appointment with care management team member scheduled for: 05-30-2022 at 0945 am          The patient verbalized understanding of instructions, educational materials, and care plan provided today and DECLINED offer to receive copy of patient instructions, educational materials, and care plan.   Telephone follow up appointment with care management team member scheduled for: 05-30-2022 at 0945 am

## 2022-05-16 NOTE — Telephone Encounter (Signed)
1st no show, pt has rescheduled, no fee, letter sent

## 2022-05-19 MED ORDER — ACCU-CHEK AVIVA PLUS VI STRP: ORAL_STRIP | 12 refills | Status: AC

## 2022-05-19 MED ORDER — ACCU-CHEK SOFTCLIX LANCETS MISC: 12 refills | Status: AC

## 2022-05-19 NOTE — Telephone Encounter (Signed)
Caller Name: [facility] w/[facility]  Reason for Call: received RX for AccuChek meter. Needing RX for lancets and test strips. Please include quantity to dispense, frequency to test, and # of refills.

## 2022-05-19 NOTE — Telephone Encounter (Signed)
Noted  

## 2022-05-22 ENCOUNTER — Ambulatory Visit (INDEPENDENT_AMBULATORY_CARE_PROVIDER_SITE_OTHER): Payer: Medicare PPO | Admitting: Nurse Practitioner

## 2022-05-22 ENCOUNTER — Encounter: Payer: Self-pay | Admitting: Nurse Practitioner

## 2022-05-22 VITALS — BP 162/74 | HR 74 | Temp 97.1°F | Ht 65.0 in | Wt 127.8 lb

## 2022-05-22 DIAGNOSIS — K250 Acute gastric ulcer with hemorrhage: Secondary | ICD-10-CM

## 2022-05-22 DIAGNOSIS — I10 Essential (primary) hypertension: Secondary | ICD-10-CM

## 2022-05-22 LAB — CBC
HCT: 30.7 % — ABNORMAL LOW (ref 36.0–46.0)
Hemoglobin: 10.5 g/dL — ABNORMAL LOW (ref 12.0–15.0)
MCHC: 34.2 g/dL (ref 30.0–36.0)
MCV: 92.3 fl (ref 78.0–100.0)
Platelets: 271 10*3/uL (ref 150.0–400.0)
RBC: 3.33 Mil/uL — ABNORMAL LOW (ref 3.87–5.11)
RDW: 15.7 % — ABNORMAL HIGH (ref 11.5–15.5)
WBC: 7.6 10*3/uL (ref 4.0–10.5)

## 2022-05-22 MED ORDER — ONDANSETRON HCL 4 MG PO TABS: 4.0000 mg | ORAL_TABLET | Freq: Three times a day (TID) | ORAL | 0 refills | Status: DC | PRN

## 2022-05-22 NOTE — Assessment & Plan Note (Signed)
Chronic, not controlled. Her blood pressure is elevated today at 162/74. Her blood pressure was also elevated in the 150s-160s in the ER on 05/08/22. She was told to stop her hydralazine until she follows-up here or with GI. Will have her re-start the hydralazine 25mg  BID in addition to entresto 49-51 mg BID, amlodipine 5mg  daily, and carvedilol 6.25mg  BID. Follow-up in 4 weeks.

## 2022-05-22 NOTE — Progress Notes (Signed)
Established Patient Office Visit  Subjective   Patient ID: Lisa [name], female    DOB: 10/21/1950  Age: 72 y.o. MRN: [id number]  Chief Complaint  Patient presents with   Follow-up    4 week FU, patient said that she was in the hospital    HPI  Lisa [name] is here to follow-up after hospitalization on 05/04/22 through 05/06/22. She was diagnosed with a GI bleed requiring blood transfusion and endoscopy which found 5 gastric ulcers, which weren't bleeding at the time. She was started on protonix 40mg  BID for 6 weeks, then will go down to once a day. She ended up going back to the ER on 05/08/22 for dizziness, and hemoglobin was noted to be improving. She has an appointment to see GI on 06/11/22. She states that she is feeling better now. She brought all her medications to review. She was told to stop her hydralazine until she follows up with PCP. She has not taken any of her medications yet today.     ROS See pertinent positives and negatives per HPI.    Objective:     BP (!) 162/74 (BP Location: Right Arm, Cuff Size: Normal)   Pulse 74   Temp (!) 97.1 F (36.2 C) (Temporal)   Ht 5\' 5"  (1.651 m)   Wt 127 lb 12.8 oz (58 kg)   SpO2 99%   BMI 21.27 kg/m    Physical Exam Vitals and nursing note reviewed.  Constitutional:      General: She is not in acute distress.    Appearance: Normal appearance.  HENT:     Head: Normocephalic.  Eyes:     Conjunctiva/sclera: Conjunctivae normal.  Cardiovascular:     Rate and Rhythm: Normal rate and regular rhythm.     Pulses: Normal pulses.     Heart sounds: Normal heart sounds.  Pulmonary:     Effort: Pulmonary effort is normal.     Breath sounds: Normal breath sounds.  Abdominal:     Palpations: Abdomen is soft.     Tenderness: There is no abdominal tenderness.  Musculoskeletal:     Cervical back: Normal range of motion.  Skin:    General: Skin is warm.  Neurological:     General: No focal deficit present.     Mental  Status: She is alert and oriented to person, place, and time.  Psychiatric:        Mood and Affect: Mood normal.        Behavior: Behavior normal.        Thought Content: Thought content normal.        Judgment: Judgment normal.     Results for orders placed or performed in visit on 05/22/22  CBC  Result Value Ref Range   WBC 7.6 4.0 - 10.5 K/uL   RBC 3.33 (L) 3.87 - 5.11 Mil/uL   Platelets 271.0 150.0 - 400.0 K/uL   Hemoglobin 10.5 (L) 12.0 - 15.0 g/dL   HCT 30.7 (L) 36.0 - 46.0 %   MCV 92.3 78.0 - 100.0 fl   MCHC 34.2 30.0 - 36.0 g/dL   RDW 15.7 (H) 11.5 - 15.5 %      The 10-year ASCVD risk score (Arnett DK, et al., 2019) is: 32.6%    Assessment & Plan:   Problem List Items Addressed This Visit       Cardiovascular and Mediastinum   Essential hypertension    Chronic, not controlled. Her blood pressure is elevated today  at 162/74. Her blood pressure was also elevated in the 150s-160s in the ER on 05/08/22. She was told to stop her hydralazine until she follows-up here or with GI. Will have her re-start the hydralazine 25mg  BID in addition to entresto 49-51 mg BID, amlodipine 5mg  daily, and carvedilol 6.25mg  BID. Follow-up in 4 weeks.       Acute gastric ulcer with hemorrhage - Primary    She was recently hospitalized on 05/04/22-05/07/23 for 5 gastric ulcers confirmed with endoscopy. Will have her continue protonix 40mg  BID. Will check CBC and h. Pylori breath test today. Keep appointment with GI on 06/11/22. She has not noticed any blood in her stool.       Relevant Orders   H. pylori breath test   CBC (Completed)    Return in about 4 weeks (around 06/19/2022).    Charyl Dancer, NP

## 2022-05-22 NOTE — Patient Instructions (Signed)
It was great to see you!  Keep your appointment with GI on 06/11/22.   Re-start your plavix 1 tablet daily.   Re-start your hydralazine 25mg  twice a day for your blood pressure.   Let's follow-up in 4 weeks, sooner if you have concerns.  If a referral was placed today, you will be contacted for an appointment. Please note that routine referrals can sometimes take up to 3-4 weeks to process. Please call our office if you haven't heard anything after this time frame.  Take care,  Vance Peper, NP

## 2022-05-22 NOTE — Assessment & Plan Note (Signed)
She was recently hospitalized on 05/04/22-05/07/23 for 5 gastric ulcers confirmed with endoscopy. Will have her continue protonix 40mg  BID. Will check CBC and h. Pylori breath test today. Keep appointment with GI on 06/11/22. She has not noticed any blood in her stool.

## 2022-05-27 LAB — H. PYLORI BREATH TEST: H. pylori Breath Test: DETECTED — AB

## 2022-05-28 ENCOUNTER — Other Ambulatory Visit: Payer: Self-pay

## 2022-05-28 DIAGNOSIS — Z1231 Encounter for screening mammogram for malignant neoplasm of breast: Secondary | ICD-10-CM

## 2022-05-28 DIAGNOSIS — Z13820 Encounter for screening for osteoporosis: Secondary | ICD-10-CM

## 2022-05-28 MED ORDER — AMOXICILLIN 500 MG PO CAPS: 1000.0000 mg | ORAL_CAPSULE | Freq: Two times a day (BID) | ORAL | 0 refills | Status: AC

## 2022-05-28 MED ORDER — CLARITHROMYCIN 500 MG PO TABS: 500.0000 mg | ORAL_TABLET | Freq: Two times a day (BID) | ORAL | 0 refills | Status: DC

## 2022-05-28 NOTE — Addendum Note (Signed)
Addended by: Vance Peper A on: 05/28/2022 08:31 AM   Modules accepted: Orders

## 2022-05-30 ENCOUNTER — Telehealth: Payer: Medicare PPO

## 2022-05-30 ENCOUNTER — Other Ambulatory Visit: Payer: Self-pay | Admitting: Nurse Practitioner

## 2022-05-30 ENCOUNTER — Ambulatory Visit (INDEPENDENT_AMBULATORY_CARE_PROVIDER_SITE_OTHER): Payer: Medicare PPO

## 2022-05-30 DIAGNOSIS — I10 Essential (primary) hypertension: Secondary | ICD-10-CM

## 2022-05-30 DIAGNOSIS — E1140 Type 2 diabetes mellitus with diabetic neuropathy, unspecified: Secondary | ICD-10-CM

## 2022-05-30 NOTE — Chronic Care Management (AMB) (Signed)
Chronic Care Management   CCM RN Visit Note  05/30/2022 Name: Lisa [name] MRN: [id number] DOB: 13-Jun-1950  Subjective: Lisa [name] is a 72 y.o. year old female who is a primary care patient of [name], [name], NP. The patient was referred to the Chronic Care Management team for assistance with care management needs subsequent to provider initiation of CCM services and plan of care.    Today's Visit:  Engaged with patient by telephone for follow up visit.        Goals Addressed             This Visit's Progress    CCM Expected Outcome:  Monitor, Self-Manage and Reduce Symptoms of Diabetes       Current Barriers:  Knowledge Deficits related to the need to check blood sugars on a regular basis, preventive care needs, and importance of following recommendations by the provider for effective management of DM and other chronic conditions Care Coordination needs related to resources the patient needs for senior housing so she can have a safe place to live without fear of being asked to move out in a patient with DM Chronic Disease Management support and education needs related to effective management of DM Lacks caregiver support.  Film/video editor.  Lab Results  Component Value Date   HGBA1C 5.8 04/17/2022     Planned Interventions: Provided education to patient about basic DM disease process. Review and education. The patient has multiple chronic conditions impacting her care. Her DM are stable. Discussed changes to monitor for and when to call the provider.; Reviewed medications with patient and discussed importance of medication adherence. 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 Denies any swelling. States compliance with medications. The patient took all of her medications with her to her provider visit on 05-22-2022. Review and adjustments made. Will continue to monitor for  changes;        Reviewed prescribed diet with patient heart healthy/ADA diet. Education and support given. The patient states she is staying away for spicy foods. Is taking protonix and also has medications for nausea if she needs them. She says she has only had to take a couple of times ; Counseled on importance of regular laboratory monitoring as prescribed. Has regular lab testing;        Discussed plans with patient for ongoing care management follow up and provided patient with direct contact information for care management team;      Provided patient with written educational materials related to hypo and hyperglycemia and importance of correct treatment. Denies any drops in her blood sugars or highs. Will continue to monitor;       Reviewed scheduled/upcoming provider appointments including: 06-19-2022, 06-11-2022 with the GI specialist ;         Referral made to social work team for assistance with resources needed in the community for senior housing. In basket message sent to the scheduler for SW support scheduling. SW has spoke to the patient and resources provided. The patient knows to let the [facility] know of new needs or concerns;      Review of patient status, including review of consultants reports, relevant laboratory and other test results, and medications completed;       Advised patient to discuss changes in her DM, Questions, and concerns with provider;      Screening for signs and symptoms of depression related  to chronic disease state;        Assessed social determinant of health barriers;      Scheduled for mammogram and bone density test on 06-09-2022, gave her the number to the mobile unit today. It will be at [place] on the 26th.    Symptom Management: Take medications as prescribed   Attend all scheduled provider appointments Call provider office for new concerns or questions  call the Suicide and Crisis Lifeline: 988 call the [place] National Suicide Prevention Lifeline:  334 706 7748 or TTY: 4843404398 TTY (440) 497-7348) to talk to a trained counselor call 1-800-273-TALK (toll free, 24 hour hotline) if experiencing a Mental Health or [place]  schedule appointment with eye doctor check feet daily for cuts, sores or redness trim toenails straight across wash and dry feet carefully every day wear comfortable, cotton socks wear comfortable, well-fitting shoes  Follow Up Plan: Telephone follow up appointment with care management team member scheduled for: 07-18-2022 at 0900 am       CCM Expected Outcome:  Monitor, Self-Manage, and Reduce Symptoms of Hypertension       Current Barriers:  Knowledge Deficits related to the benefits of taking blood pressures on a regular basis to monitor changes in blood pressures and decrease risk of heart attack and stroke Care Coordination needs related to educational needs for a patient with HTN and other chronic conditions and resources in the community to help patient with housing needs  in a patient with HTN Chronic Disease Management support and education needs related to effective management of HTN Lacks caregiver support.  Needs stable housing, currently lives with her cousin but needs to find a place of her own. Asking for help with resources  BP Readings from Last 3 Encounters:  05/22/22 (!) 162/74  05/08/22 (!) 152/85  05/06/22 (!) 130/57     Planned Interventions: Evaluation of current treatment plan related to hypertension self management and patient's adherence to plan as established by provider.The patient was recently in the hospital due to a GI bleed. She has several ulcers. She was in the hospital from 1-21 to 05-06-2022. She also went back to the ED on 05-08-2022 for dizziness. The patient was in the office to see the pcp on 05-22-2022 post hospital stay. Her pressures were elevated that day she was in the office; however she had not taken any of her medications. She is now taking her  medications and she also is checking her blood pressures at home.  Provided education to patient re: stroke prevention, s/s of heart attack and stroke. The patient has a history of stroke and the patient mindful of the impact of uncontrolled blood pressures and risk of HA and stroke; Reviewed prescribed diet heart healthy/ADA diet- the patient states she is mindful of her dietary restrictions and she monitors her sodium and sugar intake. She also is monitoring for spicy foods now and knows she needs to watch her intake closely due to the recent hospitalization with finding ulcers present. The patient states today that she is doing "real good" because she "loves herself".  She states that she is saying away from spicy foods and she is also not smoking or drinking "beer". She feels safe and secure.  Reviewed medications with patient and discussed importance of compliance. The patient states that she does have her medications and she is taking her medications as directed. Has her medications and is compliant with medications. The patient is back on her hydralazine 25 mg BID, Entresto 49-51 mg  BID, Amlodipine 5 mg QD and Carvedilol 6.25 mg BID. She is also still taking her Protonix 40 mg BID.  Discussed plans with patient for ongoing care management follow up and provided patient with direct contact information for care management team; Advised patient, providing education and rationale, to monitor blood pressure daily and record, calling PCP for findings outside established parameters;  Reviewed scheduled/upcoming provider appointments including: 06-19-2022 with the pcp, 06-11-2022 with the GI specialist. Having a mammogram on the mobile unit on 06-09-2022 Advised patient to discuss changes in her blood pressures or questions/Concerns about her heart health with provider; Provided education on prescribed diet heart healthy/ADA diet. Review and education. Also discussed avoiding spicy foods that could aggravate the  presence of ulcers ;  Discussed complications of poorly controlled blood pressure such as heart disease, stroke, circulatory complications, vision complications, kidney impairment, sexual dysfunction;  Screening for signs and symptoms of depression related to chronic disease state;  Assessed social determinant of health barriers;   Symptom Management: Take medications as prescribed   Attend all scheduled provider appointments Call provider office for new concerns or questions  call the Suicide and Crisis Lifeline: 988 call the [place] National Suicide Prevention Lifeline: (508)453-0608 or TTY: 520-745-6977 TTY 541 475 5968) to talk to a trained counselor call 1-800-273-TALK (toll free, 24 hour hotline) if experiencing a Mental Health or [place]  check blood pressure weekly learn about high blood pressure call doctor for signs and symptoms of high blood pressure develop an action plan for high blood pressure keep all doctor appointments take medications for blood pressure exactly as prescribed report new symptoms to your doctor  Follow Up Plan: Telephone follow up appointment with care management team member scheduled for: 07-18-2022 at 0900 am          Plan:Telephone follow up appointment with care management team member scheduled for:  07-18-2022 at 0900 am  Noreene Larsson RN, MSN, CCM RN Care Manager  Chronic Care Management Direct Number: [phone]

## 2022-05-30 NOTE — Patient Instructions (Signed)
Please call the care guide team at [phone] if you need to cancel or reschedule your appointment.   If you are experiencing a Mental Health or [place] or need someone to talk to, please call the Suicide and Crisis Lifeline: 988 call the [place] National Suicide Prevention Lifeline: 574 221 5215 or TTY: 563-419-2078 TTY 931-196-4140) to talk to a trained counselor call 1-800-273-TALK (toll free, 24 hour hotline)   Following is a copy of the CCM Program Consent:  CCM service includes personalized support from designated clinical staff supervised by the physician, including individualized plan of care and coordination with other care providers 24/7 contact phone numbers for assistance for urgent and routine care needs. Service will only be billed when office clinical staff spend 20 minutes or more in a month to coordinate care. Only one practitioner may furnish and bill the service in a calendar month. The patient may stop CCM services at amy time (effective at the end of the month) by phone call to the office staff. The patient will be responsible for cost sharing (co-pay) or up to 20% of the service fee (after annual deductible is met)  Following is a copy of your full provider care plan:   Goals Addressed             This Visit's Progress    CCM Expected Outcome:  Monitor, Self-Manage and Reduce Symptoms of Diabetes       Current Barriers:  Knowledge Deficits related to the need to check blood sugars on a regular basis, preventive care needs, and importance of following recommendations by the provider for effective management of DM and other chronic conditions Care Coordination needs related to resources the patient needs for senior housing so she can have a safe place to live without fear of being asked to move out in a patient with DM Chronic Disease Management support and education needs related to effective management of DM Lacks caregiver support.  Museum/gallery exhibitions officer.  Lab Results  Component Value Date   HGBA1C 5.8 04/17/2022     Planned Interventions: Provided education to patient about basic DM disease process. Review and education. The patient has multiple chronic conditions impacting her care. Her DM are stable. Discussed changes to monitor for and when to call the provider.; Reviewed medications with patient and discussed importance of medication adherence. 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 Denies any swelling. States compliance with medications. The patient took all of her medications with her to her provider visit on 05-22-2022. Review and adjustments made. Will continue to monitor for changes;        Reviewed prescribed diet with patient heart healthy/ADA diet. Education and support given. The patient states she is staying away for spicy foods. Is taking protonix and also has medications for nausea if she needs them. She says she has only had to take a couple of times ; Counseled on importance of regular laboratory monitoring as prescribed. Has regular lab testing;        Discussed plans with patient for ongoing care management follow up and provided patient with direct contact information for care management team;      Provided patient with written educational materials related to hypo and hyperglycemia and importance of correct treatment. Denies any drops in her blood sugars or highs. Will continue to monitor;       Reviewed scheduled/upcoming provider appointments including: 06-19-2022, 06-11-2022  with the GI specialist ;         Referral made to social work team for assistance with resources needed in the community for senior housing. In basket message sent to the scheduler for SW support scheduling. SW has spoke to the patient and resources provided. The patient knows to let the [facility] know of new needs or concerns;      Review of patient status,  including review of consultants reports, relevant laboratory and other test results, and medications completed;       Advised patient to discuss changes in her DM, Questions, and concerns with provider;      Screening for signs and symptoms of depression related to chronic disease state;        Assessed social determinant of health barriers;      Scheduled for mammogram and bone density test on 06-09-2022, gave her the number to the mobile unit today. It will be at [place] on the 26th.    Symptom Management: Take medications as prescribed   Attend all scheduled provider appointments Call provider office for new concerns or questions  call the Suicide and Crisis Lifeline: 988 call the [place] National Suicide Prevention Lifeline: 425-588-3891 or TTY: 224-807-2467 TTY 678-181-5706) to talk to a trained counselor call 1-800-273-TALK (toll free, 24 hour hotline) if experiencing a Mental Health or [place]  schedule appointment with eye doctor check feet daily for cuts, sores or redness trim toenails straight across wash and dry feet carefully every day wear comfortable, cotton socks wear comfortable, well-fitting shoes  Follow Up Plan: Telephone follow up appointment with care management team member scheduled for: 07-18-2022 at 0900 am       CCM Expected Outcome:  Monitor, Self-Manage, and Reduce Symptoms of Hypertension       Current Barriers:  Knowledge Deficits related to the benefits of taking blood pressures on a regular basis to monitor changes in blood pressures and decrease risk of heart attack and stroke Care Coordination needs related to educational needs for a patient with HTN and other chronic conditions and resources in the community to help patient with housing needs  in a patient with HTN Chronic Disease Management support and education needs related to effective management of HTN Lacks caregiver support.  Needs stable housing, currently lives with her cousin  but needs to find a place of her own. Asking for help with resources  BP Readings from Last 3 Encounters:  05/22/22 (!) 162/74  05/08/22 (!) 152/85  05/06/22 (!) 130/57     Planned Interventions: Evaluation of current treatment plan related to hypertension self management and patient's adherence to plan as established by provider.The patient was recently in the hospital due to a GI bleed. She has several ulcers. She was in the hospital from 1-21 to 05-06-2022. She also went back to the ED on 05-08-2022 for dizziness. The patient was in the office to see the pcp on 05-22-2022 post hospital stay. Her pressures were elevated that day she was in the office; however she had not taken any of her medications. She is now taking her medications and she also is checking her blood pressures at home.  Provided education to patient re: stroke prevention, s/s of heart attack and stroke. The patient has a history of stroke and the patient mindful of the impact of uncontrolled blood pressures and risk of HA and stroke; Reviewed prescribed diet heart healthy/ADA diet- the patient states she is mindful of her dietary restrictions and she monitors  her sodium and sugar intake. She also is monitoring for spicy foods now and knows she needs to watch her intake closely due to the recent hospitalization with finding ulcers present. The patient states today that she is doing "real good" because she "loves herself".  She states that she is saying away from spicy foods and she is also not smoking or drinking "beer". She feels safe and secure.  Reviewed medications with patient and discussed importance of compliance. The patient states that she does have her medications and she is taking her medications as directed. Has her medications and is compliant with medications. The patient is back on her hydralazine 25 mg BID, Entresto 49-51 mg BID, Amlodipine 5 mg QD and Carvedilol 6.25 mg BID. She is also still taking her Protonix 40 mg BID.   Discussed plans with patient for ongoing care management follow up and provided patient with direct contact information for care management team; Advised patient, providing education and rationale, to monitor blood pressure daily and record, calling PCP for findings outside established parameters;  Reviewed scheduled/upcoming provider appointments including: 06-19-2022 with the pcp, 06-11-2022 with the GI specialist. Having a mammogram on the mobile unit on 06-09-2022 Advised patient to discuss changes in her blood pressures or questions/Concerns about her heart health with provider; Provided education on prescribed diet heart healthy/ADA diet. Review and education. Also discussed avoiding spicy foods that could aggravate the presence of ulcers ;  Discussed complications of poorly controlled blood pressure such as heart disease, stroke, circulatory complications, vision complications, kidney impairment, sexual dysfunction;  Screening for signs and symptoms of depression related to chronic disease state;  Assessed social determinant of health barriers;   Symptom Management: Take medications as prescribed   Attend all scheduled provider appointments Call provider office for new concerns or questions  call the Suicide and Crisis Lifeline: 988 call the [place] National Suicide Prevention Lifeline: 515 775 1555 or TTY: (925)461-6830 TTY (450)142-3212) to talk to a trained counselor call 1-800-273-TALK (toll free, 24 hour hotline) if experiencing a Mental Health or [place]  check blood pressure weekly learn about high blood pressure call doctor for signs and symptoms of high blood pressure develop an action plan for high blood pressure keep all doctor appointments take medications for blood pressure exactly as prescribed report new symptoms to your doctor  Follow Up Plan: Telephone follow up appointment with care management team member scheduled for: 07-18-2022 at 0900 am           The patient verbalized understanding of instructions, educational materials, and care plan provided today and DECLINED offer to receive copy of patient instructions, educational materials, and care plan.   Telephone follow up appointment with care management team member scheduled for: 07-18-2022 at 0900 am

## 2022-06-02 ENCOUNTER — Other Ambulatory Visit: Payer: Self-pay | Admitting: Nurse Practitioner

## 2022-06-02 ENCOUNTER — Telehealth: Payer: Self-pay | Admitting: Nurse Practitioner

## 2022-06-02 NOTE — Telephone Encounter (Signed)
[place] to schedule their annual wellness visit. Appointment made for 06/09/22.  [place] [employer]ards AWV direct phone # [phone]

## 2022-06-04 NOTE — Telephone Encounter (Signed)
I spoke with patient and she said that she is only taking this for nausea.  [name] notified.

## 2022-06-04 NOTE — Telephone Encounter (Signed)
LVM for patient to return call. 

## 2022-06-09 ENCOUNTER — Ambulatory Visit: Admission: RE | Admit: 2022-06-09 | Payer: Medicare PPO | Source: Ambulatory Visit

## 2022-06-09 ENCOUNTER — Ambulatory Visit (INDEPENDENT_AMBULATORY_CARE_PROVIDER_SITE_OTHER): Payer: Medicare PPO

## 2022-06-09 VITALS — Ht 65.0 in | Wt 128.1 lb

## 2022-06-09 DIAGNOSIS — Z0000 Encounter for general adult medical examination without abnormal findings: Secondary | ICD-10-CM | POA: Diagnosis not present

## 2022-06-09 NOTE — Progress Notes (Signed)
I connected with  Lisa [name] on 06/09/22 by a audio enabled telemedicine application and verified that I am speaking with the correct person using two identifiers.  Patient Location: Home  Provider Location: Office/Clinic  I discussed the limitations of evaluation and management by telemedicine. The patient expressed understanding and agreed to proceed.  Subjective:   Lisa [name] is a 72 y.o. female who presents for an Initial Medicare Annual Wellness Visit.  Review of Systems     Cardiac Risk Factors include: advanced age (>15mn, >>69women);diabetes mellitus;dyslipidemia;hypertension     Objective:    Today's Vitals   06/09/22 0911  Weight: 128 lb 1.9 oz (58.1 kg)  Height: '5\' 5"'$  (1.651 m)   Body mass index is 21.32 kg/m.     06/09/2022    9:16 AM 05/04/2022   10:13 AM 10/30/2020    9:20 AM 08/29/2020    1:50 PM 06/20/2020    9:55 AM 11/18/2018    1:16 AM 11/19/2011   10:00 PM  Advanced Directives  Does Patient Have a Medical Advance Directive? No No No No No No Patient does not have advance directive;Patient would not like information  Would patient like information on creating a medical advance directive?  No - Patient declined No - Patient declined No - Patient declined No - Patient declined    Pre-existing out of facility DNR order (yellow form or pink MOST form)       No    Current Medications (verified) Outpatient Encounter Medications as of 06/09/2022  Medication Sig   Accu-Chek Softclix Lancets lancets Use as instructed   acetaminophen (TYLENOL) 500 MG tablet Take 1,000 mg by mouth every 6 (six) hours as needed for moderate pain or mild pain.   amLODipine (NORVASC) 5 MG tablet Take 1 tablet (5 mg total) by mouth daily.   amoxicillin (AMOXIL) 500 MG capsule Take 2 capsules (1,000 mg total) by mouth 2 (two) times daily for 14 days.   atorvastatin (LIPITOR) 40 MG tablet Take 40 mg by mouth daily.   Blood Glucose Monitoring Suppl (ACCU-CHEK AVIVA PLUS)  w/Device KIT 1 each by Does not apply route daily.   carvedilol (COREG) 12.5 MG tablet Take 0.5 tablets (6.25 mg total) by mouth 2 (two) times daily with a meal.   citalopram (CELEXA) 20 MG tablet Take 1 tablet (20 mg total) by mouth daily.   clarithromycin (BIAXIN) 500 MG tablet Take 1 tablet (500 mg total) by mouth 2 (two) times daily.   clopidogrel (PLAVIX) 75 MG tablet Take 1 tablet (75 mg total) by mouth daily.   diclofenac Sodium (VOLTAREN) 1 % GEL APPLY 2 GRAMS TOPICALLY FOUR TIMES DAILY (Patient taking differently: Apply 2 g topically 4 (four) times daily as needed (pain).)   gabapentin (NEURONTIN) 100 MG capsule Take 1 capsule (100 mg total) by mouth at bedtime.   glucose blood (ACCU-CHEK AVIVA PLUS) test strip Use as instructed   glucose blood (ONETOUCH ULTRA) test strip Use as instructed   melatonin 3 MG TABS tablet TAKE 1 TABLET AT BEDTIME   metFORMIN (GLUCOPHAGE) 500 MG tablet Take 250 mg by mouth 2 (two) times daily with a meal.   ondansetron (ZOFRAN) 4 MG tablet TAKE 1 TABLET EVERY 8 HOURS AS NEEDED FOR NAUSEA OR VOMITING   pantoprazole (PROTONIX) 40 MG tablet Take 1 tablet (40 mg) twice daily for 6 weeks, THEN take 1 tablet (40 mg total) once daily thereafter.   sacubitril-valsartan (ENTRESTO) 49-51 MG Take 1 tablet by mouth  2 (two) times daily.   No facility-administered encounter medications on file as of 06/09/2022.    Allergies (verified) Ace inhibitors, Aspirin, Codeine, and Hydrocodone   History: Past Medical History:  Diagnosis Date   ARF (acute renal failure) ([place]) 11/18/2018   Diabetes mellitus ([place]) 2007   High cholesterol 2007   Hypertension    Peptic ulcer 1965   Stroke [facility])    Past Surgical History:  Procedure Laterality Date   CESAREAN SECTION     ESOPHAGOGASTRODUODENOSCOPY (EGD) WITH PROPOFOL N/A 05/04/2022   Procedure: ESOPHAGOGASTRODUODENOSCOPY (EGD) WITH PROPOFOL;  Surgeon: Yetta Flock, MD;  Location: [place];  Service:  Gastroenterology;  Laterality: N/A;   RIGHT/LEFT HEART CATH AND CORONARY ANGIOGRAPHY N/A 11/19/2018   Procedure: RIGHT/LEFT HEART CATH AND CORONARY ANGIOGRAPHY;  Surgeon: Dixie Dials, MD;  Location: [place] [facility];  Service: Cardiovascular;  Laterality: N/A;   Family History  Problem Relation Age of Onset   Kidney disease Father    Heart disease Father    Cancer Brother        Throat    Birth defects Son    Social History   Socioeconomic History   Marital status: Widowed    Spouse name: Mervin Brunet   Number of children: 2   Years of education: 11   Highest education level: Not on file  Occupational History   Occupation: Homemaker   Tobacco Use   Smoking status: Former    Packs/day: 0.25    Years: 25.00    Total pack years: 6.25    Types: Cigarettes    Quit date: 04/07/2013    Years since quitting: 9.1   Smokeless tobacco: Former    Quit date: 04/07/2013   Tobacco comments:    2 cigs a day  Vaping Use   Vaping Use: Never used  Substance and Sexual Activity   Alcohol use: Not Currently    Comment: Socially, drinks 3 drinks twice per week    Drug use: No   Sexual activity: Yes  Other Topics Concern   Not on file  Social History Narrative   Lives with cousin. Was homeless for 1 year.Husband died 04/12/2012.    Social Determinants of Health   Financial Resource Strain: Low Risk  (06/09/2022)   Overall Financial Resource Strain (CARDIA)    Difficulty of Paying Living Expenses: Not hard at all  Recent Concern: Financial Resource Strain - Medium Risk (04/24/2022)   Overall Financial Resource Strain (CARDIA)    Difficulty of Paying Living Expenses: Somewhat hard  Food Insecurity: No Food Insecurity (06/09/2022)   Hunger Vital Sign    Worried About Running Out of Food in the Last Year: Never true    Ran Out of Food in the Last Year: Never true  Transportation Needs: No Transportation Needs (06/09/2022)   PRAPARE - Hydrologist (Medical):  No    Lack of Transportation (Non-Medical): No  Physical Activity: Inactive (06/09/2022)   Exercise Vital Sign    Days of Exercise per Week: 0 days    Minutes of Exercise per Session: 0 min  Stress: No Stress Concern Present (06/09/2022)   [name]    Feeling of Stress : Not at all  Social Connections: Moderately Isolated (04/24/2022)   Social Connection and Isolation Panel [NHANES]    Frequency of Communication with Friends and Family: More than three times a week    Frequency of Social Gatherings with Friends  and Family: More than three times a week    Attends Religious Services: More than 4 times per year    Active Member of Clubs or Organizations: No    Attends Archivist Meetings: Not on file    Marital Status: Widowed    Tobacco Counseling Counseling given: Not Answered Tobacco comments: 2 cigs a day   Clinical Intake:  Pre-visit preparation completed: Yes  Pain : No/denies pain     Nutritional Status: BMI of 19-24  Normal Nutritional Risks: Nausea/ vomitting/ diarrhea (nausea getting better) Diabetes: Yes  How often do you need to have someone help you when you read instructions, pamphlets, or other written materials from your doctor or pharmacy?: 1 - Never  Diabetic? Yes Nutrition Risk Assessment:  Has the patient had any N/V/D within the last 2 months?  Yes  Does the patient have any non-healing wounds?  No  Has the patient had any unintentional weight loss or weight gain?  No   Diabetes:  Is the patient diabetic?  Yes  If diabetic, was a CBG obtained today?  No  Did the patient bring in their glucometer from home?  No  How often do you monitor your CBG's? Twice daily.   Financial Strains and Diabetes Management:  Are you having any financial strains with the device, your supplies or your medication? No .  Does the patient want to be seen by Chronic Care Management for management  of their diabetes?  No  Would the patient like to be referred to a Nutritionist or for Diabetic Management?  No   Diabetic Exams:  Diabetic Eye Exam: Overdue for diabetic eye exam. Pt has been advised about the importance in completing this exam. Patient advised to call and schedule an eye exam. Diabetic Foot Exam: Completed 04/17/2022   Interpreter Needed?: No  Information entered by :: NAllen LPN   Activities of Daily Living    06/09/2022    9:17 AM 05/04/2022    5:00 PM  In your present state of health, do you have any difficulty performing the following activities:  Hearing? 0 0  Vision? 0 0  Difficulty concentrating or making decisions? 0 0  Walking or climbing stairs? 0 1  Dressing or bathing? 0 0  Doing errands, shopping? 0 0  Preparing Food and eating ? N   Using the Toilet? N   In the past six months, have you accidently leaked urine? N   Do you have problems with loss of bowel control? N   Managing your Medications? N   Managing your Finances? N   Housekeeping or managing your Housekeeping? N     Patient Care Team: Charyl Dancer, NP as PCP - General (Internal Medicine) Vanita Ingles, RN as Case Manager (General Practice)  Indicate any recent Medical Services you may have received from other than [facility] providers in the past year (date may be approximate).     Assessment:   This is a routine wellness examination for [place].  Hearing/Vision screen Vision Screening - Comments:: Regular eye exams, [facility]  Dietary issues and exercise activities discussed: Current Exercise Habits: The patient does not participate in regular exercise at present   Goals Addressed             This Visit's Progress    Patient Stated       06/09/2022, wants to have own place       Depression Screen    06/09/2022  9:17 AM 05/22/2022   10:46 AM 04/17/2022    1:29 PM 10/30/2020    9:19 AM 09/17/2020    2:48 PM 08/29/2020    1:50 PM 07/12/2020   10:03 AM  PHQ 2/9  Scores  PHQ - 2 Score 0 0 0 0 0 0 3  PHQ- 9 Score  '3 3  3 '$ 0 6    Fall Risk    06/09/2022    9:16 AM 05/22/2022   10:09 AM 04/24/2022    4:15 PM 04/17/2022   10:09 AM 10/30/2020    9:19 AM  Fall Risk   Falls in the past year? 0 0 0 1 0  Number falls in past yr: 0 0 0  0  Injury with Fall? 0 0 0  0  Risk for fall due to : Medication side effect No Fall Risks Impaired balance/gait Impaired balance/gait   Follow up Falls prevention discussed;Education provided;Falls evaluation completed Falls evaluation completed Falls evaluation completed;Education provided;Falls prevention discussed Falls evaluation completed     FALL RISK PREVENTION PERTAINING TO THE HOME:  Any stairs in or around the home? Yes  If so, are there any without handrails? No  Home free of loose throw rugs in walkways, pet beds, electrical cords, etc? Yes  Adequate lighting in your home to reduce risk of falls? Yes   ASSISTIVE DEVICES UTILIZED TO PREVENT FALLS:  Life alert? No  Use of a cane, walker or w/c? No  Grab bars in the bathroom? Yes  Shower chair or bench in shower? Yes  Elevated toilet seat or a handicapped toilet? No   TIMED UP AND GO:  Was the test performed? No .      Cognitive Function:        06/09/2022    9:17 AM  6CIT Screen  What Year? 0 points  What month? 0 points  What time? 0 points  Count back from 20 0 points  Months in reverse 0 points  Repeat phrase 6 points  Total Score 6 points    Immunizations Immunization History  Administered Date(s) Administered   Fluad Quad(high Dose 65+) 06/20/2020, 04/17/2022   Influenza,inj,Quad PF,6+ Mos 01/20/2013   PFIZER Comirnaty(Gray Top)Covid-19 Tri-Sucrose Vaccine 06/20/2020, 07/12/2020    TDAP status: Due, Education has been provided regarding the importance of this vaccine. Advised may receive this vaccine at local pharmacy or Health Dept. Aware to provide a copy of the vaccination record if obtained from local pharmacy or Health Dept.  Verbalized acceptance and understanding.  Flu Vaccine status: Up to date  Pneumococcal vaccine status: Due, Education has been provided regarding the importance of this vaccine. Advised may receive this vaccine at local pharmacy or Health Dept. Aware to provide a copy of the vaccination record if obtained from local pharmacy or Health Dept. Verbalized acceptance and understanding.  Covid-19 vaccine status: Completed vaccines  Qualifies for Shingles Vaccine? Yes   Zostavax completed No   Shingrix Completed?: No.    Education has been provided regarding the importance of this vaccine. Patient has been advised to call insurance company to determine out of pocket expense if they have not yet received this vaccine. Advised may also receive vaccine at local pharmacy or Health Dept. Verbalized acceptance and understanding.  Screening Tests Health Maintenance  Topic Date Due   Medicare Annual Wellness (AWV)  Never done   OPHTHALMOLOGY EXAM  Never done   DTaP/Tdap/Td (1 - Tdap) Never done   COLONOSCOPY (Pts 45-56yr Insurance coverage will need to  be confirmed)  Never done   MAMMOGRAM  Never done   Zoster Vaccines- Shingrix (1 of 2) Never done   Pneumonia Vaccine 60+ Years old (1 of 1 - PCV) Never done   DEXA SCAN  Never done   COVID-19 Vaccine (3 - 2023-24 season) 12/13/2021   HEMOGLOBIN A1C  10/16/2022   Diabetic kidney evaluation - Urine ACR  04/18/2023   FOOT EXAM  04/18/2023   Diabetic kidney evaluation - eGFR measurement  05/09/2023   INFLUENZA VACCINE  Completed   Hepatitis C Screening  Completed   HPV VACCINES  Aged Out    Health Maintenance  Health Maintenance Due  Topic Date Due   Medicare Annual Wellness (AWV)  Never done   OPHTHALMOLOGY EXAM  Never done   DTaP/Tdap/Td (1 - Tdap) Never done   COLONOSCOPY (Pts 45-38yr Insurance coverage will need to be confirmed)  Never done   MAMMOGRAM  Never done   Zoster Vaccines- Shingrix (1 of 2) Never done   Pneumonia Vaccine 71  Years old (1 of 1 - PCV) Never done   DEXA SCAN  Never done   COVID-19 Vaccine (3 - 2023-24 season) 12/13/2021    Colorectal cancer screening: due  Mammogram status: due  Bone Density status:   Lung Cancer Screening: (Low Dose CT Chest recommended if Age 71-80years, 30 pack-year currently smoking OR have quit w/in 15years.) does not qualify.   Lung Cancer Screening Referral: no  Additional Screening:  Hepatitis C Screening: does qualify; Completed 11/18/2018  Vision Screening: Recommended annual ophthalmology exams for early detection of glaucoma and other disorders of the eye. Is the patient up to date with their annual eye exam?  Yes  Who is the provider or what is the name of the office in which the patient attends annual eye exams? [facility] If pt is not established with a provider, would they like to be referred to a provider to establish care? No .   Dental Screening: Recommended annual dental exams for proper oral hygiene  Community Resource Referral / Chronic Care Management: CRR required this visit?  No   CCM required this visit?  No      Plan:     I have personally reviewed and noted the following in the patient's chart:   Medical and social history Use of alcohol, tobacco or illicit drugs  Current medications and supplements including opioid prescriptions. Patient is not currently taking opioid prescriptions. Functional ability and status Nutritional status Physical activity Advanced directives List of other physicians Hospitalizations, surgeries, and ER visits in previous 12 months Vitals Screenings to include cognitive, depression, and falls Referrals and appointments  In addition, I have reviewed and discussed with patient certain preventive protocols, quality metrics, and best practice recommendations. A written personalized care plan for preventive services as well as general preventive health recommendations were provided to patient.      NKellie Simmering LPN   [id number]  Nurse Notes: none  Due to this being a virtual visit, the after visit summary with patients personalized plan was offered to patient via mail or my-chart. to pick up at office at next visit

## 2022-06-09 NOTE — Patient Instructions (Signed)
Lisa [name] , Thank you for taking time to come for your Medicare Wellness Visit. I appreciate your ongoing commitment to your health goals. Please review the following plan we discussed and let me know if I can assist you in the future.   These are the goals we discussed:  Goals      Blood Pressure < 140/90     CCM Expected Outcome:  Monitor, Self-Manage and Reduce Symptoms of Diabetes     Current Barriers:  Knowledge Deficits related to the need to check blood sugars on a regular basis, preventive care needs, and importance of following recommendations by the provider for effective management of DM and other chronic conditions Care Coordination needs related to resources the patient needs for senior housing so she can have a safe place to live without fear of being asked to move out in a patient with DM Chronic Disease Management support and education needs related to effective management of DM Lacks caregiver support.  Film/video editor.  Lab Results  Component Value Date   HGBA1C 5.8 04/17/2022     Planned Interventions: Provided education to patient about basic DM disease process. Review and education. The patient has multiple chronic conditions impacting her care. Her DM are stable. Discussed changes to monitor for and when to call the provider.; Reviewed medications with patient and discussed importance of medication adherence. 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 Denies any swelling. States compliance with medications. The patient took all of her medications with her to her provider visit on 05-22-2022. Review and adjustments made. Will continue to monitor for changes;        Reviewed prescribed diet with patient heart healthy/ADA diet. Education and support given. The patient states she is staying away for spicy foods. Is taking protonix and also has medications for nausea if she needs them.  She says she has only had to take a couple of times ; Counseled on importance of regular laboratory monitoring as prescribed. Has regular lab testing;        Discussed plans with patient for ongoing care management follow up and provided patient with direct contact information for care management team;      Provided patient with written educational materials related to hypo and hyperglycemia and importance of correct treatment. Denies any drops in her blood sugars or highs. Will continue to monitor;       Reviewed scheduled/upcoming provider appointments including: 06-19-2022, 06-11-2022 with the GI specialist ;         Referral made to social work team for assistance with resources needed in the community for senior housing. In basket message sent to the scheduler for SW support scheduling. SW has spoke to the patient and resources provided. The patient knows to let the [facility] know of new needs or concerns;      Review of patient status, including review of consultants reports, relevant laboratory and other test results, and medications completed;       Advised patient to discuss changes in her DM, Questions, and concerns with provider;      Screening for signs and symptoms of depression related to chronic disease state;        Assessed social determinant of health barriers;      Scheduled for mammogram and bone density test on 06-09-2022, gave her the number to the mobile unit today. It will be at [place] on the  26th.    Symptom Management: Take medications as prescribed   Attend all scheduled provider appointments Call provider office for new concerns or questions  call the Suicide and Crisis Lifeline: 988 call the [place] National Suicide Prevention Lifeline: 912-500-4677 or TTY: (902)880-2577 TTY 531-638-0832) to talk to a trained counselor call 1-800-273-TALK (toll free, 24 hour hotline) if experiencing a Mental Health or [place]  schedule appointment with eye doctor check feet  daily for cuts, sores or redness trim toenails straight across wash and dry feet carefully every day wear comfortable, cotton socks wear comfortable, well-fitting shoes  Follow Up Plan: Telephone follow up appointment with care management team member scheduled for: 07-18-2022 at 0900 am       CCM Expected Outcome:  Monitor, Self-Manage, and Reduce Symptoms of Hypertension     Current Barriers:  Knowledge Deficits related to the benefits of taking blood pressures on a regular basis to monitor changes in blood pressures and decrease risk of heart attack and stroke Care Coordination needs related to educational needs for a patient with HTN and other chronic conditions and resources in the community to help patient with housing needs  in a patient with HTN Chronic Disease Management support and education needs related to effective management of HTN Lacks caregiver support.  Needs stable housing, currently lives with her cousin but needs to find a place of her own. Asking for help with resources  BP Readings from Last 3 Encounters:  05/22/22 (!) 162/74  05/08/22 (!) 152/85  05/06/22 (!) 130/57     Planned Interventions: Evaluation of current treatment plan related to hypertension self management and patient's adherence to plan as established by provider.The patient was recently in the hospital due to a GI bleed. She has several ulcers. She was in the hospital from 1-21 to 05-06-2022. She also went back to the ED on 05-08-2022 for dizziness. The patient was in the office to see the pcp on 05-22-2022 post hospital stay. Her pressures were elevated that day she was in the office; however she had not taken any of her medications. She is now taking her medications and she also is checking her blood pressures at home.  Provided education to patient re: stroke prevention, s/s of heart attack and stroke. The patient has a history of stroke and the patient mindful of the impact of uncontrolled blood pressures  and risk of HA and stroke; Reviewed prescribed diet heart healthy/ADA diet- the patient states she is mindful of her dietary restrictions and she monitors her sodium and sugar intake. She also is monitoring for spicy foods now and knows she needs to watch her intake closely due to the recent hospitalization with finding ulcers present. The patient states today that she is doing "real good" because she "loves herself".  She states that she is saying away from spicy foods and she is also not smoking or drinking "beer". She feels safe and secure.  Reviewed medications with patient and discussed importance of compliance. The patient states that she does have her medications and she is taking her medications as directed. Has her medications and is compliant with medications. The patient is back on her hydralazine 25 mg BID, Entresto 49-51 mg BID, Amlodipine 5 mg QD and Carvedilol 6.25 mg BID. She is also still taking her Protonix 40 mg BID.  Discussed plans with patient for ongoing care management follow up and provided patient with direct contact information for care management team; Advised patient, providing education and rationale, to  monitor blood pressure daily and record, calling PCP for findings outside established parameters;  Reviewed scheduled/upcoming provider appointments including: 06-19-2022 with the pcp, 06-11-2022 with the GI specialist. Having a mammogram on the mobile unit on 06-09-2022 Advised patient to discuss changes in her blood pressures or questions/Concerns about her heart health with provider; Provided education on prescribed diet heart healthy/ADA diet. Review and education. Also discussed avoiding spicy foods that could aggravate the presence of ulcers ;  Discussed complications of poorly controlled blood pressure such as heart disease, stroke, circulatory complications, vision complications, kidney impairment, sexual dysfunction;  Screening for signs and symptoms of depression related  to chronic disease state;  Assessed social determinant of health barriers;   Symptom Management: Take medications as prescribed   Attend all scheduled provider appointments Call provider office for new concerns or questions  call the Suicide and Crisis Lifeline: 988 call the [place] National Suicide Prevention Lifeline: (980) 722-6971 or TTY: (207)322-9071 TTY (913) 543-3598) to talk to a trained counselor call 1-800-273-TALK (toll free, 24 hour hotline) if experiencing a Mental Health or [place]  check blood pressure weekly learn about high blood pressure call doctor for signs and symptoms of high blood pressure develop an action plan for high blood pressure keep all doctor appointments take medications for blood pressure exactly as prescribed report new symptoms to your doctor  Follow Up Plan: Telephone follow up appointment with care management team member scheduled for: 07-18-2022 at 0900 am       Patient Stated     06/09/2022, wants to have own place        This is a list of the screening recommended for you and due dates:  Health Maintenance  Topic Date Due   Eye exam for diabetics  Never done   DTaP/Tdap/Td vaccine (1 - Tdap) Never done   Colon Cancer Screening  Never done   Mammogram  Never done   Zoster (Shingles) Vaccine (1 of 2) Never done   Pneumonia Vaccine (1 of 1 - PCV) Never done   DEXA scan (bone density measurement)  Never done   COVID-19 Vaccine (3 - 2023-24 season) 12/13/2021   Hemoglobin A1C  10/16/2022   Yearly kidney health urinalysis for diabetes  04/18/2023   Complete foot exam   04/18/2023   Yearly kidney function blood test for diabetes  05/09/2023   Medicare Annual Wellness Visit  06/10/2023   Flu Shot  Completed   Hepatitis C Screening: USPSTF Recommendation to screen - Ages 18-79 yo.  Completed   HPV Vaccine  Aged Out    Advanced directives: Advance directive discussed with you today.   Conditions/risks identified: none  Next  appointment: Follow up in one year for your annual wellness visit    Preventive Care 65 Years and Older, Female Preventive care refers to lifestyle choices and visits with your health care provider that can promote health and wellness. What does preventive care include? A yearly physical exam. This is also called an annual well check. Dental exams once or twice a year. Routine eye exams. Ask your health care provider how often you should have your eyes checked. Personal lifestyle choices, including: Daily care of your teeth and gums. Regular physical activity. Eating a healthy diet. Avoiding tobacco and drug use. Limiting alcohol use. Practicing safe sex. Taking low-dose aspirin every day. Taking vitamin and mineral supplements as recommended by your health care provider. What happens during an annual well check? The services and screenings done by your health care  provider during your annual well check will depend on your age, overall health, lifestyle risk factors, and family history of disease. Counseling  Your health care provider may ask you questions about your: Alcohol use. Tobacco use. Drug use. Emotional well-being. Home and relationship well-being. Sexual activity. Eating habits. History of falls. Memory and ability to understand (cognition). Work and work Statistician. Reproductive health. Screening  You may have the following tests or measurements: Height, weight, and BMI. Blood pressure. Lipid and cholesterol levels. These may be checked every 5 years, or more frequently if you are over 61 years old. Skin check. Lung cancer screening. You may have this screening every year starting at age 71 if you have a 30-pack-year history of smoking and currently smoke or have quit within the past 15 years. Fecal occult blood test (FOBT) of the stool. You may have this test every year starting at age 2. Flexible sigmoidoscopy or colonoscopy. You may have a sigmoidoscopy every  5 years or a colonoscopy every 10 years starting at age 47. Hepatitis C blood test. Hepatitis B blood test. Sexually transmitted disease (STD) testing. Diabetes screening. This is done by checking your blood sugar (glucose) after you have not eaten for a while (fasting). You may have this done every 1-3 years. Bone density scan. This is done to screen for osteoporosis. You may have this done starting at age 46. Mammogram. This may be done every 1-2 years. Talk to your health care provider about how often you should have regular mammograms. Talk with your health care provider about your test results, treatment options, and if necessary, the need for more tests. Vaccines  Your health care provider may recommend certain vaccines, such as: Influenza vaccine. This is recommended every year. Tetanus, diphtheria, and acellular pertussis (Tdap, Td) vaccine. You may need a Td booster every 10 years. Zoster vaccine. You may need this after age 7. Pneumococcal 13-valent conjugate (PCV13) vaccine. One dose is recommended after age 31. Pneumococcal polysaccharide (PPSV23) vaccine. One dose is recommended after age 41. Talk to your health care provider about which screenings and vaccines you need and how often you need them. This information is not intended to replace advice given to you by your health care provider. Make sure you discuss any questions you have with your health care provider. Document Released: 04/27/2015 Document Revised: 12/19/2015 Document Reviewed: 01/30/2015 Elsevier Interactive Patient Education  2017 [place] Prevention in the Home Falls can cause injuries. They can happen to people of all ages. There are many things you can do to make your home safe and to help prevent falls. What can I do on the outside of my home? Regularly fix the edges of walkways and driveways and fix any cracks. Remove anything that might make you trip as you walk through a door, such as a raised  step or threshold. Trim any bushes or trees on the path to your home. Use bright outdoor lighting. Clear any walking paths of anything that might make someone trip, such as rocks or tools. Regularly check to see if handrails are loose or broken. Make sure that both sides of any steps have handrails. Any raised decks and porches should have guardrails on the edges. Have any leaves, snow, or ice cleared regularly. Use sand or salt on walking paths during winter. Clean up any spills in your garage right away. This includes oil or grease spills. What can I do in the bathroom? Use night lights. Install grab bars by the  toilet and in the tub and shower. Do not use towel bars as grab bars. Use non-skid mats or decals in the tub or shower. If you need to sit down in the shower, use a plastic, non-slip stool. Keep the floor dry. Clean up any water that spills on the floor as soon as it happens. Remove soap buildup in the tub or shower regularly. Attach bath mats securely with double-sided non-slip rug tape. Do not have throw rugs and other things on the floor that can make you trip. What can I do in the bedroom? Use night lights. Make sure that you have a light by your bed that is easy to reach. Do not use any sheets or blankets that are too big for your bed. They should not hang down onto the floor. Have a firm chair that has side arms. You can use this for support while you get dressed. Do not have throw rugs and other things on the floor that can make you trip. What can I do in the kitchen? Clean up any spills right away. Avoid walking on wet floors. Keep items that you use a lot in easy-to-reach places. If you need to reach something above you, use a strong step stool that has a grab bar. Keep electrical cords out of the way. Do not use floor polish or wax that makes floors slippery. If you must use wax, use non-skid floor wax. Do not have throw rugs and other things on the floor that can  make you trip. What can I do with my stairs? Do not leave any items on the stairs. Make sure that there are handrails on both sides of the stairs and use them. Fix handrails that are broken or loose. Make sure that handrails are as long as the stairways. Check any carpeting to make sure that it is firmly attached to the stairs. Fix any carpet that is loose or worn. Avoid having throw rugs at the top or bottom of the stairs. If you do have throw rugs, attach them to the floor with carpet tape. Make sure that you have a light switch at the top of the stairs and the bottom of the stairs. If you do not have them, ask someone to add them for you. What else can I do to help prevent falls? Wear shoes that: Do not have high heels. Have rubber bottoms. Are comfortable and fit you well. Are closed at the toe. Do not wear sandals. If you use a stepladder: Make sure that it is fully opened. Do not climb a closed stepladder. Make sure that both sides of the stepladder are locked into place. Ask someone to hold it for you, if possible. Clearly mark and make sure that you can see: Any grab bars or handrails. First and last steps. Where the edge of each step is. Use tools that help you move around (mobility aids) if they are needed. These include: Canes. Walkers. Scooters. Crutches. Turn on the lights when you go into a dark area. Replace any light bulbs as soon as they burn out. Set up your furniture so you have a clear path. Avoid moving your furniture around. If any of your floors are uneven, fix them. If there are any pets around you, be aware of where they are. Review your medicines with your doctor. Some medicines can make you feel dizzy. This can increase your chance of falling. Ask your doctor what other things that you can do to help prevent falls.  This information is not intended to replace advice given to you by your health care provider. Make sure you discuss any questions you have with  your health care provider. Document Released: 01/25/2009 Document Revised: 09/06/2015 Document Reviewed: 05/05/2014 Elsevier Interactive Patient Education  2017 [employer].

## 2022-06-11 ENCOUNTER — Ambulatory Visit: Payer: Medicare PPO | Admitting: Nurse Practitioner

## 2022-06-12 DIAGNOSIS — E1159 Type 2 diabetes mellitus with other circulatory complications: Secondary | ICD-10-CM

## 2022-06-12 DIAGNOSIS — I10 Essential (primary) hypertension: Secondary | ICD-10-CM

## 2022-06-16 ENCOUNTER — Other Ambulatory Visit: Payer: Self-pay | Admitting: Nurse Practitioner

## 2022-06-16 NOTE — Telephone Encounter (Signed)
LVM to return call. I wanted to ask about refill request.

## 2022-06-17 NOTE — Telephone Encounter (Signed)
I called patient and asked her if she requested refills of Amoxicillin and Clarithromycin and she said that she has not.

## 2022-06-19 ENCOUNTER — Encounter: Payer: Self-pay | Admitting: Nurse Practitioner

## 2022-06-19 ENCOUNTER — Ambulatory Visit (INDEPENDENT_AMBULATORY_CARE_PROVIDER_SITE_OTHER): Payer: Medicare PPO | Admitting: Nurse Practitioner

## 2022-06-19 VITALS — BP 138/60 | HR 80 | Temp 98.1°F | Ht 65.0 in | Wt 125.4 lb

## 2022-06-19 DIAGNOSIS — K250 Acute gastric ulcer with hemorrhage: Secondary | ICD-10-CM

## 2022-06-19 DIAGNOSIS — R42 Dizziness and giddiness: Secondary | ICD-10-CM

## 2022-06-19 MED ORDER — GABAPENTIN 100 MG PO CAPS: 100.0000 mg | ORAL_CAPSULE | Freq: Two times a day (BID) | ORAL | 1 refills | Status: DC

## 2022-06-19 MED ORDER — PANTOPRAZOLE SODIUM 40 MG PO TBEC: 40.0000 mg | DELAYED_RELEASE_TABLET | Freq: Two times a day (BID) | ORAL | 1 refills | Status: DC

## 2022-06-19 NOTE — Progress Notes (Signed)
Established Patient Office Visit  Subjective   Patient ID: Lisa [name], female    DOB: 05-04-1950  Age: 72 y.o. MRN: [id number]  Chief Complaint  Patient presents with   Dizziness    For 1 month    HPI  [facility] is here to follow up on gastric ulcers and dizziness.   She states that she has been taking the antibiotics without any problems.  She has not had any more dark stools that she has noticed.  She has an appointment with GI next month.  She needs a refill on her pantoprazole.  She has been experiencing dizziness for the last several years, however it has worsened over the last month.  She states that these spells come on randomly and she will need to lay down and wait for them to pass.  She noticed that recently as well she will lose hearing when she gets these dizzy spells.  She denies shortness of breath, chest pain.    ROS See pertinent positives and negatives per HPI.    Objective:     BP 138/60   Pulse 80   Temp 98.1 F (36.7 C)   Ht '5\' 5"'$  (1.651 m)   Wt 125 lb 6.4 oz (56.9 kg)   SpO2 96%   BMI 20.87 kg/m    Physical Exam Vitals and nursing note reviewed.  Constitutional:      General: She is not in acute distress.    Appearance: Normal appearance.  HENT:     Head: Normocephalic.     Right Ear: Tympanic membrane, ear canal and external ear normal.     Left Ear: Tympanic membrane, ear canal and external ear normal.  Eyes:     Conjunctiva/sclera: Conjunctivae normal.  Cardiovascular:     Rate and Rhythm: Normal rate and regular rhythm.     Pulses: Normal pulses.     Heart sounds: Normal heart sounds.  Pulmonary:     Effort: Pulmonary effort is normal.     Breath sounds: Normal breath sounds.  Musculoskeletal:     Cervical back: Normal range of motion.  Skin:    General: Skin is warm.  Neurological:     General: No focal deficit present.     Mental Status: She is alert and oriented to person, place, and time.     Comments:  Negative dix hallpike maneuver  Psychiatric:        Mood and Affect: Mood normal.        Behavior: Behavior normal.        Thought Content: Thought content normal.        Judgment: Judgment normal.      Assessment & Plan:   Problem List Items Addressed This Visit       Cardiovascular and Mediastinum   Acute gastric ulcer with hemorrhage    She has not had any more bleeding since being discharged from the hospital.  She did test positive for H. pylori and is currently taking clarithromycin 500 mg twice a day and amoxicillin 500 mg twice a day for 14 days.  She needs a refill on her pantoprazole, will send this in recommend that she continue pantoprazole 40 mg twice a day until she finishes her antibiotic and then she can go to once a day.  Keep appointment with GI.        Other   Dizziness - Primary    She has been experiencing dizzy spells that have been occurring more  frequently.  They started a few years ago but have worsened over the last month.  Dix-Hallpike maneuver negative, orthostatics negative.  With loss of hearing during the spells, concern for Mnire's.  Will place referral to ENT.      Relevant Orders   Ambulatory referral to ENT    Return in about 2 months (around 08/19/2022) for follow up.    Charyl Dancer, NP

## 2022-06-19 NOTE — Patient Instructions (Signed)
It was great to see you!  Increase your gabapentin to twice a day for your hands and feet.   I am referring you to a specialist for your dizziness.   Keep taking the antibiotics until you finish them.  Let's follow-up in 2 months, sooner if you have concerns.  If a referral was placed today, you will be contacted for an appointment. Please note that routine referrals can sometimes take up to 3-4 weeks to process. Please call our office if you haven't heard anything after this time frame.  Take care,  Vance Peper, NP

## 2022-06-19 NOTE — Assessment & Plan Note (Signed)
She has been experiencing dizzy spells that have been occurring more frequently.  They started a few years ago but have worsened over the last month.  Dix-Hallpike maneuver negative, orthostatics negative.  With loss of hearing during the spells, concern for Mnire's.  Will place referral to ENT.

## 2022-06-19 NOTE — Assessment & Plan Note (Signed)
She has not had any more bleeding since being discharged from the hospital.  She did test positive for H. pylori and is currently taking clarithromycin 500 mg twice a day and amoxicillin 500 mg twice a day for 14 days.  She needs a refill on her pantoprazole, will send this in recommend that she continue pantoprazole 40 mg twice a day until she finishes her antibiotic and then she can go to once a day.  Keep appointment with GI.

## 2022-06-29 ENCOUNTER — Other Ambulatory Visit: Payer: Self-pay | Admitting: Nurse Practitioner

## 2022-07-07 ENCOUNTER — Other Ambulatory Visit: Payer: Medicare PPO

## 2022-07-16 NOTE — Progress Notes (Unsigned)
Assessment   72 y.o. yo female with the following:   Recent upper GI bleed on plavix , secondary to H.pylori related PUD No further bleeding. H.pylori breath test by PCP in February was positive . Completed course of biaxin and Amoxicillin.    Acute blood loss anemia Hgb stabilized at 10. 5 after one unit of blood  Colon cancer screening.  Patient says she has never undergone any type of colon cancer screening.   Heart failure, EF 20-30%.  On Entresto   Hx of CVA, on Plavix.  No longer on asa since GI bleed.   Plan    -Decrease pantoprazole to 40 mg once daily  -Schedule for follow up EGD to document ulcer healing. The risks and benefits of EGD with possible biopsies were discussed with the patient who agrees to proceed. Procedure will need to be done at hospital due to CHF with low EF% -Schedule for screening colonoscopy to be done at time of EGD. The risks and benefits of colonoscopy with possible polypectomy / biopsies were discussed and the patient agrees to proceed.  -Hold Plavix for 5 days before procedure - will instruct when and how to resume after procedure. Patient understands that there is a low but real risk of cardiovascular event such as heart attack, stroke, or embolism /  thrombosis, or ischemia while off Plavix. The patient consents to proceed. Will communicate by phone or EMR with patient's prescribing provider to confirm that holding Plavix is reasonable in this case.  -Biopsy might be done at time of EGD to check for H.pylori eradication. If not then will need to get breath or stool test.   History of Present Illness   Chief complaint:  hospital follow up   Lisa [name] is a 72 y.o. female known to Dr. Havery Moros ( recent hospitalization) with a past medical history of CVA on plavix , chronic systolic heart failure with EF of 25-30%, moderate pulmonary hypertension, DM, H.pylori related PUD. See PMH / [facility] for additional details.   Patient is a limited  historian. She was seen by [place] in the hospital late January 2024 for hematemesis on plavix + asa.  Presenting hemoglobin was 9.7,  down from baseline of 12.3.  She received a unit of blood, hemoglobin stabilized at 10.5 . Inpatient EGD remarkable for 5 non-bleeding gastric ulcers.   Also had gastritis  /atrophic mucosa.  She was discharged on twice daily PPI.  We recommended outpatient H. pylori testing.  Interval History:  No further nausea, vomiting or hematemesis.  No black stool.  No abdominal pain.  PCP checked H. pylori breath test in February and it was positive.  Patient completed a course of Biaxin and amoxicillin.  She is still taking twice daily pantoprazole.  She is back on Plavix but no longer on aspirin   Labs:     Latest Ref Rng & Units 05/08/2022    3:03 PM 05/04/2022   10:18 AM 04/17/2022   11:10 AM  Hepatic Function  Total Protein 6.5 - 8.1 g/dL 6.5  5.7  7.2   Albumin 3.5 - 5.0 g/dL 3.5  3.0  4.2   AST 15 - 41 U/L 16  13  13    ALT 0 - 44 U/L 10  7  9    Alk Phosphatase 38 - 126 U/L 50  41  74   Total Bilirubin 0.3 - 1.2 mg/dL 0.5  0.6  0.4        Latest Ref  Rng & Units 05/22/2022   10:37 AM 05/08/2022    3:03 PM 05/05/2022    4:57 PM  CBC  WBC 4.0 - 10.5 K/uL 7.6  5.4  6.5   Hemoglobin 12.0 - 15.0 g/dL 10.5  9.4  9.6   Hematocrit 36.0 - 46.0 % 30.7  27.6  27.4   Platelets 150.0 - 400.0 K/uL 271.0  169  147      Previous GI Evaluation   EGD Jan 2024  - Esophagogastric landmarks identified. - Normal esophagus otherwise - 5 gastric ulcers - 3 pre-pyloric area, 2 gastric body - all small - no active bleeding - Gastritis / atrophic gastric mucosa. - Normal stomach otherwise - Normal examined duodenum. Small gastric ulcers were the cause of her symptoms.   Past Medical History:  Diagnosis Date   ARF (acute renal failure) ([place]) 11/18/2018   Diabetes mellitus ([place]) 2007   High cholesterol 2007   Hypertension    Peptic ulcer 1965   Stroke [facility])     Past Surgical  History:  Procedure Laterality Date   CESAREAN SECTION     ESOPHAGOGASTRODUODENOSCOPY (EGD) WITH PROPOFOL N/A 05/04/2022   Procedure: ESOPHAGOGASTRODUODENOSCOPY (EGD) WITH PROPOFOL;  Surgeon: Yetta Flock, MD;  Location: [facility];  Service: Gastroenterology;  Laterality: N/A;   RIGHT/LEFT HEART CATH AND CORONARY ANGIOGRAPHY N/A 11/19/2018   Procedure: RIGHT/LEFT HEART CATH AND CORONARY ANGIOGRAPHY;  Surgeon: Dixie Dials, MD;  Location: [facility];  Service: Cardiovascular;  Laterality: N/A;    Current Medications, Allergies, Family History and Social History were reviewed in [employer] record.     Current Outpatient Medications  Medication Sig Dispense Refill   Accu-Chek Softclix Lancets lancets Use as instructed 100 each 12   acetaminophen (TYLENOL) 500 MG tablet Take 1,000 mg by mouth every 6 (six) hours as needed for moderate pain or mild pain.     Alcohol Swabs (DROPSAFE ALCOHOL PREP) 70 % PADS Apply topically.     amLODipine (NORVASC) 5 MG tablet Take 1 tablet (5 mg total) by mouth daily.     atorvastatin (LIPITOR) 40 MG tablet Take 40 mg by mouth daily.     Blood Glucose Monitoring Suppl (ACCU-CHEK AVIVA PLUS) w/Device KIT 1 each by Does not apply route daily. 1 kit 0   carvedilol (COREG) 12.5 MG tablet Take 0.5 tablets (6.25 mg total) by mouth 2 (two) times daily with a meal.     citalopram (CELEXA) 20 MG tablet Take 1 tablet (20 mg total) by mouth daily. 90 tablet 1   clarithromycin (BIAXIN) 500 MG tablet Take 1 tablet (500 mg total) by mouth 2 (two) times daily. 28 tablet 0   clopidogrel (PLAVIX) 75 MG tablet Take 1 tablet (75 mg total) by mouth daily.     diclofenac Sodium (VOLTAREN) 1 % GEL APPLY 2 GRAMS TOPICALLY FOUR TIMES DAILY (Patient taking differently: Apply 2 g topically 4 (four) times daily as needed (pain).) 400 g 3   gabapentin (NEURONTIN) 100 MG capsule Take 1 capsule (100 mg total) by mouth 2 (two) times daily. 180 capsule 1    glucose blood (ACCU-CHEK AVIVA PLUS) test strip Use as instructed 100 each 12   glucose blood (ONETOUCH ULTRA) test strip Use as instructed 100 each 12   melatonin 3 MG TABS tablet TAKE 1 TABLET AT BEDTIME 90 tablet 3   metFORMIN (GLUCOPHAGE) 500 MG tablet Take 250 mg by mouth 2 (two) times daily with a meal.  ondansetron (ZOFRAN) 4 MG tablet TAKE 1 TABLET EVERY 8 HOURS AS NEEDED FOR NAUSEA OR VOMITING 90 tablet 0   pantoprazole (PROTONIX) 40 MG tablet Take 1 tablet (40 mg total) by mouth 2 (two) times daily. 180 tablet 1   sacubitril-valsartan (ENTRESTO) 49-51 MG Take 1 tablet by mouth 2 (two) times daily.     No current facility-administered medications for this visit.    Review of Systems: No chest pain. No shortness of breath. No urinary complaints.    Physical Exam  Wt Readings from Last 3 Encounters:  06/19/22 125 lb 6.4 oz (56.9 kg)  06/09/22 128 lb 1.9 oz (58.1 kg)  05/22/22 127 lb 12.8 oz (58 kg)    BP (!) 140/80   Pulse 82   Ht 5\' 5"  (1.651 m)   Wt 129 lb (58.5 kg)   BMI 21.47 kg/m  Constitutional:  Pleasant, generally well appearing female in no acute distress. Psychiatric: Normal mood and affect. Behavior is normal. EENT: Pupils normal.  Conjunctivae are normal. No scleral icterus. Neck supple.  Cardiovascular: Normal rate, regular rhythm.  Pulmonary/chest: Effort normal and breath sounds normal. No wheezing, rales or rhonchi. Abdominal: Soft, nondistended, nontender. Bowel sounds active throughout. There are no masses palpable. No hepatomegaly. Neurological: Alert and oriented to person place and time.   Tye Savoy, NP  07/16/2022, 4:51 PM  Cc:  Charyl Dancer, NP

## 2022-07-17 ENCOUNTER — Encounter: Payer: Self-pay | Admitting: Nurse Practitioner

## 2022-07-17 ENCOUNTER — Telehealth: Payer: Self-pay

## 2022-07-17 ENCOUNTER — Telehealth: Payer: Self-pay | Admitting: Nurse Practitioner

## 2022-07-17 ENCOUNTER — Ambulatory Visit (INDEPENDENT_AMBULATORY_CARE_PROVIDER_SITE_OTHER): Payer: Medicare PPO | Admitting: Nurse Practitioner

## 2022-07-17 VITALS — BP 140/80 | HR 82 | Ht 65.0 in | Wt 129.0 lb

## 2022-07-17 DIAGNOSIS — K279 Peptic ulcer, site unspecified, unspecified as acute or chronic, without hemorrhage or perforation: Secondary | ICD-10-CM | POA: Diagnosis not present

## 2022-07-17 DIAGNOSIS — I509 Heart failure, unspecified: Secondary | ICD-10-CM

## 2022-07-17 DIAGNOSIS — B9681 Helicobacter pylori [H. pylori] as the cause of diseases classified elsewhere: Secondary | ICD-10-CM | POA: Diagnosis not present

## 2022-07-17 DIAGNOSIS — D62 Acute posthemorrhagic anemia: Secondary | ICD-10-CM

## 2022-07-17 DIAGNOSIS — K922 Gastrointestinal hemorrhage, unspecified: Secondary | ICD-10-CM

## 2022-07-17 DIAGNOSIS — Z8673 Personal history of transient ischemic attack (TIA), and cerebral infarction without residual deficits: Secondary | ICD-10-CM

## 2022-07-17 DIAGNOSIS — Z7901 Long term (current) use of anticoagulants: Secondary | ICD-10-CM | POA: Diagnosis not present

## 2022-07-17 DIAGNOSIS — Z1211 Encounter for screening for malignant neoplasm of colon: Secondary | ICD-10-CM

## 2022-07-17 NOTE — Patient Instructions (Addendum)
You have been scheduled for a colonoscopy. Please follow written instructions given to you at your visit today.  Please pick up your prep supplies at the pharmacy within the next 1-3 days. If you use inhalers (even only as needed), please bring them with you on the day of your procedure.  Take your pantoprazole to once daily. _______________________________________________________  If your blood pressure at your visit was 140/90 or greater, please contact your primary care physician to follow up on this.  _______________________________________________________  If you are age 68 or older, your body mass index should be between 23-30. Your Body mass index is 21.47 kg/m. If this is out of the aforementioned range listed, please consider follow up with your Primary Care Provider.  If you are age 23 or younger, your body mass index should be between 19-25. Your Body mass index is 21.47 kg/m. If this is out of the aformentioned range listed, please consider follow up with your Primary Care Provider.   ________________________________________________________  The [facility] providers would like to encourage you to use [facility] to communicate with providers for non-urgent requests or questions.  Due to long hold times on the telephone, sending your provider a message by [facility] may be a faster and more efficient way to get a response.  Please allow 48 business hours for a response.  Please remember that this is for non-urgent requests.  _______________________________________________________ It was a pleasure to see you today!  Thank you for trusting me with your gastrointestinal care!

## 2022-07-17 NOTE — Telephone Encounter (Signed)
We saw Lisa [name] in the office today and she told us you were monitoring her Plavix, if you are not monitoring her Plavix do you know who is so we can reach out to get conformation if we can hold it for her?

## 2022-07-17 NOTE — Telephone Encounter (Signed)
Thank you :)

## 2022-07-17 NOTE — Telephone Encounter (Signed)
[facility] Pre-operative Risk Assessment     Request for surgical clearance:     Endoscopy Procedure  What type of surgery is being performed?     Colon/ egd  When is this surgery scheduled?     09/29/2022  What type of clearance is required ?   Pharmacy  Are there any medications that need to be held prior to surgery and how long? 5 days plavix   Practice name and name of physician performing surgery?      [facility]  What is your office phone and fax number?      Phone- [phone]  [phone]  Anesthesia type (None, local, MAC, general) ?       MAC

## 2022-07-17 NOTE — Telephone Encounter (Signed)
We are trying to get clearance for plavix and it seems like you are the one monitoring her plavix

## 2022-07-17 NOTE — Telephone Encounter (Signed)
Patient is calling states she was told to give Nevin Bloodgood her cardiologist's name, Dr Dixie Dials.

## 2022-07-18 ENCOUNTER — Telehealth: Payer: Self-pay

## 2022-07-18 ENCOUNTER — Ambulatory Visit (INDEPENDENT_AMBULATORY_CARE_PROVIDER_SITE_OTHER): Payer: Medicare PPO

## 2022-07-18 ENCOUNTER — Telehealth: Payer: Self-pay | Admitting: *Deleted

## 2022-07-18 ENCOUNTER — Telehealth: Payer: Medicare PPO

## 2022-07-18 DIAGNOSIS — E119 Type 2 diabetes mellitus without complications: Secondary | ICD-10-CM

## 2022-07-18 DIAGNOSIS — I10 Essential (primary) hypertension: Secondary | ICD-10-CM

## 2022-07-18 NOTE — Telephone Encounter (Signed)
[facility] Pre-operative Risk Assessment     Request for surgical clearance:     Endoscopy Procedure  What type of surgery is being performed?     Egd/ colon  When is this surgery scheduled?     09/29/2022  What type of clearance is required ?   Pharmacy  Are there any medications that need to be held prior to surgery and how long? Plavix- 5 days   Practice name and name of physician performing surgery?      [facility]  What is your office phone and fax number?      Phone- [phone]  Fax- [phone]  Anesthesia type (None, local, MAC, general) ?       MAC

## 2022-07-18 NOTE — Progress Notes (Signed)
  Care Coordination Note  07/18/2022 Name: Lisa [name] MRN: [id number] DOB: 1951/03/16  Lisa [name] is a 72 y.o. year old female who is a primary care patient of [name], [name], NP and is actively engaged with the care management team. I reached out to [facility] by phone today to assist with re-scheduling a follow up visit with the BSW  Follow up plan: Telephone appointment with care management team member scheduled for: 07/22/2022  Burman Nieves, [facility] Care Coordination Care Guide Direct Dial: [phone]

## 2022-07-18 NOTE — Telephone Encounter (Signed)
   Telephone encounter was:  Successful.  07/18/2022 Name: [name] MRN: [id number] DOB: 11-Jul-1950  Lisa [name] is a 72 y.o. year old female who is a primary care patient of [name], [name], NP . The community resource team was consulted for assistance with Transportation Needs  and Food Insecurity  Care guide performed the following interventions: Patient provided with information about care guide support team and interviewed to confirm resource needs.Patient lives alone and does not drive she would like to be added to the Meals on wheels wait list and I gave her information about transportation I will be adding referrals for food banks and food pantries   Follow Up Plan:  Care guide will follow up with patient by phone over the next week    Lenard Forth [facility] Guide, [facility] [phone] [address] [place], [place], [place] [address] Phone: [phone] Email: [email or web address]

## 2022-07-18 NOTE — Progress Notes (Signed)
Agree with assessment and plan as outlined. Will schedule EGD and colonoscopy to be done at the hospital - will need to hold Plavix for 5 days prior to that exam.   [place] - can you see if she would be able to do these next week if that opening in the hospital schedule is still there for me? If not, will put in next available slot. I have added her to hospital wait list. Thanks

## 2022-07-18 NOTE — Patient Instructions (Signed)
Please call the care guide team at [phone] if you need to cancel or reschedule your appointment.   If you are experiencing a Mental Health or Behavioral Health Crisis or need someone to talk to, please call the Suicide and Crisis Lifeline: 988 call the [place] National Suicide Prevention Lifeline: 445-720-1301 or TTY: (361) 792-4262 TTY 5095931614) to talk to a trained counselor call 1-800-273-TALK (toll free, 24 hour hotline) go to [facility] [address], [place] [phone])   Following is a copy of the CCM Program Consent:  CCM service includes personalized support from designated clinical staff supervised by the physician, including individualized plan of care and coordination with other care providers 24/7 contact phone numbers for assistance for urgent and routine care needs. Service will only be billed when office clinical staff spend 20 minutes or more in a month to coordinate care. Only one practitioner may furnish and bill the service in a calendar month. The patient may stop CCM services at amy time (effective at the end of the month) by phone call to the office staff. The patient will be responsible for cost sharing (co-pay) or up to 20% of the service fee (after annual deductible is met)  Following is a copy of your full provider care plan:   Goals Addressed             This Visit's Progress    CCM Expected Outcome:  Monitor, Self-Manage and Reduce Symptoms of Diabetes       Current Barriers:  Knowledge Deficits related to the need to check blood sugars on a regular basis, preventive care needs, and importance of following recommendations by the provider for effective management of DM and other chronic conditions Care Coordination needs related to resources the patient needs for senior housing so she can have a safe place to live without fear of being asked to move out in a patient with DM Chronic Disease Management support and  education needs related to effective management of DM Lacks caregiver support.  Corporate treasurer.  Lab Results  Component Value Date   HGBA1C 5.8 04/17/2022     Planned Interventions: Provided education to patient about basic DM disease process. Review and education. The patient has multiple chronic conditions impacting her care. Her DM are stable. Discussed changes to monitor for and when to call the provider.; Reviewed medications with patient and discussed importance of medication adherence. 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 Denies any swelling. States compliance with medications. The patient took all of her medications with her to her provider visit on 05-22-2022. Review and adjustments made. Will continue to monitor for changes;        Reviewed prescribed diet with patient heart healthy/ADA diet. Education and support given. The patient states she is staying away for spicy foods. Is taking protonix and also has medications for nausea if she needs them. She says she has only had to take a couple of times. Saw GI specialist yesterday. Will have an EGD and Colonoscopy in June ; Counseled on importance of regular laboratory monitoring as prescribed. Has regular lab testing;        Discussed plans with patient for ongoing care management follow up and provided patient with direct contact information for care management team;      Provided patient with written educational materials related to hypo and hyperglycemia and importance of correct treatment. Denies  any drops in her blood sugars or highs. Will continue to monitor;       Reviewed scheduled/upcoming provider appointments including: 09-01-2022 at 0900 am;         New referral made to social work team for follow up assistance. The patient is in her own apartment now and could utilize some resources and overall SW support.  In basket message sent to  the scheduler for SW support scheduling. The patient [facility] new needs or concerns;      Review of patient status, including review of consultants reports, relevant laboratory and other test results, and medications completed;       Advised patient to discuss changes in her DM, Questions, and concerns with provider;      Screening for signs and symptoms of depression related to chronic disease state;        Assessed social determinant of health barriers;      Scheduled for mammogram and bone density test on 06-09-2022, gave her the number to the mobile unit today. It will be at [place] on the 26th.    Symptom Management: Take medications as prescribed   Attend all scheduled provider appointments Call provider office for new concerns or questions  call the Suicide and Crisis Lifeline: 988 call the [place] National Suicide Prevention Lifeline: 234-225-99601-726-805-1659 or TTY: 41522165301-800-799-4 TTY (816)702-4068(1-(574)878-9292) to talk to a trained counselor call 1-800-273-TALK (toll free, 24 hour hotline) if experiencing a Mental Health or Behavioral Health Crisis  schedule appointment with eye doctor check feet daily for cuts, sores or redness trim toenails straight across wash and dry feet carefully every day wear comfortable, cotton socks wear comfortable, well-fitting shoes  Follow Up Plan: Telephone follow up appointment with care management team member scheduled for: 09-01-2022 at 0900 am       CCM Expected Outcome:  Monitor, Self-Manage, and Reduce Symptoms of Hypertension       Current Barriers:  Knowledge Deficits related to the benefits of taking blood pressures on a regular basis to monitor changes in blood pressures and decrease risk of heart attack and stroke Care Coordination needs related to educational needs for a patient with HTN and other chronic conditions and resources in the community to help patient with housing needs  in a patient with HTN Chronic Disease Management support and  education needs related to effective management of HTN Lacks caregiver support.  Needs stable housing, currently lives with her cousin but needs to find a place of her own. Asking for help with resources- 07-18-2022- Is in her own apartment now  BP Readings from Last 3 Encounters:  07/17/22 (!) 140/80  06/19/22 138/60  05/22/22 (!) 162/74     Planned Interventions: Evaluation of current treatment plan related to hypertension self management and patient's adherence to plan as established by provider.The patient is doing better with her blood pressures. She is not as stressed as she is in her own apartment now and this is making her very happy. She will have an EDG and Colonoscopy in June. The patient is thankful for the support of the team. Her blood pressure are stable at this time.  Provided education to patient re: stroke prevention, s/s of heart attack and stroke. The patient has a history of stroke and the patient mindful of the impact of uncontrolled blood pressures and risk of HA and stroke; Reviewed prescribed diet heart healthy/ADA diet- the patient states she is mindful of her dietary restrictions and she monitors  her sodium and sugar intake. She also is monitoring for spicy foods now and knows she needs to watch her intake closely due to the recent hospitalization with finding ulcers present. The patient states today that she is doing "real good" because she "loves herself".  She states that she could use some help with food resources as she has to wait next week to get food because that is when she gets her check. The patient is receptive to talking to the care guides. Referral placed for support and help with food resources in her area.  Reviewed medications with patient and discussed importance of compliance. The patient states that she does have her medications and she is taking her medications as directed. Has her medications and is compliant with medications. The patient is back on her  hydralazine 25 mg BID, Entresto 49-51 mg BID, Amlodipine 5 mg QD and Carvedilol 6.25 mg BID. She is also still taking her Protonix 40 mg QD now.   Discussed plans with patient for ongoing care management follow up and provided patient with direct contact information for care management team; Advised patient, providing education and rationale, to monitor blood pressure daily and record, calling PCP for findings outside established parameters;  Reviewed scheduled/upcoming provider appointments including: saw pcp on 06-19-2022 and will follow up again on 08-21-2022 at 1020 with the pcp Advised patient to discuss changes in her blood pressures or questions/Concerns about her heart health with provider; Provided education on prescribed diet heart healthy/ADA diet. Review and education. Also discussed avoiding spicy foods that could aggravate the presence of ulcers ;  Discussed complications of poorly controlled blood pressure such as heart disease, stroke, circulatory complications, vision complications, kidney impairment, sexual dysfunction;  Screening for signs and symptoms of depression related to chronic disease state;  Assessed social determinant of health barriers;   Symptom Management: Take medications as prescribed   Attend all scheduled provider appointments Call provider office for new concerns or questions  call the Suicide and Crisis Lifeline: 988 call the [place] National Suicide Prevention Lifeline: 579-486-22241-931-441-0905 or TTY: (737)505-69531-800-799-4 TTY (514)790-4915(1-986-713-3874) to talk to a trained counselor call 1-800-273-TALK (toll free, 24 hour hotline) if experiencing a Mental Health or Behavioral Health Crisis  check blood pressure weekly learn about high blood pressure call doctor for signs and symptoms of high blood pressure develop an action plan for high blood pressure keep all doctor appointments take medications for blood pressure exactly as prescribed report new symptoms to your doctor  Follow Up Plan:  Telephone follow up appointment with care management team member scheduled for: 09-01-2022 at 0900 am          The patient verbalized understanding of instructions, educational materials, and care plan provided today and DECLINED offer to receive copy of patient instructions, educational materials, and care plan.   Telephone follow up appointment with care management team member scheduled for: 09-01-2022 at 0900 am

## 2022-07-18 NOTE — Progress Notes (Signed)
Dr. Adela Lank, they are still in the process of obtaining cardiac clearance. They have already scheduled pt for EGD/colon on 09/29/22. Not sure if clearance will be obtained in time for procedure on 07/24/22.

## 2022-07-18 NOTE — Chronic Care Management (AMB) (Signed)
Chronic Care Management   CCM RN Visit Note  07/18/2022 Name: Lisa [name] MRN: [id number] DOB: [birth year]  Subjective: Lisa [name] is [name] 72 y.o. year old female who is [name] primary care patient of [name], [name], NP. The patient was referred to the Chronic Care Management team for assistance with care management needs subsequent to provider initiation of CCM services and plan of care.    Today's Visit:  Engaged with patient by telephone for follow up visit.     SDOH Interventions Today    Flowsheet Row Most Recent Value  SDOH Interventions   Food Insecurity Interventions Other (Comment), AMB Referral  [the patient needs food resources as she lives in her own apartment now]  Transportation Interventions Intervention Not Indicated, Other (Comment)  [careguide referral as the patient needs resources during the day for transportation]         Goals Addressed             This Visit's Progress    CCM Expected Outcome:  Monitor, Self-Manage and Reduce Symptoms of Diabetes       Current Barriers:  Knowledge Deficits related to the need to check blood sugars on [name] regular basis, preventive care needs, and importance of following recommendations by the provider for effective management of DM and other chronic conditions Care Coordination needs related to resources the patient needs for senior housing so she can have [name] safe place to live without fear of being asked to move out in [name] patient with DM Chronic Disease Management support and education needs related to effective management of DM Lacks caregiver support.  Corporate treasurer.  Lab Results  Component Value Date   HGBA1C 5.8 04/17/2022     Planned Interventions: Provided education to patient about basic DM disease process. Review and education. The patient has multiple chronic conditions impacting her care. Her DM are stable. Discussed changes to monitor for and when to call the provider.; Reviewed medications with  patient and discussed importance of medication adherence. 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 Denies any swelling. States compliance with medications. The patient took all of her medications with her to her provider visit on 05-22-2022. Review and adjustments made. Will continue to monitor for changes;        Reviewed prescribed diet with patient heart healthy/ADA diet. Education and support given. The patient states she is staying away for spicy foods. Is taking protonix and also has medications for nausea if she needs them. She says she has only had to take [name] couple of times. Saw GI specialist yesterday. Will have an EGD and Colonoscopy in June ; Counseled on importance of regular laboratory monitoring as prescribed. Has regular lab testing;        Discussed plans with patient for ongoing care management follow up and provided patient with direct contact information for care management team;      Provided patient with written educational materials related to hypo and hyperglycemia and importance of correct treatment. Denies any drops in her blood sugars or highs. Will continue to monitor;       Reviewed scheduled/upcoming provider appointments including: 09-01-2022 at 0900 am;         New referral made to social work team for follow up assistance. The patient is in her own apartment now and could utilize some resources and overall SW support.  In basket message sent  to the scheduler for SW support scheduling. The patient [facility] of new needs or concerns;      Review of patient status, including review of consultants reports, relevant laboratory and other test results, and medications completed;       Advised patient to discuss changes in her DM, Questions, and concerns with provider;      Screening for signs and symptoms of depression related to chronic disease state;        Assessed social  determinant of health barriers;      Scheduled for mammogram and bone density test on 06-09-2022, gave her the number to the mobile unit today. It will be at [place] on the 26th.    Symptom Management: Take medications as prescribed   Attend all scheduled provider appointments Call provider office for new concerns or questions  call the Suicide and Crisis Lifeline: 988 call the [place] National Suicide Prevention Lifeline: 442-081-08891-574-729-5876 or TTY: 226 710 70851-800-799-4 TTY 423-281-7486(1-(304)432-3780) to talk to [name] trained counselor call 1-800-273-TALK (toll free, 24 hour hotline) if experiencing [name] Mental Health or Behavioral Health Crisis  schedule appointment with eye doctor check feet daily for cuts, sores or redness trim toenails straight across wash and dry feet carefully every day wear comfortable, cotton socks wear comfortable, well-fitting shoes  Follow Up Plan: Telephone follow up appointment with care management team member scheduled for: 09-01-2022 at 0900 am       CCM Expected Outcome:  Monitor, Self-Manage, and Reduce Symptoms of Hypertension       Current Barriers:  Knowledge Deficits related to the benefits of taking blood pressures on [name] regular basis to monitor changes in blood pressures and decrease risk of heart attack and stroke Care Coordination needs related to educational needs for [name] patient with HTN and other chronic conditions and resources in the community to help patient with housing needs  in [name] patient with HTN Chronic Disease Management support and education needs related to effective management of HTN Lacks caregiver support.  Needs stable housing, currently lives with her cousin but needs to find [name] place of her own. Asking for help with resources- 07-18-2022- Is in her own apartment now  BP Readings from Last 3 Encounters:  07/17/22 (!) 140/80  06/19/22 138/60  05/22/22 (!) 162/74     Planned Interventions: Evaluation of current treatment plan related to hypertension self management  and patient's adherence to plan as established by provider.The patient is doing better with her blood pressures. She is not as stressed as she is in her own apartment now and this is making her very happy. She will have an EDG and Colonoscopy in June. The patient is thankful for the support of the team. Her blood pressure are stable at this time.  Provided education to patient re: stroke prevention, s/s of heart attack and stroke. The patient has [name] history of stroke and the patient mindful of the impact of uncontrolled blood pressures and risk of HA and stroke; Reviewed prescribed diet heart healthy/ADA diet- the patient states she is mindful of her dietary restrictions and she monitors her sodium and sugar intake. She also is monitoring for spicy foods now and knows she needs to watch her intake closely due to the recent hospitalization with finding ulcers present. The patient states today that she is doing "real good" because she "loves herself".  She states that she could use some help with food resources as she has to wait next week to get food because  that is when she gets her check. The patient is receptive to talking to the care guides. Referral placed for support and help with food resources in her area.  Reviewed medications with patient and discussed importance of compliance. The patient states that she does have her medications and she is taking her medications as directed. Has her medications and is compliant with medications. The patient is back on her hydralazine 25 mg BID, Entresto 49-51 mg BID, Amlodipine 5 mg QD and Carvedilol 6.25 mg BID. She is also still taking her Protonix 40 mg QD now.   Discussed plans with patient for ongoing care management follow up and provided patient with direct contact information for care management team; Advised patient, providing education and rationale, to monitor blood pressure daily and record, calling PCP for findings outside established parameters;   Reviewed scheduled/upcoming provider appointments including: saw pcp on 06-19-2022 and will follow up again on 08-21-2022 at 1020 with the pcp Advised patient to discuss changes in her blood pressures or questions/Concerns about her heart health with provider; Provided education on prescribed diet heart healthy/ADA diet. Review and education. Also discussed avoiding spicy foods that could aggravate the presence of ulcers ;  Discussed complications of poorly controlled blood pressure such as heart disease, stroke, circulatory complications, vision complications, kidney impairment, sexual dysfunction;  Screening for signs and symptoms of depression related to chronic disease state;  Assessed social determinant of health barriers;   Symptom Management: Take medications as prescribed   Attend all scheduled provider appointments Call provider office for new concerns or questions  call the Suicide and Crisis Lifeline: 988 call the [place] National Suicide Prevention Lifeline: 734 607 14681-647 395 7142 or TTY: 502-759-97961-800-799-4 TTY (443) 460-1925(1-657-401-9448) to talk to [name] trained counselor call 1-800-273-TALK (toll free, 24 hour hotline) if experiencing [name] Mental Health or Behavioral Health Crisis  check blood pressure weekly learn about high blood pressure call doctor for signs and symptoms of high blood pressure develop an action plan for high blood pressure keep all doctor appointments take medications for blood pressure exactly as prescribed report new symptoms to your doctor  Follow Up Plan: Telephone follow up appointment with care management team member scheduled for: 09-01-2022 at 0900 am          Plan:Telephone follow up appointment with care management team member scheduled for:  09-01-2022 at 0900 am  Alto DenverPam Astria Jordahl RN, MSN, CCM RN Care Manager  Chronic Care Management Direct Number: [phone]

## 2022-07-20 NOTE — Progress Notes (Signed)
Okay. I see she is scheduled at the hospital now in June. Did her case for this week get cancelled due to this? Hopefully she can see her cardiologist in the next 1-2 months prior to this exam. Thanks

## 2022-07-22 ENCOUNTER — Ambulatory Visit: Payer: Self-pay

## 2022-07-22 ENCOUNTER — Telehealth: Payer: Self-pay

## 2022-07-22 NOTE — Telephone Encounter (Signed)
   Telephone encounter was:  Successful.  07/22/2022 Name: Zhaniyah Searing MRN: [id number] DOB: 1950/08/29  Derick Avitia Viscusi is a 72 y.o. year old female who is a primary care patient of [name], [name], NP . The community resource team was consulted for assistance with Transportation Needs   Care guide performed the following interventions: Patient provided with information about care guide support team and interviewed to confirm resource needs. Patient has transportation needs for 5/9 and will call me back to set up ride  Follow Up Plan:  No further follow up planned at this time. The patient has been provided with needed resources.   Lenard Forth [facility] Guide, [facility] [phone] [address] [place], [place], [place] [address] Phone: [phone] Email: [email or web address]

## 2022-07-22 NOTE — Telephone Encounter (Signed)
Pharmacy doesn't clear plavix

## 2022-07-22 NOTE — Telephone Encounter (Signed)
    Primary Cardiologist:None  Chart reviewed as part of pre-operative protocol coverage. Because of Lisa [name]'s past medical history and time since last visit, he/she will require a follow-up Office visit in order to better assess preoperative cardiovascular risk.  Patient has not seen any of [facility] cardiologist.  Patient was previously seen by Dr. Algie Coffer  Pre-op covering staff: - Please schedule appointment and call patient to inform them. - Please contact requesting surgeon's office via preferred method (i.e, phone, fax) to inform them of need for appointment prior to surgery.  If applicable, this message will also be routed to pharmacy pool and/or primary cardiologist for input on holding anticoagulant/antiplatelet agent as requested below so that this information is available at time of patient's appointment.   Ronney Asters, NP  07/22/2022, 3:33 PM

## 2022-07-22 NOTE — Telephone Encounter (Signed)
I s/w the pt and confirmed that Dr. Algie Coffer is her cardiologist. I assured the pt that I will let GI office know that pt has never been seen by [facility]. GI will need to contact Dr. Algie Coffer for cardiac clearance.

## 2022-07-22 NOTE — Patient Instructions (Addendum)
Visit Information  Thank you for taking time to visit with me today. Please don't hesitate to contact me if I can be of assistance to you.   Following are the goals we discussed today:  -Engineer, maintenance at [phone] -Engage with [employer] to enroll with Grocery Delivery Program   Our next appointment is by telephone on 4/23 at 11:00  Please call the care guide team at [phone] if you need to cancel or reschedule your appointment.   If you are experiencing a Mental Health or Behavioral Health Crisis or need someone to talk to, please go to [facility] [address] [place] [phone]) call 911  The patient verbalized understanding of instructions, educational materials, and care plan provided today and DECLINED offer to receive copy of patient instructions, educational materials, and care plan.   [name], BSW, CDP Social Worker, Certified Dementia Practitioner [facility] Care Management  Care Coordination [phone]

## 2022-07-22 NOTE — Telephone Encounter (Signed)
[facility] Pre-operative Risk Assessment     Request for surgical clearance:     Endoscopy Procedure  What type of surgery is being performed?     Egd/ colon  When is this surgery scheduled?     09/29/2022  What type of clearance is required ?   Pharmacy  Are there any medications that need to be held prior to surgery and how long? Plavix- 5 days   Practice name and name of physician performing surgery?      [facility]  What is your office phone and fax number?      Phone- [phone]  Fax- [phone]  Anesthesia type (None, local, MAC, general) ?       MAC

## 2022-07-24 ENCOUNTER — Telehealth: Payer: Self-pay

## 2022-07-24 NOTE — Telephone Encounter (Signed)
    [facility] Pre-operative Risk Assessment     Request for surgical clearance:     Endoscopy Procedure  What type of surgery is being performed?     Colon/egd  When is this surgery scheduled?     09/29/2022  What type of clearance is required ?   Pharmacy  Are there any medications that need to be held prior to surgery and how long? Plavix 5 days   Practice name and name of physician performing surgery?      [facility]  What is your office phone and fax number?      Phone- [phone]  Fax- [phone]  Anesthesia type (None, local, MAC, general) ?       MAC

## 2022-07-25 ENCOUNTER — Telehealth: Payer: Self-pay

## 2022-07-25 DIAGNOSIS — M19041 Primary osteoarthritis, right hand: Secondary | ICD-10-CM

## 2022-07-25 MED ORDER — DICLOFENAC SODIUM 1 % EX GEL: 2.0000 g | Freq: Four times a day (QID) | CUTANEOUS | 3 refills | Status: DC

## 2022-07-25 NOTE — Telephone Encounter (Deleted)
Antiplatelet hold to be addressed by preop team, pt not on anticoagulant.

## 2022-07-25 NOTE — Telephone Encounter (Signed)
Preop Team,   This patient has never been seen by [facility]. It appears she was seen in the hospital by Dr. Orpah Cobb in 2020. She will either need to be set up as a new patient with one of our cardiologists or referred back to Dr. Algie Coffer. Please notify requesting office.   I will remove from pre-op pool.   Thank you!  Etta Grandchild. Jalen Oberry, DNP, NP-C  07/25/2022, 10:30 AM [facility] [address] Office [phone] Fax [phone]

## 2022-07-25 NOTE — Telephone Encounter (Signed)
Received a refill request from pharmacy:  Diclofenac 1% Topical GEL

## 2022-07-28 NOTE — Patient Outreach (Signed)
  Care Coordination   Follow Up Visit Note   07/28/2022 - late note from 07/22/22 encounter Name: Lisa [name] MRN: [id number] DOB: 03-23-1951  Lisa [name] is a 72 y.o. year old female who sees [name], [name], NP for primary care. I spoke with  Frazier Richards Kraszewski by phone today.  What matters to the patients health and wellness today?  Patient needs transportation and food resources    Goals Addressed             This Visit's Progress    Care Coordination Activities       Care Coordination Interventions: Collaboration with RN Care Manager Alto Denver who indicates patient recently moved into her own apartment and would benefit from SW support due to multiple SDoH needs Performed chart review to note patient recently spoke with [facility] Guide Lenard Forth to review both transportation and food resources available to the patient Contacted the patient to assess for ongoing care coordination needs Determined the patient is currently without food until she gets her social security check on 4/10 Outbound call placed to [employer] to determine if food can be delivered to the patient today - determined food is only delivered on the third Tuesday of every month Referral placed to Joanette Gula with [employer] for ongoing grocery delivery Assessed for a support person who could assist the patient with providing food support prior to tomorrow, patient stated she does not have anyone who can assist Provided the patient with the contact number to [employer] advising they may have a volunteer who could assist with food delivery or link patient to appropriate resource Determined the patient has a future primary care provider appointment scheduled on 5/9 that she will need transportation to Advised the patient SW will request care guide contact her to assist with transportation Discussed the patient may switch providers considering she has  moved and her current primary care provider is far from home; patient plans to discuss this during her office visit on 5/9 Collaboration with [employer] Guide colleague Lenard Forth requesting she contact the patient to assist with transportation resources         SDOH assessments and interventions completed:  Yes  SDOH Interventions Today    Flowsheet Row Most Recent Value  SDOH Interventions   Food Insecurity Interventions Other (Comment)  [Referral to [employer]]  Housing Interventions Intervention Not Indicated  [pt with Hx of homelessness- recently moved into own apartment]  Transportation Interventions AMB Referral, Payor Benefit  [pt mentioned will need transportation to upcoming PCP appt. Referral to [facility]]        Care Coordination Interventions:  Yes, provided   Interventions Today    Flowsheet Row Most Recent Value  Chronic Disease   Chronic disease during today's visit Hypertension (HTN), Congestive Heart Failure (CHF), Diabetes  General Interventions   General Interventions Discussed/Reviewed General Interventions Discussed, Programmer, applications, Doctor Visits, Communication with  Doctor Visits Discussed/Reviewed Doctor Visits Reviewed  Communication with RN  Nutrition Interventions   Nutrition Discussed/Reviewed Nutrition Discussed        Follow up plan:  SW will continue to follow    Encounter Outcome:  Pt. Visit Completed   [name], [name], CDP Social Worker, Certified Dementia Practitioner [facility] Care Management  Care Coordination [phone]

## 2022-08-05 ENCOUNTER — Ambulatory Visit: Payer: Self-pay

## 2022-08-05 NOTE — Patient Instructions (Signed)
Visit Information  Thank you for taking time to visit with me today. Please don't hesitate to contact me if I can be of assistance to you.   Following are the goals we discussed today:  -Remain engaged with [employer] to obtain monthly grocery delivery support -Contact SW as needed prior to next scheduled call   Our next appointment is by telephone on 5/28 at 12:30  Please call the care guide team at [phone] if you need to cancel or reschedule your appointment.   If you are experiencing a Mental Health or Behavioral Health Crisis or need someone to talk to, please go to [facility] [address], [place] [phone])  The patient verbalized understanding of instructions, educational materials, and care plan provided today and DECLINED offer to receive copy of patient instructions, educational materials, and care plan.   [name], BSW, CDP Social Worker, Certified Dementia Practitioner [facility] Care Management  Care Coordination [phone]

## 2022-08-05 NOTE — Patient Outreach (Signed)
  Care Coordination   Follow Up Visit Note   08/05/2022 Name: Lisa [name] MRN: [id number] DOB: 14-Sep-1950  Lisa [name] is a 72 y.o. year old female who sees [name], [name], NP for primary care. I spoke with  Lisa [name] by phone today.  What matters to the patients health and wellness today?  Patient is doing well, no acute care coordination concerns at noted during today's call    Goals Addressed             This Visit's Progress    Care Coordination Activities   On track    Care Coordination Interventions: Confirmed patient is engaged with [employer] for grocery delivery support, no longer experiencing food insecurity at this time Assessed for acute resource needs - patient denies acute needs stating "everything is fine" Discussed plans for SW to follow up over the next month to confirm patient is stable in her new apartment        SDOH assessments and interventions completed:  Yes  SDOH Interventions Today    Flowsheet Row Most Recent Value  SDOH Interventions   Food Insecurity Interventions Other (Comment)  [Pt now engaged with [employer] and denies food insecurity at this time]        Care Coordination Interventions:  Yes, provided   Interventions Today    Flowsheet Row Most Recent Value  Chronic Disease   Chronic disease during today's visit Hypertension (HTN), Diabetes, Congestive Heart Failure (CHF)  General Interventions   General Interventions Discussed/Reviewed General Interventions Reviewed  Nutrition Interventions   Nutrition Discussed/Reviewed Nutrition Reviewed        Follow up plan: Follow up call scheduled for 5/28 at 12:30    Encounter Outcome:  Pt. Visit Completed   Lisa [name], Lisa [name], CDP Social Worker, Certified Dementia Practitioner [facility] Care Management  Care Coordination [phone]

## 2022-08-12 DIAGNOSIS — I10 Essential (primary) hypertension: Secondary | ICD-10-CM

## 2022-08-12 DIAGNOSIS — E119 Type 2 diabetes mellitus without complications: Secondary | ICD-10-CM

## 2022-08-20 ENCOUNTER — Telehealth: Payer: Self-pay

## 2022-08-20 NOTE — Telephone Encounter (Signed)
Patient attempted to be outreached by Sharion Dove, PharmD Candidate on 08/20/22 to discuss hypertension. Left voicemail for patient to return our call at their convenience at [phone].  Camia Dipinto, Student-PharmD

## 2022-08-21 ENCOUNTER — Ambulatory Visit: Payer: Medicare PPO | Admitting: Nurse Practitioner

## 2022-08-21 ENCOUNTER — Telehealth: Payer: Self-pay | Admitting: Nurse Practitioner

## 2022-08-21 NOTE — Telephone Encounter (Signed)
Pt was a no show for an OV with [name] on 08/21/22, I sent a no show letter.

## 2022-08-25 ENCOUNTER — Ambulatory Visit: Payer: Medicare PPO

## 2022-09-01 ENCOUNTER — Other Ambulatory Visit: Payer: Self-pay | Admitting: Nurse Practitioner

## 2022-09-01 ENCOUNTER — Telehealth: Payer: Medicare PPO

## 2022-09-01 ENCOUNTER — Ambulatory Visit (INDEPENDENT_AMBULATORY_CARE_PROVIDER_SITE_OTHER): Payer: Medicare PPO

## 2022-09-01 DIAGNOSIS — I10 Essential (primary) hypertension: Secondary | ICD-10-CM

## 2022-09-01 DIAGNOSIS — E119 Type 2 diabetes mellitus without complications: Secondary | ICD-10-CM

## 2022-09-01 DIAGNOSIS — F320 Major depressive disorder, single episode, mild: Secondary | ICD-10-CM

## 2022-09-01 NOTE — Patient Instructions (Signed)
Please call the care guide team at [phone] if you need to cancel or reschedule your appointment.   If you are experiencing a Mental Health or Behavioral Health Crisis or need someone to talk to, please call the Suicide and Crisis Lifeline: 988 call the [place] National Suicide Prevention Lifeline: (873)449-6389 or TTY: 228-849-3447 TTY (930)842-6259) to talk to a trained counselor call 1-800-273-TALK (toll free, 24 hour hotline) go to [facility] [address], [place] [phone])   Following is a copy of the CCM Program Consent:  CCM service includes personalized support from designated clinical staff supervised by the physician, including individualized plan of care and coordination with other care providers 24/7 contact phone numbers for assistance for urgent and routine care needs. Service will only be billed when office clinical staff spend 20 minutes or more in a month to coordinate care. Only one practitioner may furnish and bill the service in a calendar month. The patient may stop CCM services at amy time (effective at the end of the month) by phone call to the office staff. The patient will be responsible for cost sharing (co-pay) or up to 20% of the service fee (after annual deductible is met)  Following is a copy of your full provider care plan:   Goals Addressed             This Visit's Progress    CCM Expected Outcome:  Monitor, Self-Manage and Reduce Symptoms of Diabetes       Current Barriers:  Knowledge Deficits related to the need to check blood sugars on a regular basis, preventive care needs, and importance of following recommendations by the provider for effective management of DM and other chronic conditions Care Coordination needs related to resources the patient needs for senior housing so she can have a safe place to live without fear of being asked to move out in a patient with DM Chronic Disease Management support and  education needs related to effective management of DM Lacks caregiver support.  Corporate treasurer.  Lab Results  Component Value Date   HGBA1C 5.8 04/17/2022     Planned Interventions: Provided education to patient about basic DM disease process. Review and education. The patient has multiple chronic conditions impacting her care. Her DM are stable. Discussed changes to monitor for and when to call the provider.; Reviewed medications with patient and discussed importance of medication adherence. 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 Denies any swelling. States compliance with medications.The patient will follow up with the pcp tomorrow.        Reviewed prescribed diet with patient heart healthy/ADA diet. Education and support given. The patient states she is staying away for spicy foods. Is taking protonix and also has medications for nausea if she needs them. She says she has only had to take a couple of times. EGD is scheduled for  09-29-2022; Counseled on importance of regular laboratory monitoring as prescribed. Has regular lab testing;        Discussed plans with patient for ongoing care management follow up and provided patient with direct contact information for care management team;      Provided patient with written educational materials related to hypo and hyperglycemia and importance of correct treatment. Denies any drops in her blood sugars or highs. Will continue to monitor;       Reviewed scheduled/upcoming provider appointments including: 09-02-2022  at 1040am;         Ongoing support and education from the SW. The patient is in her own apartment but she would like to find another apartment. She states she had a critter recently and this upset her. She states that she is paying 1000.00 a month and she should not have to deal with any critters. Will send a message to the SW and let her know she  is interested in other places she could rent.    Review of patient status, including review of consultants reports, relevant laboratory and other test results, and medications completed;       Advised patient to discuss changes in her DM, Questions, and concerns with provider;      Screening for signs and symptoms of depression related to chronic disease state;        Assessed social determinant of health barriers;      The patient has had 2 falls recently due to dizziness. She states that once she fell out of the bathtub and once she fell when she was in the kitchen getting some water. She states she has a life alert system now and she is thankful for that. She does want to find another place to live as she says she is having a problem with "critters". Will alert the SW and let her know of the patients concerns. Education and support given.   Symptom Management: Take medications as prescribed   Attend all scheduled provider appointments Call provider office for new concerns or questions  call the Suicide and Crisis Lifeline: 988 call the [place] National Suicide Prevention Lifeline: 743 051 9213 or TTY: 202-356-1811 TTY 440-315-0311) to talk to a trained counselor call 1-800-273-TALK (toll free, 24 hour hotline) if experiencing a Mental Health or Behavioral Health Crisis  schedule appointment with eye doctor check feet daily for cuts, sores or redness trim toenails straight across wash and dry feet carefully every day wear comfortable, cotton socks wear comfortable, well-fitting shoes  Follow Up Plan: Telephone follow up appointment with care management team member scheduled for: 10-09-2022 at 0900 am       CCM Expected Outcome:  Monitor, Self-Manage, and Reduce Symptoms of Hypertension       Current Barriers:  Knowledge Deficits related to the benefits of taking blood pressures on a regular basis to monitor changes in blood pressures and decrease risk of heart attack and stroke Care  Coordination needs related to educational needs for a patient with HTN and other chronic conditions and resources in the community to help patient with housing needs  in a patient with HTN Chronic Disease Management support and education needs related to effective management of HTN Lacks caregiver support.  Needs stable housing, currently lives with her cousin but needs to find a place of her own. Asking for help with resources- 07-18-2022- Is in her own apartment now  BP Readings from Last 3 Encounters:  07/17/22 (!) 140/80  06/19/22 138/60  05/22/22 (!) 162/74     Planned Interventions: Evaluation of current treatment plan related to hypertension self management and patient's adherence to plan as established by provider.The patient is doing better with her blood pressures. The patient has been a little stressed out recently due to having "Critters" in her apartment. They came and supposedly took care of the problem;  however she heard something last night and she could not go back to sleep. Will let the SW know she is interested in help with finding a new apartment.  Provided education to patient re: stroke prevention, s/s of heart attack and stroke. The patient has a history of stroke and the patient mindful of the impact of uncontrolled blood pressures and risk of HA and stroke; Reviewed prescribed diet heart healthy/ADA diet- the patient states she is mindful of her dietary restrictions and she monitors her sodium and sugar intake. She also is monitoring for spicy foods now and knows she needs to watch her intake closely due to the recent hospitalization with finding ulcers present. The patient states today that she is doing "real good" because she "loves herself". The patient has a friend that has been helping with food right  now. She states she is good to her.  Reviewed medications with patient and discussed importance of compliance. The patient states that she does have her medications and she  is taking her medications as directed. Has her medications and is compliant with medications. The patient is back on her hydralazine 25 mg BID, Entresto 49-51 mg BID, Amlodipine 5 mg QD and Carvedilol 6.25 mg BID. She is also still taking her Protonix 40 mg QD now.   Discussed plans with patient for ongoing care management follow up and provided patient with direct contact information for care management team; Advised patient, providing education and rationale, to monitor blood pressure daily and record, calling PCP for findings outside established parameters;  Reviewed scheduled/upcoming provider appointments including: pcp on 09-02-2022 at 1040 am. Reminder given today.  Advised patient to discuss changes in her blood pressures or questions/Concerns about her heart health with provider; Provided education on prescribed diet heart healthy/ADA diet. Review and education. Also discussed avoiding spicy foods that could aggravate the presence of ulcers ;  Discussed complications of poorly controlled blood pressure such as heart disease, stroke, circulatory complications, vision complications, kidney impairment, sexual dysfunction;  Screening for signs and symptoms of depression related to chronic disease state;  Assessed social determinant of health barriers;   Symptom Management: Take medications as prescribed   Attend all scheduled provider appointments Call provider office for new concerns or questions  call the Suicide and Crisis Lifeline: 988 call the [place] National Suicide Prevention Lifeline: 910 118 1469 or TTY: (517)187-4074 TTY (205)772-6311) to talk to a trained counselor call 1-800-273-TALK (toll free, 24 hour hotline) if experiencing a Mental Health or Behavioral Health Crisis  check blood pressure weekly learn about high blood pressure call doctor for signs and symptoms of high blood pressure develop an action plan for high blood pressure keep all doctor appointments take medications  for blood pressure exactly as prescribed report new symptoms to your doctor  Follow Up Plan: Telephone follow up appointment with care management team member scheduled for: 10-09-2022 at 0900 am          The patient verbalized understanding of instructions, educational materials, and care plan provided today and DECLINED offer to receive copy of patient instructions, educational materials, and care plan.  Telephone follow up appointment with care management team member scheduled for: 10-09-2022 at 0900 am

## 2022-09-01 NOTE — Chronic Care Management (AMB) (Signed)
Chronic Care Management   CCM RN Visit Note  09/01/2022 Name: Lisa [name] MRN: [id number] DOB: [birth year]  Subjective: Lisa [name] is a 72 y.o. year old female who is a primary care patient of [name], [name], NP. The patient was referred to the Chronic Care Management team for assistance with care management needs subsequent to provider initiation of CCM services and plan of care.    Today's Visit:  Engaged with patient by telephone for follow up visit.        Goals Addressed             This Visit's Progress    CCM Expected Outcome:  Monitor, Self-Manage and Reduce Symptoms of Diabetes       Current Barriers:  Knowledge Deficits related to the need to check blood sugars on a regular basis, preventive care needs, and importance of following recommendations by the provider for effective management of DM and other chronic conditions Care Coordination needs related to resources the patient needs for senior housing so she can have a safe place to live without fear of being asked to move out in a patient with DM Chronic Disease Management support and education needs related to effective management of DM Lacks caregiver support.  Corporate treasurer.  Lab Results  Component Value Date   HGBA1C 5.8 04/17/2022     Planned Interventions: Provided education to patient about basic DM disease process. Review and education. The patient has multiple chronic conditions impacting her care. Her DM are stable. Discussed changes to monitor for and when to call the provider.; Reviewed medications with patient and discussed importance of medication adherence. 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 Denies any swelling. States compliance with medications.The patient will follow up with the pcp tomorrow.        Reviewed prescribed diet with patient heart healthy/ADA diet. Education and support given.  The patient states she is staying away for spicy foods. Is taking protonix and also has medications for nausea if she needs them. She says she has only had to take a couple of times. EGD is scheduled for  09-29-2022; Counseled on importance of regular laboratory monitoring as prescribed. Has regular lab testing;        Discussed plans with patient for ongoing care management follow up and provided patient with direct contact information for care management team;      Provided patient with written educational materials related to hypo and hyperglycemia and importance of correct treatment. Denies any drops in her blood sugars or highs. Will continue to monitor;       Reviewed scheduled/upcoming provider appointments including: 09-02-2022 at 1040am;         Ongoing support and education from the SW. The patient is in her own apartment but she would like to find another apartment. She states she had a critter recently and this upset her. She states that she is paying 1000.00 a month and she should not have to deal with any critters. Will send a message to the SW and let her know she is interested in other places she could rent.    Review of patient status, including review of consultants reports, relevant laboratory and other test results, and medications completed;       Advised patient to discuss changes in her DM, Questions, and concerns with provider;      Screening for signs and  symptoms of depression related to chronic disease state;        Assessed social determinant of health barriers;      The patient has had 2 falls recently due to dizziness. She states that once she fell out of the bathtub and once she fell when she was in the kitchen getting some water. She states she has a life alert system now and she is thankful for that. She does want to find another place to live as she says she is having a problem with "critters". Will alert the SW and let her know of the patients concerns. Education and  support given.   Symptom Management: Take medications as prescribed   Attend all scheduled provider appointments Call provider office for new concerns or questions  call the Suicide and Crisis Lifeline: 988 call the [place] National Suicide Prevention Lifeline: (847) 178-3982 or TTY: 3395124171 TTY 6716950609) to talk to a trained counselor call 1-800-273-TALK (toll free, 24 hour hotline) if experiencing a Mental Health or Behavioral Health Crisis  schedule appointment with eye doctor check feet daily for cuts, sores or redness trim toenails straight across wash and dry feet carefully every day wear comfortable, cotton socks wear comfortable, well-fitting shoes  Follow Up Plan: Telephone follow up appointment with care management team member scheduled for: 10-09-2022 at 0900 am       CCM Expected Outcome:  Monitor, Self-Manage, and Reduce Symptoms of Hypertension       Current Barriers:  Knowledge Deficits related to the benefits of taking blood pressures on a regular basis to monitor changes in blood pressures and decrease risk of heart attack and stroke Care Coordination needs related to educational needs for a patient with HTN and other chronic conditions and resources in the community to help patient with housing needs  in a patient with HTN Chronic Disease Management support and education needs related to effective management of HTN Lacks caregiver support.  Needs stable housing, currently lives with her cousin but needs to find a place of her own. Asking for help with resources- 07-18-2022- Is in her own apartment now  BP Readings from Last 3 Encounters:  07/17/22 (!) 140/80  06/19/22 138/60  05/22/22 (!) 162/74     Planned Interventions: Evaluation of current treatment plan related to hypertension self management and patient's adherence to plan as established by provider.The patient is doing better with her blood pressures. The patient has been a little stressed out recently  due to having "Critters" in her apartment. They came and supposedly took care of the problem;  however she heard something last night and she could not go back to sleep. Will let the SW know she is interested in help with finding a new apartment.  Provided education to patient re: stroke prevention, s/s of heart attack and stroke. The patient has a history of stroke and the patient mindful of the impact of uncontrolled blood pressures and risk of HA and stroke; Reviewed prescribed diet heart healthy/ADA diet- the patient states she is mindful of her dietary restrictions and she monitors her sodium and sugar intake. She also is monitoring for spicy foods now and knows she needs to watch her intake closely due to the recent hospitalization with finding ulcers present. The patient states today that she is doing "real good" because she "loves herself". The patient has a friend that has been helping with food right  now. She states she is good to her.  Reviewed medications with patient and discussed importance of  compliance. The patient states that she does have her medications and she is taking her medications as directed. Has her medications and is compliant with medications. The patient is back on her hydralazine 25 mg BID, Entresto 49-51 mg BID, Amlodipine 5 mg QD and Carvedilol 6.25 mg BID. She is also still taking her Protonix 40 mg QD now.   Discussed plans with patient for ongoing care management follow up and provided patient with direct contact information for care management team; Advised patient, providing education and rationale, to monitor blood pressure daily and record, calling PCP for findings outside established parameters;  Reviewed scheduled/upcoming provider appointments including: pcp on 09-02-2022 at 1040 am. Reminder given today.  Advised patient to discuss changes in her blood pressures or questions/Concerns about her heart health with provider; Provided education on prescribed diet heart  healthy/ADA diet. Review and education. Also discussed avoiding spicy foods that could aggravate the presence of ulcers ;  Discussed complications of poorly controlled blood pressure such as heart disease, stroke, circulatory complications, vision complications, kidney impairment, sexual dysfunction;  Screening for signs and symptoms of depression related to chronic disease state;  Assessed social determinant of health barriers;   Symptom Management: Take medications as prescribed   Attend all scheduled provider appointments Call provider office for new concerns or questions  call the Suicide and Crisis Lifeline: 988 call the [place] National Suicide Prevention Lifeline: (913)672-5558 or TTY: 9514458241 TTY 718-269-6272) to talk to a trained counselor call 1-800-273-TALK (toll free, 24 hour hotline) if experiencing a Mental Health or Behavioral Health Crisis  check blood pressure weekly learn about high blood pressure call doctor for signs and symptoms of high blood pressure develop an action plan for high blood pressure keep all doctor appointments take medications for blood pressure exactly as prescribed report new symptoms to your doctor  Follow Up Plan: Telephone follow up appointment with care management team member scheduled for: 10-09-2022 at 0900 am          Plan:Telephone follow up appointment with care management team member scheduled for:  10-09-2022 at 9 am  Alto Denver RN, MSN, CCM RN Care Manager  Chronic Care Management Direct Number: [phone]

## 2022-09-02 ENCOUNTER — Ambulatory Visit (INDEPENDENT_AMBULATORY_CARE_PROVIDER_SITE_OTHER): Payer: Medicare PPO | Admitting: Nurse Practitioner

## 2022-09-02 ENCOUNTER — Encounter: Payer: Self-pay | Admitting: Nurse Practitioner

## 2022-09-02 VITALS — BP 130/58 | HR 71 | Temp 97.9°F | Ht 65.0 in | Wt 129.0 lb

## 2022-09-02 DIAGNOSIS — R42 Dizziness and giddiness: Secondary | ICD-10-CM

## 2022-09-02 DIAGNOSIS — R200 Anesthesia of skin: Secondary | ICD-10-CM | POA: Diagnosis not present

## 2022-09-02 DIAGNOSIS — R202 Paresthesia of skin: Secondary | ICD-10-CM | POA: Diagnosis not present

## 2022-09-02 MED ORDER — VITAMIN D 25 MCG (1000 UNIT) PO TABS: 2000.0000 [IU] | ORAL_TABLET | Freq: Every day | ORAL | 3 refills | Status: DC

## 2022-09-02 MED ORDER — ONE-DAILY MULTI VITAMINS PO TABS: 1.0000 | ORAL_TABLET | Freq: Every day | ORAL | 3 refills | Status: DC

## 2022-09-02 MED ORDER — PREGABALIN 75 MG PO CAPS: 75.0000 mg | ORAL_CAPSULE | Freq: Every day | ORAL | 0 refills | Status: DC

## 2022-09-02 MED ORDER — TRIAMTERENE-HCTZ 37.5-25 MG PO TABS: 1.0000 | ORAL_TABLET | Freq: Every day | ORAL | 0 refills | Status: DC

## 2022-09-02 NOTE — Patient Instructions (Signed)
It was great to see you!  Your last A1c was 5.8%. If you would like, you can stop your metformin and keep checking your sugars.   Stop your amlodipine. Keep checking your blood pressure at home. Call me if it goes above 150/90 for several days or if your dizzy spells get worse.   Start triamterine/hctz 1 tablet daily in the morning to help with your dizzy spells. I have placed a referral to ENT as I am concerned for Meniere's disease.  You can call them to schedule an appointment at: [phone]  Stop the gabapentin and start lyrica 1 capsule daily for the pain in your hands and feet.   Let's follow-up in 3 weeks, sooner if you have concerns.  If a referral was placed today, you will be contacted for an appointment. Please note that routine referrals can sometimes take up to 3-4 weeks to process. Please call our office if you haven't heard anything after this time frame.  Take care,  Rodman Pickle, NP

## 2022-09-02 NOTE — Assessment & Plan Note (Addendum)
Chronic, not controlled. 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 Will stop her gabapentin since this is not helping and switch her to lyrica 75mg  daily. PDMP reviewed. Follow-up in 3 weeks.

## 2022-09-02 NOTE — Progress Notes (Signed)
Established Patient Office Visit  Subjective   Patient ID: Lisa [name], female    DOB: [birth year]  Age: 72 y.o. MRN: [id number]  Chief Complaint  Patient presents with   Numbness    In hands and feet, dizziness    HPI  [facility] is here to follow-up on dizziness and numbness and tingling in her hands and feet.   She states that she is still having ongoing dizzy spells that will come suddenly and last a few minutes. They are associated with decreased/muffled hearing. This has caused her to fall twice. She was referred to ENT last visit, but has not scheduled an appointment with them yet. She denies any new confusion, weakness on one side of her body, and facial droop. She can't drive and has trouble taking the bus and brought paper work for the [employer] transportation to be completed.   She also notes that she is still having ongoing neuropathy in her hands and feet. She states the gabapentin is not helping with her symptoms. It has not gotten worse, but it is not any better. She has been checking her blood sugars at home and they have been ranging from 110s-130s.     ROS See pertinent positives and negatives per HPI.    Objective:     BP (!) 130/58 (BP Location: Left Arm)   Pulse 71   Temp 97.9 F (36.6 C)   Ht 5\' 5"  (1.651 m)   Wt 129 lb (58.5 kg)   SpO2 97%   BMI 21.47 kg/m    Physical Exam Vitals and nursing note reviewed.  Constitutional:      General: She is not in acute distress.    Appearance: Normal appearance.  HENT:     Head: Normocephalic.  Eyes:     Conjunctiva/sclera: Conjunctivae normal.  Cardiovascular:     Rate and Rhythm: Normal rate and regular rhythm.     Pulses: Normal pulses.     Heart sounds: Normal heart sounds.  Pulmonary:     Effort: Pulmonary effort is normal.     Breath sounds: Normal breath sounds.  Musculoskeletal:     Cervical back: Normal range of motion.  Skin:    General: Skin is warm.  Neurological:     General:  No focal deficit present.     Mental Status: She is alert and oriented to person, place, and time.  Psychiatric:        Mood and Affect: Mood normal.        Behavior: Behavior normal.        Thought Content: Thought content normal.        Judgment: Judgment normal.     The ASCVD Risk score (Arnett DK, et al., 2019) failed to calculate for the following reasons:   The patient has a prior MI or stroke diagnosis    Assessment & Plan:   Problem List Items Addressed This Visit       Other   Numbness and tingling in both hands - Primary    Chronic, not controlled. 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 Will stop her gabapentin since this is not helping and switch her to lyrica 75mg  daily. PDMP reviewed. Follow-up in 3 weeks.       Dizziness    Chronic, not controlled. With the dizzy spells that start suddenly, last for a few minutes and are associated with hearing loss, concern for meniere's disease. Referred her to ENT last visit,  gave her the phone number to call and schedule and appointment. Will have her stop her amlodipine and start triamterene/hctz 37.5/25mg  daily. Keep checking her blood pressure at home and write it down. Follow-up in 3 weeks. Will check labs next visit.        Return in about 3 weeks (around 09/23/2022) for dizziness, blood pressure.    Gerre Scull, NP

## 2022-09-03 ENCOUNTER — Encounter: Payer: Self-pay | Admitting: Nurse Practitioner

## 2022-09-03 NOTE — Assessment & Plan Note (Signed)
Chronic, not controlled. With the dizzy spells that start suddenly, last for a few minutes and are associated with hearing loss, concern for meniere's disease. Referred her to ENT last visit, gave her the phone number to call and schedule and appointment. Will have her stop her amlodipine and start triamterene/hctz 37.5/25mg  daily. Keep checking her blood pressure at home and write it down. Follow-up in 3 weeks. Will check labs next visit.

## 2022-09-05 ENCOUNTER — Encounter: Payer: Self-pay | Admitting: Nurse Practitioner

## 2022-09-05 NOTE — Telephone Encounter (Signed)
2nd no show, fee generated, final warning sent via mail, pt was seen 5/21

## 2022-09-09 ENCOUNTER — Ambulatory Visit: Payer: Self-pay

## 2022-09-09 NOTE — Telephone Encounter (Signed)
Noted  

## 2022-09-09 NOTE — Patient Instructions (Signed)
Visit Information  Thank you for taking time to visit with me today. Please don't hesitate to contact me if I can be of assistance to you.   Following are the goals we discussed today:  -Work with SW to explore resources to assist with obtaining a bed   If you are experiencing a Mental Health or Behavioral Health Crisis or need someone to talk to, please call 1-800-273-TALK (toll free, 24 hour hotline) call 911  The patient verbalized understanding of instructions, educational materials, and care plan provided today and DECLINED offer to receive copy of patient instructions, educational materials, and care plan.   [name], BSW, CDP Social Worker, Certified Dementia Practitioner [facility] Care Management  Care Coordination [phone]

## 2022-09-09 NOTE — Patient Outreach (Signed)
  Care Coordination   Follow Up Visit Note   09/09/2022 Name: Lisa [name] MRN: [id number] DOB: [birth year]  Lisa [name] is [name] 72 y.o. year old female who sees [name], [name], NP for primary care. I spoke with  Lisa [name] by phone today.  What matters to Lisa patients health and wellness today?  Lisa patient would like to obtain [name] bed for her apartment    Goals Addressed             This Visit's Progress    Care Coordination Activities       Care Coordination Interventions: Collaboration with RN Care Manager who indicates patient has expressed concerns with pests in her current apartment and not feeling safe. Patient reported to RN Care Manager she would like to move Contacted Lisa patient who denies interest in moving. Patient does not express any concerns with feeling unsafe or pests in her home Determined Lisa patient has been sleeping on [name] couch and would like assistance with resources to get her [name] bed. Lisa patient does not have enough funds to purchase her own bed for her apartment Discussed plans for SW to look into resource options to assist with Lisa cost of [name] bed Contacted Lisa [name]; voice message left requesting [name] return call Confirmed Lisa patient remains engaged with [facility] and is not currently experiencing food insecurity Advised Lisa patient SW will contact her over Lisa next 14 days after exploring resources         SDOH assessments and interventions completed:  No     Care Coordination Interventions:  Yes, provided   Interventions Today    Flowsheet Row Most Recent Value  Chronic Disease   Chronic disease during today's visit Congestive Heart Failure (CHF), Hypertension (HTN)  General Interventions   General Interventions Discussed/Reviewed General Interventions Reviewed, Community Resources        Follow up plan:  SW will continue to follow.    Encounter Outcome:  Pt. Visit Completed   Lisa [name],  BSW, CDP Social Worker, Certified Dementia Practitioner Lisa [name] Care Management  Care Coordination [phone]

## 2022-09-10 NOTE — Patient Outreach (Signed)
  Care Coordination   Follow Up Visit Note   09/10/2022 Name: Lisa [name] MRN: [id number] DOB: 11-Oct-1950  Lisa [name] is a 72 y.o. year old female who sees [name], [name], NP for primary care. I spoke with  Frazier Richards Rondinelli by phone today.  What matters to the patients health and wellness today?  Patient would like to obtain a bed for her home.    Goals Addressed             This Visit's Progress    Care Coordination Activities   On track    Care Coordination Interventions: Collaboration with Janet Berlin, Program Services Coordinator with [employer] who advises SW they do not typically share community partners as they do not want individuals seeking services from community partners for the sole purpose of obtaining furniture Advised Ms. Montez Morita the patient is currently linked to [facility] for meal support - Ms. Montez Morita confirms this agency is on the community partner list Discussed the patient would need to contact [facility] to request assistance with obtaining furniture Education provided to the patient advising she needs to contact [facility] to request assistance with obtaining furniture in her apartment; provided the patient with contact number to [facility]        SDOH assessments and interventions completed:  No     Care Coordination Interventions:  Yes, provided   Interventions Today    Flowsheet Row Most Recent Value  Chronic Disease   Chronic disease during today's visit Congestive Heart Failure (CHF), Hypertension (HTN)  General Interventions   General Interventions Discussed/Reviewed [employer], Communication with  [[employer]]        Follow up plan: Follow up call scheduled for 6/20    Encounter Outcome:  Pt. Visit Completed   [name], [name], CDP Social Worker, Certified Dementia Practitioner [facility] Care Management  Care Coordination [phone]

## 2022-09-10 NOTE — Patient Instructions (Signed)
Visit Information  Thank you for taking time to visit with me today. Please don't hesitate to contact me if I can be of assistance to you.   Following are the goals we discussed today:  -Call [facility] at [phone]   Our next appointment is by telephone on 6/20 at 12:30  Please call the care guide team at [phone] if you need to cancel or reschedule your appointment.   If you are experiencing a Mental Health or Behavioral Health Crisis or need someone to talk to, please go to [facility] [address] [place] [phone]) call 911  The patient verbalized understanding of instructions, educational materials, and care plan provided today and DECLINED offer to receive copy of patient instructions, educational materials, and care plan.   [name], BSW, CDP Social Worker, Certified Dementia Practitioner [facility] Care Management  Care Coordination [phone]

## 2022-09-12 DIAGNOSIS — I10 Essential (primary) hypertension: Secondary | ICD-10-CM

## 2022-09-12 DIAGNOSIS — E119 Type 2 diabetes mellitus without complications: Secondary | ICD-10-CM

## 2022-09-23 ENCOUNTER — Emergency Department (HOSPITAL_COMMUNITY): Payer: Medicare HMO

## 2022-09-23 ENCOUNTER — Telehealth: Payer: Self-pay | Admitting: Nurse Practitioner

## 2022-09-23 ENCOUNTER — Encounter (HOSPITAL_COMMUNITY): Payer: Self-pay

## 2022-09-23 ENCOUNTER — Observation Stay (HOSPITAL_COMMUNITY)
Admission: EM | Admit: 2022-09-23 | Discharge: 2022-09-25 | Disposition: A | Discharge status: Home Health | Payer: Medicare HMO | Attending: Internal Medicine | Admitting: Internal Medicine

## 2022-09-23 DIAGNOSIS — I5032 Chronic diastolic (congestive) heart failure: Secondary | ICD-10-CM | POA: Diagnosis present

## 2022-09-23 DIAGNOSIS — I5042 Chronic combined systolic (congestive) and diastolic (congestive) heart failure: Secondary | ICD-10-CM | POA: Insufficient documentation

## 2022-09-23 DIAGNOSIS — I2510 Atherosclerotic heart disease of native coronary artery without angina pectoris: Secondary | ICD-10-CM | POA: Diagnosis not present

## 2022-09-23 DIAGNOSIS — K5730 Diverticulosis of large intestine without perforation or abscess without bleeding: Secondary | ICD-10-CM | POA: Diagnosis not present

## 2022-09-23 DIAGNOSIS — E1140 Type 2 diabetes mellitus with diabetic neuropathy, unspecified: Secondary | ICD-10-CM | POA: Insufficient documentation

## 2022-09-23 DIAGNOSIS — I6381 Other cerebral infarction due to occlusion or stenosis of small artery: Secondary | ICD-10-CM | POA: Diagnosis not present

## 2022-09-23 DIAGNOSIS — Z87891 Personal history of nicotine dependence: Secondary | ICD-10-CM | POA: Insufficient documentation

## 2022-09-23 DIAGNOSIS — I959 Hypotension, unspecified: Secondary | ICD-10-CM | POA: Diagnosis not present

## 2022-09-23 DIAGNOSIS — M4802 Spinal stenosis, cervical region: Secondary | ICD-10-CM | POA: Diagnosis not present

## 2022-09-23 DIAGNOSIS — I110 Hypertensive heart disease with heart failure: Secondary | ICD-10-CM | POA: Diagnosis not present

## 2022-09-23 DIAGNOSIS — R202 Paresthesia of skin: Secondary | ICD-10-CM | POA: Diagnosis present

## 2022-09-23 DIAGNOSIS — Z8673 Personal history of transient ischemic attack (TIA), and cerebral infarction without residual deficits: Secondary | ICD-10-CM | POA: Diagnosis not present

## 2022-09-23 DIAGNOSIS — G9389 Other specified disorders of brain: Secondary | ICD-10-CM | POA: Diagnosis not present

## 2022-09-23 DIAGNOSIS — M47812 Spondylosis without myelopathy or radiculopathy, cervical region: Secondary | ICD-10-CM | POA: Diagnosis present

## 2022-09-23 DIAGNOSIS — R296 Repeated falls: Secondary | ICD-10-CM | POA: Diagnosis not present

## 2022-09-23 DIAGNOSIS — R42 Dizziness and giddiness: Secondary | ICD-10-CM | POA: Diagnosis not present

## 2022-09-23 DIAGNOSIS — M542 Cervicalgia: Secondary | ICD-10-CM | POA: Diagnosis not present

## 2022-09-23 DIAGNOSIS — K5900 Constipation, unspecified: Secondary | ICD-10-CM | POA: Diagnosis not present

## 2022-09-23 DIAGNOSIS — W19XXXA Unspecified fall, initial encounter: Secondary | ICD-10-CM | POA: Diagnosis not present

## 2022-09-23 DIAGNOSIS — N179 Acute kidney failure, unspecified: Secondary | ICD-10-CM | POA: Diagnosis not present

## 2022-09-23 DIAGNOSIS — S0990XA Unspecified injury of head, initial encounter: Secondary | ICD-10-CM | POA: Diagnosis not present

## 2022-09-23 DIAGNOSIS — I10 Essential (primary) hypertension: Secondary | ICD-10-CM | POA: Diagnosis not present

## 2022-09-23 HISTORY — DX: Atherosclerotic heart disease of native coronary artery without angina pectoris: I25.10

## 2022-09-23 LAB — CBC WITH DIFFERENTIAL/PLATELET
Abs Immature Granulocytes: 0.01 10*3/uL (ref 0.00–0.07)
Basophils Absolute: 0 10*3/uL (ref 0.0–0.1)
Basophils Relative: 1 %
Eosinophils Absolute: 0.1 10*3/uL (ref 0.0–0.5)
Eosinophils Relative: 2 %
HCT: 35.1 % — ABNORMAL LOW (ref 36.0–46.0)
Hemoglobin: 11.3 g/dL — ABNORMAL LOW (ref 12.0–15.0)
Immature Granulocytes: 0 %
Lymphocytes Relative: 29 %
Lymphs Abs: 1.2 10*3/uL (ref 0.7–4.0)
MCH: 28.4 pg (ref 26.0–34.0)
MCHC: 32.2 g/dL (ref 30.0–36.0)
MCV: 88.2 fL (ref 80.0–100.0)
Monocytes Absolute: 0.4 10*3/uL (ref 0.1–1.0)
Monocytes Relative: 10 %
Neutro Abs: 2.4 10*3/uL (ref 1.7–7.7)
Neutrophils Relative %: 58 %
Platelets: 208 10*3/uL (ref 150–400)
RBC: 3.98 MIL/uL (ref 3.87–5.11)
RDW: 15.9 % — ABNORMAL HIGH (ref 11.5–15.5)
WBC: 4.1 10*3/uL (ref 4.0–10.5)
nRBC: 0 % (ref 0.0–0.2)

## 2022-09-23 LAB — COMPREHENSIVE METABOLIC PANEL
ALT: 13 U/L (ref 0–44)
AST: 18 U/L (ref 15–41)
Albumin: 4.1 g/dL (ref 3.5–5.0)
Alkaline Phosphatase: 66 U/L (ref 38–126)
Anion gap: 9 (ref 5–15)
BUN: 27 mg/dL — ABNORMAL HIGH (ref 8–23)
CO2: 22 mmol/L (ref 22–32)
Calcium: 8.9 mg/dL (ref 8.9–10.3)
Chloride: 104 mmol/L (ref 98–111)
Creatinine, Ser: 1.72 mg/dL — ABNORMAL HIGH (ref 0.44–1.00)
GFR, Estimated: 31 mL/min — ABNORMAL LOW (ref >60)
Glucose, Bld: 136 mg/dL — ABNORMAL HIGH (ref 70–99)
Potassium: 4.1 mmol/L (ref 3.5–5.1)
Sodium: 135 mmol/L (ref 135–145)
Total Bilirubin: 0.6 mg/dL (ref 0.3–1.2)
Total Protein: 7.5 g/dL (ref 6.5–8.1)

## 2022-09-23 LAB — TROPONIN I (HIGH SENSITIVITY)
Troponin I (High Sensitivity): 8 ng/L (ref <18)
Troponin I (High Sensitivity): 8 ng/L (ref <18)

## 2022-09-23 MED ORDER — MECLIZINE HCL 25 MG PO TABS
25.0000 mg | ORAL_TABLET | Freq: Once | ORAL | Status: AC
  Administered 2022-09-24: 25 mg via ORAL
  Filled 2022-09-23: qty 1

## 2022-09-23 MED ORDER — MECLIZINE HCL 25 MG PO TABS: 50.0000 mg | ORAL_TABLET | Freq: Once | ORAL | Status: DC

## 2022-09-23 MED ORDER — LACTATED RINGERS IV BOLUS
1500.0000 mL | Freq: Once | INTRAVENOUS | Status: AC
  Administered 2022-09-24: 1500 mL via INTRAVENOUS

## 2022-09-23 NOTE — ED Provider Notes (Signed)
[place] EMERGENCY DEPARTMENT AT [facility] Provider Note   CSN: [id number] Arrival date & time: 09/23/22  1849     History Chief Complaint  Patient presents with   Dizziness    HPI Lisa [name] is a 72 y.o. female presenting for chief complaint of dizziness and lightheadedness.  She is a 72 year old female with an extensive medical history history.  Poor p.o. intake secondary to nausea. States that she has had multiple symptoms over the past week and had 2 episodes of syncope today.  There is a history of gastric ulcer that caused similar presentation in the past history of heart failure, history of diabetes.  Patient's recorded medical, surgical, social, medication list and allergies were reviewed in the Snapshot window as part of the initial history.   Review of Systems   Review of Systems  Constitutional:  Negative for chills and fever.  HENT:  Negative for ear pain and sore throat.   Eyes:  Negative for pain and visual disturbance.  Respiratory:  Positive for shortness of breath. Negative for cough.   Cardiovascular:  Negative for chest pain and palpitations.  Gastrointestinal:  Negative for abdominal pain and vomiting.  Genitourinary:  Negative for dysuria and hematuria.  Musculoskeletal:  Negative for arthralgias and back pain.  Skin:  Negative for color change and rash.  Neurological:  Negative for seizures and syncope.  All other systems reviewed and are negative.   Physical Exam Updated Vital Signs BP (!) 171/69 (BP Location: Right Arm)   Pulse (!) 59   Temp 97.7 F (36.5 C) (Oral)   Resp 18   Ht 5\' 5"  (1.651 m)   Wt 58 kg   SpO2 100%   BMI 21.28 kg/m  Physical Exam Vitals and nursing note reviewed.  Constitutional:      General: She is not in acute distress.    Appearance: She is well-developed.  HENT:     Head: Normocephalic and atraumatic.  Eyes:     Conjunctiva/sclera: Conjunctivae normal.  Cardiovascular:     Rate and Rhythm:  Normal rate and regular rhythm.     Heart sounds: No murmur heard. Pulmonary:     Effort: Pulmonary effort is normal. No respiratory distress.     Breath sounds: Normal breath sounds.  Abdominal:     General: There is no distension.     Palpations: Abdomen is soft.     Tenderness: There is no abdominal tenderness. There is no right CVA tenderness or left CVA tenderness.  Musculoskeletal:        General: No swelling or tenderness. Normal range of motion.     Cervical back: Neck supple.  Skin:    General: Skin is warm and dry.  Neurological:     General: No focal deficit present.     Mental Status: She is alert and oriented to person, place, and time. Mental status is at baseline.     Cranial Nerves: No cranial nerve deficit.      ED Course/ Medical Decision Making/ A&P    Procedures Procedures   Medications Ordered in ED Medications  lactated ringers bolus 1,500 mL (1,500 mLs Intravenous New Bag/Given 09/24/22 0013)  meclizine (ANTIVERT) tablet 25 mg (25 mg Oral Given 09/24/22 0010)    Medical Decision Making:    Lisa [name] is a 72 y.o. female who presented to the ED today with chief complaint of lightheadedness/dizziness detailed above.     Patient's presentation is complicated by their history of  multiple comorbid medical problems.  Patient placed on continuous vitals and telemetry monitoring while in ED which was reviewed periodically.   Complete initial physical exam performed, notably the patient  was hemodynamically stable in no acute distress.      Reviewed and confirmed nursing documentation for past medical history, family history, social history.    Initial Assessment:   Patient's history of present illness and physical exam findings raise concern for nonspecific vertigo likely benign paroxysmal positional.  Consider metabolic disruption, intracranial hemorrhage, intracranial mass, CVA.  Also considered dehydration given her poor p.o. intake in the past  couple days in the context of a history of peptic ulcer disease.  No acute abdominal pain at this time Initial Plan:  CT Head to eval for structural intracranial pathology CT renal study to evaluate for obstructive urologic pathology Screening labs including CBC and Metabolic panel to evaluate for infectious or metabolic etiology of disease.  Urinalysis with reflex culture ordered to evaluate for UTI or relevant urologic/nephrologic pathology.  CXR to evaluate for structural/infectious intrathoracic pathology.  EKG to evaluate for cardiac pathology. Objective evaluation as below reviewed with plan for close reassessment  Initial Study Results:   Laboratory  I was informed that the patient has a AKI with a creatinine elevation as well as BUN elevation.  GFR is dropped from greater than 60-31.  Will add on IV fluids  EKG EKG was reviewed independently. Rate, rhythm, axis, intervals all examined and without medically relevant abnormality. ST segments without concerns for elevations.    Radiology  All images reviewed independently. Agree with radiology report at this time.   CT Renal Stone Study  Result Date: 09/24/2022 CLINICAL DATA:  Abdominal and flank pain.  Stone suspected. EXAM: CT ABDOMEN AND PELVIS WITHOUT CONTRAST TECHNIQUE: Multidetector CT imaging of the abdomen and pelvis was performed following the standard protocol without IV contrast. RADIATION DOSE REDUCTION: This exam was performed according to the departmental dose-optimization program which includes automated exposure control, adjustment of the mA and/or kV according to patient size and/or use of iterative reconstruction technique. COMPARISON:  CT with IV contrast 02/13/2010 FINDINGS: Lower chest: Lung bases again demonstrate mild nonspecific chronic interstitial thickening consistent with chronic interstitial disease. There has been no apparent progression. No focal pneumonia is seen.  There is mild bronchial thickening. The  heart is slightly enlarged. There is no substantial pericardial fluid. Hepatobiliary: The unenhanced liver is homogeneous. Gallbladder and bile ducts are unremarkable. Pancreas: Unremarkable without contrast. Spleen: Unremarkable without contrast.  No splenomegaly. Adrenals/Urinary Tract: There is no adrenal mass. No focal abnormality in the unenhanced renal cortex. There is no evidence of urinary stones or obstruction. The bladder is distended with the dome reaching the L4-5 level. No wall thickening or intravesical stone is seen. There is mild bilateral increased perinephric stranding. This could be senescent and chronic in nature or could indicate an inflammatory process. Please correlate with urinalysis. Stomach/Bowel: No dilatation or wall thickening, including the appendix. Moderate fecal stasis. No mesenteric inflammatory changes. Uncomplicated sigmoid diverticulosis. Vascular/Lymphatic: Aortic atherosclerosis. No enlarged abdominal or pelvic lymph nodes. Reproductive: There is a 2 cm calcified fibroid of the left uterine fundus. Similar sized partially calcified subserosal fibroid of the dorsal lower uterine segment. No adnexal mass is seen. Other: No abdominal wall hernia or abnormality. No abdominopelvic ascites. Musculoskeletal: Degenerative changes and mild dextroscoliosis lumbar spine. Mild-to-moderate hip DJD. There is bridging enthesopathy in the lower thoracic spine. Severe acquired spinal stenosis L3-4 and L4-5 is noted with multilevel  degenerative lumbar foraminal compromise. Moderate hip DJD changes. No acute osseous abnormality is seen.  No aggressive lesions. IMPRESSION: 1. No evidence of urinary stones or obstruction. 2. Increased perinephric stranding which could be senescent and chronic in nature or could indicate an inflammatory process. Correlate with urinalysis. 3. Distended urinary bladder without wall thickening or intravesical stone. 4. Constipation and diverticulosis. 5. Aortic  atherosclerosis. 6. Chronic interstitial changes in the lung bases. No visible progression of these findings since 02/13/2010, at least in the lung bases. There is no prior chest CT on file. Aortic Atherosclerosis (ICD10-I70.0). Electronically Signed   By: Almira Bar M.D.   On: 09/24/2022 00:34   CT HEAD WO CONTRAST ( )  Result Date: 09/23/2022 CLINICAL DATA:  Fall, Head trauma, minor (Age >= 65y) EXAM: CT HEAD WITHOUT CONTRAST TECHNIQUE: Contiguous axial images were obtained from the base of the skull through the vertex without intravenous contrast. RADIATION DOSE REDUCTION: This exam was performed according to the departmental dose-optimization program which includes automated exposure control, adjustment of the mA and/or kV according to patient size and/or use of iterative reconstruction technique. COMPARISON:  MRI 11/19/2011 FINDINGS: Brain: Normal anatomic configuration. 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 No acute intracranial hemorrhage or infarct. No abnormal mass effect or midline shift. No abnormal intra or extra-axial mass lesion or fluid collection. Ventricular size is normal. Cerebellum is unremarkable. Vascular: No hyperdense vessel or unexpected calcification. Skull: Normal. Negative for fracture or focal lesion. Sinuses/Orbits: No acute finding. Other: Mastoid air cells and middle ear cavities are clear. There is progressive hypertrophy of the cruciform ligamentous complex posterior to the odontoid with progressive canal narrowing and cord compression at the craniocervical junction. IMPRESSION: 1. No acute intracranial abnormality. 2. 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 3. 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 Electronically Signed   By: Helyn Numbers M.D.   On: 09/23/2022 22:01   DG Chest 2  View  Result Date: 09/23/2022 CLINICAL DATA:  Recurrent falls EXAM: CHEST - 2 VIEW COMPARISON:  Chest radiograph dated 05/04/2022 FINDINGS: Normal lung volumes. No focal consolidations. No pleural effusion or pneumothorax. The heart size and mediastinal contours are within normal limits. No radiographic finding of acute displaced fracture. IMPRESSION: No radiographic finding of acute displaced fracture. No focal consolidations. Electronically Signed   By: Agustin Cree M.D.   On: 09/23/2022 20:38     Final Assessment and Plan:   Reassessed at bedside.  Is having some symptomatic improvement with IV fluids has been able to urinate.  However lab work with concern for possible emergent pathology.  Pending results of renal study at time of handoff to oncoming team.  Anticipate inpatient management of new AKI with symptomatic dizziness.    Clinical Impression:  1. Dizziness   2. Vertigo      Data Unavailable   Final Clinical Impression(s) / ED Diagnoses Final diagnoses:  Dizziness  Vertigo    Rx / DC Orders ED Discharge Orders     None         Glyn Ade, MD 09/24/22 [id number]

## 2022-09-23 NOTE — ED Notes (Signed)
Patient transported to X-ray 

## 2022-09-23 NOTE — Telephone Encounter (Signed)
Pt called at the end of the day complaining of being nauseous, dizzy and she has fallen down several times recently. I transferred her over to nurse triage.  She had an ov today 09/23/22 with [name], but was a no show because she was feeling so bad.

## 2022-09-23 NOTE — ED Notes (Signed)
Patient transported to CT 

## 2022-09-23 NOTE — ED Triage Notes (Signed)
Ongoing dizziness for 6 months. Recently been having multiple falls with 3 reported falls today. No LOC. No injuries. Denies hitting head. Alert and oriented x 4. On Plavix.

## 2022-09-24 ENCOUNTER — Other Ambulatory Visit: Payer: Self-pay

## 2022-09-24 ENCOUNTER — Emergency Department (HOSPITAL_COMMUNITY): Payer: Medicare HMO

## 2022-09-24 ENCOUNTER — Inpatient Hospital Stay (HOSPITAL_BASED_OUTPATIENT_CLINIC_OR_DEPARTMENT_OTHER): Payer: Medicare HMO

## 2022-09-24 ENCOUNTER — Ambulatory Visit: Payer: Medicare PPO | Admitting: Nurse Practitioner

## 2022-09-24 DIAGNOSIS — R42 Dizziness and giddiness: Secondary | ICD-10-CM | POA: Diagnosis not present

## 2022-09-24 DIAGNOSIS — R55 Syncope and collapse: Secondary | ICD-10-CM

## 2022-09-24 DIAGNOSIS — N179 Acute kidney failure, unspecified: Secondary | ICD-10-CM | POA: Diagnosis not present

## 2022-09-24 DIAGNOSIS — M542 Cervicalgia: Secondary | ICD-10-CM | POA: Diagnosis not present

## 2022-09-24 DIAGNOSIS — M4802 Spinal stenosis, cervical region: Secondary | ICD-10-CM | POA: Diagnosis not present

## 2022-09-24 DIAGNOSIS — M47812 Spondylosis without myelopathy or radiculopathy, cervical region: Secondary | ICD-10-CM | POA: Diagnosis not present

## 2022-09-24 DIAGNOSIS — G9389 Other specified disorders of brain: Secondary | ICD-10-CM | POA: Diagnosis not present

## 2022-09-24 LAB — URINALYSIS, ROUTINE W REFLEX MICROSCOPIC
Bilirubin Urine: NEGATIVE
Glucose, UA: NEGATIVE mg/dL
Hgb urine dipstick: NEGATIVE
Ketones, ur: NEGATIVE mg/dL
Leukocytes,Ua: NEGATIVE
Nitrite: NEGATIVE
Protein, ur: NEGATIVE mg/dL
Specific Gravity, Urine: 1.008 (ref 1.005–1.030)
pH: 5 (ref 5.0–8.0)

## 2022-09-24 LAB — CBC
HCT: 36.4 % (ref 36.0–46.0)
Hemoglobin: 11.8 g/dL — ABNORMAL LOW (ref 12.0–15.0)
MCH: 29.1 pg (ref 26.0–34.0)
MCHC: 32.4 g/dL (ref 30.0–36.0)
MCV: 89.9 fL (ref 80.0–100.0)
Platelets: 200 10*3/uL (ref 150–400)
RBC: 4.05 MIL/uL (ref 3.87–5.11)
RDW: 16 % — ABNORMAL HIGH (ref 11.5–15.5)
WBC: 3.8 10*3/uL — ABNORMAL LOW (ref 4.0–10.5)
nRBC: 0 % (ref 0.0–0.2)

## 2022-09-24 LAB — GLUCOSE, CAPILLARY
Glucose-Capillary: 134 mg/dL — ABNORMAL HIGH (ref 70–99)
Glucose-Capillary: 109 mg/dL — ABNORMAL HIGH (ref 70–99)

## 2022-09-24 LAB — ECHOCARDIOGRAM COMPLETE
Area-P 1/2: 3.42 cm2
Calc EF: 45.2 %
Height: 65 in
MV M vel: 5.96 m/s
MV Peak grad: 142.1 mmHg
Radius: 0.35 cm
S' Lateral: 2.8 cm
Single Plane A2C EF: 44.2 %
Single Plane A4C EF: 50.5 %
Weight: 2045.87 oz

## 2022-09-24 LAB — CREATININE, SERUM
Creatinine, Ser: 1.18 mg/dL — ABNORMAL HIGH (ref 0.44–1.00)
GFR, Estimated: 49 mL/min — ABNORMAL LOW (ref >60)

## 2022-09-24 LAB — BRAIN NATRIURETIC PEPTIDE: B Natriuretic Peptide: 142 pg/mL — ABNORMAL HIGH (ref 0.0–100.0)

## 2022-09-24 LAB — VITAMIN B12: Vitamin B-12: 447 pg/mL (ref 180–914)

## 2022-09-24 LAB — CK: Total CK: 178 U/L (ref 38–234)

## 2022-09-24 MED ORDER — ONDANSETRON HCL 4 MG/2ML IJ SOLN: 4.0000 mg | Freq: Four times a day (QID) | INTRAMUSCULAR | Status: DC | PRN

## 2022-09-24 MED ORDER — ENOXAPARIN SODIUM 30 MG/0.3ML IJ SOSY
30.0000 mg | PREFILLED_SYRINGE | INTRAMUSCULAR | Status: DC
  Administered 2022-09-24: 30 mg via SUBCUTANEOUS
  Filled 2022-09-24: qty 0.3

## 2022-09-24 MED ORDER — DICLOFENAC SODIUM 1 % EX GEL: 2.0000 g | Freq: Four times a day (QID) | CUTANEOUS | Status: DC | PRN

## 2022-09-24 MED ORDER — SODIUM CHLORIDE 0.9% FLUSH: 3.0000 mL | INTRAVENOUS | Status: DC | PRN

## 2022-09-24 MED ORDER — PREGABALIN 75 MG PO CAPS
75.0000 mg | ORAL_CAPSULE | Freq: Every day | ORAL | Status: DC
  Administered 2022-09-24 – 2022-09-25 (×2): 75 mg via ORAL
  Filled 2022-09-24 (×2): qty 1

## 2022-09-24 MED ORDER — SODIUM CHLORIDE 0.9 % IV SOLN: 250.0000 mL | INTRAVENOUS | Status: DC | PRN

## 2022-09-24 MED ORDER — MELATONIN 5 MG PO TABS: 5.0000 mg | ORAL_TABLET | Freq: Every evening | ORAL | Status: DC | PRN

## 2022-09-24 MED ORDER — LACTATED RINGERS IV SOLN: INTRAVENOUS | Status: DC

## 2022-09-24 MED ORDER — ENSURE ENLIVE PO LIQD
237.0000 mL | Freq: Two times a day (BID) | ORAL | Status: DC
  Administered 2022-09-25: 237 mL via ORAL

## 2022-09-24 MED ORDER — CARVEDILOL 6.25 MG PO TABS
6.2500 mg | ORAL_TABLET | Freq: Two times a day (BID) | ORAL | Status: DC
  Administered 2022-09-24 – 2022-09-25 (×2): 6.25 mg via ORAL
  Filled 2022-09-24 (×2): qty 1

## 2022-09-24 MED ORDER — POLYETHYLENE GLYCOL 3350 17 G PO PACK: 17.0000 g | PACK | Freq: Every day | ORAL | Status: DC | PRN

## 2022-09-24 MED ORDER — GADOBUTROL 1 MMOL/ML IV SOLN
5.5000 mL | Freq: Once | INTRAVENOUS | Status: AC | PRN
  Administered 2022-09-24: 5.5 mL via INTRAVENOUS

## 2022-09-24 MED ORDER — ACETAMINOPHEN 650 MG RE SUPP: 650.0000 mg | Freq: Four times a day (QID) | RECTAL | Status: DC | PRN

## 2022-09-24 MED ORDER — ACETAMINOPHEN 325 MG PO TABS
650.0000 mg | ORAL_TABLET | Freq: Four times a day (QID) | ORAL | Status: DC | PRN
  Administered 2022-09-24: 650 mg via ORAL
  Filled 2022-09-24: qty 2

## 2022-09-24 MED ORDER — ATORVASTATIN CALCIUM 40 MG PO TABS: 40.0000 mg | ORAL_TABLET | Freq: Every day | ORAL | Status: DC

## 2022-09-24 MED ORDER — CITALOPRAM HYDROBROMIDE 20 MG PO TABS
20.0000 mg | ORAL_TABLET | Freq: Every day | ORAL | Status: DC
  Administered 2022-09-24 – 2022-09-25 (×2): 20 mg via ORAL
  Filled 2022-09-24 (×2): qty 1

## 2022-09-24 MED ORDER — ENOXAPARIN SODIUM 40 MG/0.4ML IJ SOSY
40.0000 mg | PREFILLED_SYRINGE | INTRAMUSCULAR | Status: DC
  Administered 2022-09-25: 40 mg via SUBCUTANEOUS
  Filled 2022-09-24: qty 0.4

## 2022-09-24 MED ORDER — PANTOPRAZOLE SODIUM 40 MG PO TBEC
40.0000 mg | DELAYED_RELEASE_TABLET | Freq: Two times a day (BID) | ORAL | Status: DC
  Administered 2022-09-24 – 2022-09-25 (×3): 40 mg via ORAL
  Filled 2022-09-24 (×3): qty 1

## 2022-09-24 MED ORDER — CLOPIDOGREL BISULFATE 75 MG PO TABS
75.0000 mg | ORAL_TABLET | Freq: Every day | ORAL | Status: DC
  Administered 2022-09-24 – 2022-09-25 (×2): 75 mg via ORAL
  Filled 2022-09-24 (×2): qty 1

## 2022-09-24 MED ORDER — ONDANSETRON HCL 4 MG PO TABS: 4.0000 mg | ORAL_TABLET | Freq: Four times a day (QID) | ORAL | Status: DC | PRN

## 2022-09-24 MED ORDER — SODIUM CHLORIDE 0.9% FLUSH
3.0000 mL | Freq: Two times a day (BID) | INTRAVENOUS | Status: DC
  Administered 2022-09-24 – 2022-09-25 (×3): 3 mL via INTRAVENOUS

## 2022-09-24 NOTE — Progress Notes (Signed)
  Echocardiogram 2D Echocardiogram has been performed.  Janalyn Harder 09/24/2022, 3:01 PM

## 2022-09-24 NOTE — Progress Notes (Signed)
PT Cancellation Note  Patient Details Name: Lisa [name] MRN: [id number] DOB: 30-Jul-1950   Cancelled Treatment:    Reason Eval/Treat Not Completed: Other (comment) PT orders received, chart reviewed. Attempted to see pt for PT evaluation but pt having echo performed. Will f/u as able.  Aleda Grana, PT, DPT 09/24/22, 2:28 PM   Sandi Mariscal 09/24/2022, 2:28 PM

## 2022-09-24 NOTE — TOC Initial Note (Addendum)
Transition of Care [facility]) - Initial/Assessment Note    Patient Details  Name: Lisa [name] MRN: [id number] Date of Birth: 1951-02-15  [facility] CM/SW Contact:    Janae Bridgeman, RN Phone Number: 09/24/2022, 4:44 PM  Clinical Narrative:                 CM met with the patient at the bedside to discuss TOC needs.  The patient lives alone and was admitted for dizziness and C-spine stenosis.  The patient returned from procedure and is pending PT/OT eval.  The patient was provided with Medicare Observation notice and Code 44 was completed.  The patient has no home health services at this time and I will meet with the patient post-eval by PT to determine needs for assistance and DME in the home to prevent falls.  The patient does not drive but has assistance from a nurse that is a friend to assist with grocery shopping.  Message sent to the attending physician that I will follow up with the patient in the morning to set up with home health and DME after PT evaluates the patient.   Patient will need assistance to prevent re-hospitalization most likely.  Expected Discharge Plan: Home w Home Health Services Barriers to Discharge: Continued Medical Work up   Patient Goals and CMS Choice Patient states their goals for this hospitalization and ongoing recovery are:: To return home CMS Medicare.gov Compare Post Acute Care list provided to:: Patient Choice offered to / list presented to : Patient [place] ownership interest in [facility].provided to:: Patient    Expected Discharge Plan and Services   Discharge Planning Services: CM Consult   Living arrangements for the past 2 months: Apartment                                      Prior Living Arrangements/Services Living arrangements for the past 2 months: Apartment Lives with:: Self Patient language and need for interpreter reviewed:: Yes Do you feel safe going back to the place where  you live?: Yes      Need for Family Participation in Patient Care: Yes (Comment) Care giver support system in place?: Yes (comment)   Criminal Activity/Legal Involvement Pertinent to Current Situation/Hospitalization: No - Comment as needed  Activities of Daily Living      Permission Sought/Granted Permission sought to share information with : Case Manager, Magazine features editor                Emotional Assessment Appearance:: Appears stated age Attitude/Demeanor/Rapport: Gracious Affect (typically observed): Accepting Orientation: : Oriented to Self, Oriented to Place, Oriented to  Time, Oriented to Situation Alcohol / Substance Use: Not Applicable Psych Involvement: No (comment)  Admission diagnosis:  Dizziness [R42] Vertigo [R42] AKI (acute kidney injury) (HCC) [N17.9] Acute kidney injury (HCC) [N17.9] Patient Active Problem List   Diagnosis Date Noted   Acute kidney injury (HCC) 09/24/2022   AKI (acute kidney injury) (HCC) 09/24/2022   Dizziness 06/19/2022   Upper GI bleed 05/04/2022   Anemia, posthemorrhagic, acute 05/04/2022   Hematemesis without nausea 05/04/2022   Antiplatelet or antithrombotic long-term use 05/04/2022   Acute gastric ulcer with hemorrhage 05/04/2022   Chronic combined systolic and diastolic heart failure (HCC) 04/17/2022   Depression, major, single episode, mild (HCC) 04/17/2022   Controlled type 2 diabetes with neuropathy (HCC) 04/17/2022   Housing instability, currently  housed, at risk for homelessness 04/17/2022   Fibromyalgia 08/29/2020   Hyperlipidemia associated with type 2 diabetes mellitus (HCC) 06/20/2020   Osteoarthritis 06/20/2020   Normocytic normochromic anemia 11/18/2018   Insomnia 04/25/2013   Allergic rhinitis 08/10/2012   Left low back pain 06/26/2012   Numbness and tingling in both hands 06/26/2012   S4 (fourth heart sound) 05/02/2012   History of CVA (cerebrovascular accident) 11/20/2011   Essential  hypertension 11/20/2011   Hyperlipidemia 11/20/2011   Former light cigarette smoker (1-9 per day) 11/20/2011   PCP:  Gerre Scull, NP Pharmacy:   [facility] - [place], [place] - [address] [place] [address] Phone: [phone] Fax: [phone]     Social Determinants of Health (SDOH) Social History: SDOH Screenings   Food Insecurity: Food Insecurity Present (08/05/2022)  Housing: Low Risk  (07/22/2022)  Transportation Needs: No Transportation Needs (07/22/2022)  Utilities: Not At Risk (04/24/2022)  Alcohol Screen: Low Risk  (04/24/2022)  Depression (PHQ2-9): Low Risk  (09/02/2022)  Financial Resource Strain: Low Risk  (06/09/2022)  Recent Concern: Financial Resource Strain - Medium Risk (04/24/2022)  Physical Activity: Inactive (06/09/2022)  Social Connections: Moderately Isolated (04/24/2022)  Stress: No Stress Concern Present (06/09/2022)  Tobacco Use: Medium Risk (09/23/2022)   SDOH Interventions:     Readmission Risk Interventions    09/24/2022    4:44 PM  Readmission Risk Prevention Plan  Transportation Screening Complete  PCP or Specialist Appt within 5-7 Days Complete  Home Care Screening Complete  Medication Review (RN CM) Complete

## 2022-09-24 NOTE — ED Notes (Signed)
ED TO INPATIENT HANDOFF REPORT  ED Nurse Name and Phone #: Vernona Rieger [phone]  S Name/Age/Gender Lisa [name] 72 y.o. female Room/Bed: 044C/044C  Code Status   Code Status: Full Code  Home/SNF/Other Home Patient oriented to: self, place, time, and situation Is this baseline? Yes   Triage Complete: Triage complete  Chief Complaint Acute kidney injury [facility]) [N17.9]  Triage Note Ongoing dizziness for 6 months. Recently been having multiple falls with 3 reported falls today. No LOC. No injuries. Denies hitting head. Alert and oriented x 4. On Plavix.   Allergies Allergies  Allergen Reactions   Ace Inhibitors     Elevated Cr for 0.8 to 1.4 (30 percent or greater rise in serum creatinine)    Aspirin Other (See Comments)    Irritates ulcers   Codeine     "upsets my ulcers"   Hydrocodone     "upsets my ulcers"    Level of Care/Admitting Diagnosis ED Disposition     ED Disposition  Admit   Condition  --   Comment  Hospital Area: [facility] [[id number]]  Level of Care: Telemetry Medical [104]  May admit patient to [name] or [facility] if equivalent level of care is available:: Yes  Covid Evaluation: Asymptomatic - no recent exposure (last 10 days) testing not required  Diagnosis: Acute kidney injury [facility]) [[id number]]  Admitting Physician: Conard Novak  Attending Physician: Conard Novak  Certification:: I certify this patient will need inpatient services for at least 2 midnights          B Medical/Surgery History Past Medical History:  Diagnosis Date   ARF (acute renal failure) (HCC) 11/18/2018   Coronary artery disease    Diabetes mellitus (HCC) 2007   High cholesterol 2007   Hypertension    Peptic ulcer 1965   Stroke [facility])    Past Surgical History:  Procedure Laterality Date   CESAREAN SECTION     ESOPHAGOGASTRODUODENOSCOPY (EGD) WITH PROPOFOL N/A 05/04/2022   Procedure: ESOPHAGOGASTRODUODENOSCOPY (EGD) WITH  PROPOFOL;  Surgeon: Benancio Deeds, MD;  Location: [facility] ENDOSCOPY;  Service: Gastroenterology;  Laterality: N/A;   RIGHT/LEFT HEART CATH AND CORONARY ANGIOGRAPHY N/A 11/19/2018   Procedure: RIGHT/LEFT HEART CATH AND CORONARY ANGIOGRAPHY;  Surgeon: Orpah Cobb, MD;  Location: [facility] INVASIVE CV LAB;  Service: Cardiovascular;  Laterality: N/A;     A IV Location/Drains/Wounds Patient Lines/Drains/Airways Status     Active Line/Drains/Airways     Name Placement date Placement time Site Days   Peripheral IV 09/23/22 Right Antecubital 09/23/22  1845  Antecubital  1            Intake/Output Last 24 hours  Intake/Output Summary (Last 24 hours) at 09/24/2022 0958 Last data filed at 09/24/2022 0013 Gross per 24 hour  Intake 1500 ml  Output --  Net 1500 ml    Labs/Imaging Results for orders placed or performed during the hospital encounter of 09/23/22 (from the past 48 hour(s))  CBC with Differential     Status: Abnormal   Collection Time: 09/23/22  6:57 PM  Result Value Ref Range   WBC 4.1 4.0 - 10.5 K/uL   RBC 3.98 3.87 - 5.11 MIL/uL   Hemoglobin 11.3 (L) 12.0 - 15.0 g/dL   HCT 64.3 (L) 32.9 - 51.8 %   MCV 88.2 80.0 - 100.0 fL   MCH 28.4 26.0 - 34.0 pg   MCHC 32.2 30.0 - 36.0 g/dL   RDW 84.1 (H) 66.0 - 63.0 %  Platelets 208 150 - 400 K/uL   nRBC 0.0 0.0 - 0.2 %   Neutrophils Relative % 58 %   Neutro Abs 2.4 1.7 - 7.7 K/uL   Lymphocytes Relative 29 %   Lymphs Abs 1.2 0.7 - 4.0 K/uL   Monocytes Relative 10 %   Monocytes Absolute 0.4 0.1 - 1.0 K/uL   Eosinophils Relative 2 %   Eosinophils Absolute 0.1 0.0 - 0.5 K/uL   Basophils Relative 1 %   Basophils Absolute 0.0 0.0 - 0.1 K/uL   Immature Granulocytes 0 %   Abs Immature Granulocytes 0.01 0.00 - 0.07 K/uL    Comment: Performed at [facility], [address], [place], [place] [address]  Comprehensive metabolic panel     Status: Abnormal   Collection Time: 09/23/22  6:57 PM  Result Value Ref Range   Sodium 135  135 - 145 mmol/L   Potassium 4.1 3.5 - 5.1 mmol/L   Chloride 104 98 - 111 mmol/L   CO2 22 22 - 32 mmol/L   Glucose, Bld 136 (H) 70 - 99 mg/dL    Comment: Glucose reference range applies only to samples taken after fasting for at least 8 hours.   BUN 27 (H) 8 - 23 mg/dL   Creatinine, Ser 6.04 (H) 0.44 - 1.00 mg/dL   Calcium 8.9 8.9 - 54.0 mg/dL   Total Protein 7.5 6.5 - 8.1 g/dL   Albumin 4.1 3.5 - 5.0 g/dL   AST 18 15 - 41 U/L   ALT 13 0 - 44 U/L   Alkaline Phosphatase 66 38 - 126 U/L   Total Bilirubin 0.6 0.3 - 1.2 mg/dL   GFR, Estimated 31 (L) >60 mL/min    Comment: (NOTE) Calculated using the CKD-EPI Creatinine Equation (2021)    Anion gap 9 5 - 15    Comment: Performed at [facility], [address] [place], [place] [address]  Troponin I (High Sensitivity)     Status: None   Collection Time: 09/23/22  6:57 PM  Result Value Ref Range   Troponin I (High Sensitivity) 8 <18 ng/L    Comment: (NOTE) Elevated high sensitivity troponin I (hsTnI) values and significant  changes across serial measurements may suggest ACS but many other  chronic and acute conditions are known to elevate hsTnI results.  Refer to the "Links" section for chest pain algorithms and additional  guidance. Performed at [facility], [address] [place], [place] [address]   Troponin I (High Sensitivity)     Status: None   Collection Time: 09/23/22  8:45 PM  Result Value Ref Range   Troponin I (High Sensitivity) 8 <18 ng/L    Comment: (NOTE) Elevated high sensitivity troponin I (hsTnI) values and significant  changes across serial measurements may suggest ACS but many other  chronic and acute conditions are known to elevate hsTnI results.  Refer to the "Links" section for chest pain algorithms and additional  guidance. Performed at [facility], [address] [place], [place] [address]   Urinalysis, Routine w reflex microscopic -Urine, Clean Catch     Status: Abnormal    Collection Time: 09/24/22 12:00 AM  Result Value Ref Range   Color, Urine YELLOW YELLOW   APPearance HAZY (A) CLEAR   Specific Gravity, Urine 1.008 1.005 - 1.030   pH 5.0 5.0 - 8.0   Glucose, UA NEGATIVE NEGATIVE mg/dL   Hgb urine dipstick NEGATIVE NEGATIVE   Bilirubin Urine  NEGATIVE NEGATIVE   Ketones, ur NEGATIVE NEGATIVE mg/dL   Protein, ur NEGATIVE NEGATIVE mg/dL   Nitrite NEGATIVE NEGATIVE   Leukocytes,Ua NEGATIVE NEGATIVE    Comment: Performed at [facility], [address] [place], [place] [address]   MR Cervical Spine W and Wo Contrast  Result Date: 09/24/2022 CLINICAL DATA:  73 year old female status post fall. 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 TIA. EXAM: MRI CERVICAL SPINE WITHOUT AND WITH CONTRAST TECHNIQUE: Multiplanar and multiecho pulse sequences of the cervical spine, to include the craniocervical junction and cervicothoracic junction, were obtained without and with intravenous contrast. CONTRAST:  5.2mL GADAVIST GADOBUTROL 1 MMOL/ML IV SOLN COMPARISON:  Brain MRI today, and 11/19/2011. FINDINGS: Alignment: Maintained cervical lordosis. No significant scoliosis or spondylolisthesis. Vertebrae: Background bone marrow signal is within normal limits. Heterogeneous abnormal marrow signal in the odontoid process with patchy enhancement, T2 and STIR hyperintensity appears to be degenerative in nature, with surrounding dark T2 and STIR signal bulky soft tissue pannus, up to 7 mm thickness now (versus 6-7 mm also on the 2013 MRI). No enhancement of the pannus. Adjacent skull base marrow signal remains normal. No regional inflammation. 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 No other marrow edema, enhancement, or acute osseous abnormality. Cord: Mass effect on the cervicomedullary junction (series 5,  image 8, series 8 image 5), and diffuse mild cervical spinal stenosis elsewhere (also series 5, image 8). But no associated spinal cord edema or expansion. And no convincing myelomalacia at this time. No abnormal dural thickening or intradural enhancement. Posterior Fossa, vertebral arteries, paraspinal tissues: Brain is reported separately today. Preserved major vascular flow voids in the neck. Negative visible neck soft tissues, lung apices. Disc levels: C1-C2: Mild to moderate stenosis at this level in the cervicomedullary junction related to bulky chronic pannus. Mass effect but no lower brainstem or spinal cord signal abnormality identified. C2-C3: Mild multifactorial spinal stenosis and spinal cord mass effect at this level related to central disc or disc osteophyte complex, moderate facet and ligament flavum hypertrophy. Moderate bilateral C3 foraminal stenosis C3-C4: Mild to moderate multifactorial spinal stenosis (series 8, image 17) related to broad-based central disc osteophyte complex and up to moderate facet and ligament flavum hypertrophy. Spinal cord mass effect. Mild to moderate bilateral C4 foraminal stenosis. C4-C5: Mild multifactorial spinal stenosis here related to broad-based central disc osteophyte complex and mild to moderate facet and ligament flavum hypertrophy. Mild to moderate bilateral C5 foraminal stenosis. C5-C6: Mild spinal stenosis related to broad-based central disc osteophyte complex and mild to moderate facet and ligament flavum hypertrophy. Moderate to severe left and moderate right C6 foraminal stenosis. C6-C7: Right paracentral disc or disc osteophyte complex (series 8, image 30) and up to moderate facet and ligament flavum hypertrophy. Mild spinal stenosis here, mild if any cord mass effect. Mild to moderate left but no convincing right C7 foraminal stenosis. C7-T1: Mild foraminal endplate spurring. Mild to moderate facet and ligament flavum hypertrophy. No significant spinal  stenosis. Upper thoracic spinal stenosis visible at T2-T3 appears related to mild spondylolisthesis with minor disc bulging but moderate to severe facet and ligament flavum hypertrophy. Mild spinal cord mass effect there, no definite cord signal abnormality. Moderate to severe left T2 foraminal stenosis. IMPRESSION: 1. Bulky chronic pannus at the cervicomedullary junction with heterogeneous degenerative appearing marrow signal changes in the underlying odontoid process. Query rheumatoid arthritis  or other inflammatory arthropathy. Pannus thickness up to 7 mm, but minimally progressed compared to a 2013 brain MRI. Subsequent mild to moderate stenosis and mass effect on the cervicomedullary junction, but no lower brainstem signal changes are associated. 2. Similar diffuse degenerative cervical spinal stenosis C2-C3 through C5-C6 ranging from mild to moderate with multilevel mass effect on the spinal cord. But no cervical cord edema or myelomalacia identified. 3. Associated moderate or severe neural foraminal stenosis at the bilateral C3 and C5 nerve levels. Electronically Signed   By: Odessa Fleming M.D.   On: 09/24/2022 05:49   MR Brain W and Wo Contrast  Result Date: 09/24/2022 CLINICAL DATA:  72 year old female status post fall. 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 TIA. EXAM: MRI HEAD WITHOUT AND WITH CONTRAST TECHNIQUE: Multiplanar, multiecho pulse sequences of the brain and surrounding structures were obtained without and with intravenous contrast. CONTRAST:  5.64mL GADAVIST GADOBUTROL 1 MMOL/ML IV SOLN COMPARISON:  Head CT yesterday. Brain MRI 11/19/2011. Cervical spine MRI today reported separately. FINDINGS: Brain: Dark T2, intermediate T1 signal soft tissue posterior to the odontoid at the ventral cervicomedullary junction is chronic but progressed since 2013. No enhancement following contrast (series 22, image 7). This appears degenerative,  and see additional details on cervical spine MRI reported separately. Cerebral volume has not significantly changed since 2013 and is normal for age. No restricted diffusion to suggest acute infarction. No midline shift, mass effect, evidence of mass lesion, ventriculomegaly, extra-axial collection or acute intracranial hemorrhage. Negative pituitary. Chronic encephalomalacia in the right brainstem related to patchy infarct there which was acute on the 2013 MRI (series 15, image 11 today). But elsewhere gray and white matter signal remains within normal limits for age throughout the brain. No cortical encephalomalacia or chronic cerebral blood products identified. No abnormal enhancement identified.  No dural thickening. Vascular: Major intracranial vascular flow voids are stable since 2013. The right ICA appears somewhat non dominant. Skull and upper cervical spine: Cervicomedullary junction stenosis, see cervical spine MRI reported separately. Degenerative sclerosis at the odontoid but otherwise visible bone marrow signal remains normal. Sinuses/Orbits: Negative. Paranasal Visualized paranasal sinuses and mastoids are stable and well aerated. Other: Visible internal auditory structures appear normal. Negative visible scalp and face. IMPRESSION: 1. No acute intracranial abnormality. 2. Bulky chronic, progressed pannus at the dorsal odontoid with cervicomedullary junction stenosis. See Cervical Spine MRI reported separately. 3. Chronic right brainstem infarct. But elsewhere MRI appearance of the brain remains normal for age. Electronically Signed   By: Odessa Fleming M.D.   On: 09/24/2022 05:37   CT Cervical Spine Wo Contrast  Result Date: 09/24/2022 CLINICAL DATA:  Neck pain, cervical spondylosis EXAM: CT CERVICAL SPINE WITHOUT CONTRAST TECHNIQUE: Multidetector CT imaging of the cervical spine was performed without intravenous contrast. Multiplanar CT image reconstructions were also generated. RADIATION DOSE  REDUCTION: This exam was performed according to the departmental dose-optimization program which includes automated exposure control, adjustment of the mA and/or kV according to patient size and/or use of iterative reconstruction technique. COMPARISON:  None Available. FINDINGS: Alignment: Normal. Skull base and vertebrae: Craniocervical alignment is normal. The atlantodental interval is not widened. No acute fracture of the cervical spine. Vertebral body height is preserved. Soft tissues and spinal canal: Hypertrophy of the ligamentous complex posterior to the odontoid process results in marked narrowing the spinal canal at the craniocervical junction with flattening of the spinal cord, best seen on axial image # 18/9. The  spinal canal is diffusely congenitally narrowed. Superimposed posterior disc osteophyte complex at C5-6 and central disc bulges at C3-4 and to a lesser extent C2-3 and C4-5 result in moderate to severe central canal stenosis with flattening of the thecal sac and an AP diameter of 5-6 mm. No canal hematoma. No prevertebral soft tissue swelling or paraspinal fluid collections. Disc levels: There is endplate remodeling throughout the cervical spine in keeping with changes of advanced degenerative disc disease. Vacuum disc phenomena noted at C3-C7 prevertebral soft tissues are not thickened on sagittal reformats. Mild to moderate left neuroforaminal narrowing at C5-6. No other significant neuroforaminal narrowing identified. Upper chest: Negative. Other: None IMPRESSION: 1. No acute fracture or listhesis of the cervical spine. 2. Hypertrophy of the ligamentous complex posterior to the odontoid process results in marked narrowing the spinal canal at the craniocervical junction with flattening of the spinal cord. 3. 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 4. Mild to moderate left  neuroforaminal narrowing at C5-6. Electronically Signed   By: Helyn Numbers M.D.   On: 09/24/2022 01:12   CT Renal Stone Study  Result Date: 09/24/2022 CLINICAL DATA:  Abdominal and flank pain.  Stone suspected. EXAM: CT ABDOMEN AND PELVIS WITHOUT CONTRAST TECHNIQUE: Multidetector CT imaging of the abdomen and pelvis was performed following the standard protocol without IV contrast. RADIATION DOSE REDUCTION: This exam was performed according to the departmental dose-optimization program which includes automated exposure control, adjustment of the mA and/or kV according to patient size and/or use of iterative reconstruction technique. COMPARISON:  CT with IV contrast 02/13/2010 FINDINGS: Lower chest: Lung bases again demonstrate mild nonspecific chronic interstitial thickening consistent with chronic interstitial disease. There has been no apparent progression. No focal pneumonia is seen.  There is mild bronchial thickening. The heart is slightly enlarged. There is no substantial pericardial fluid. Hepatobiliary: The unenhanced liver is homogeneous. Gallbladder and bile ducts are unremarkable. Pancreas: Unremarkable without contrast. Spleen: Unremarkable without contrast.  No splenomegaly. Adrenals/Urinary Tract: There is no adrenal mass. No focal abnormality in the unenhanced renal cortex. There is no evidence of urinary stones or obstruction. The bladder is distended with the dome reaching the L4-5 level. No wall thickening or intravesical stone is seen. There is mild bilateral increased perinephric stranding. This could be senescent and chronic in nature or could indicate an inflammatory process. Please correlate with urinalysis. Stomach/Bowel: No dilatation or wall thickening, including the appendix. Moderate fecal stasis. No mesenteric inflammatory changes. Uncomplicated sigmoid diverticulosis. Vascular/Lymphatic: Aortic atherosclerosis. No enlarged abdominal or pelvic lymph nodes. Reproductive: There is a  2 cm calcified fibroid of the left uterine fundus. Similar sized partially calcified subserosal fibroid of the dorsal lower uterine segment. No adnexal mass is seen. Other: No abdominal wall hernia or abnormality. No abdominopelvic ascites. Musculoskeletal: Degenerative changes and mild dextroscoliosis lumbar spine. Mild-to-moderate hip DJD. There is bridging enthesopathy in the lower thoracic spine. Severe acquired spinal stenosis L3-4 and L4-5 is noted with multilevel degenerative lumbar foraminal compromise. Moderate hip DJD changes. No acute osseous abnormality is seen.  No aggressive lesions. IMPRESSION: 1. No evidence of urinary stones or obstruction. 2. Increased perinephric stranding which could be senescent and chronic in nature or could indicate an inflammatory process. Correlate with urinalysis. 3. Distended urinary bladder without wall thickening or intravesical stone. 4. Constipation and diverticulosis. 5. Aortic atherosclerosis. 6. Chronic interstitial changes in the lung bases. No visible progression of these  findings since 02/13/2010, at least in the lung bases. There is no prior chest CT on file. Aortic Atherosclerosis (ICD10-I70.0). Electronically Signed   By: Almira Bar M.D.   On: 09/24/2022 00:34   CT HEAD WO CONTRAST ( )  Result Date: 09/23/2022 CLINICAL DATA:  Fall, Head trauma, minor (Age >= 65y) EXAM: CT HEAD WITHOUT CONTRAST TECHNIQUE: Contiguous axial images were obtained from the base of the skull through the vertex without intravenous contrast. RADIATION DOSE REDUCTION: This exam was performed according to the departmental dose-optimization program which includes automated exposure control, adjustment of the mA and/or kV according to patient size and/or use of iterative reconstruction technique. COMPARISON:  MRI 11/19/2011 FINDINGS: Brain: Normal anatomic configuration. 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 No acute intracranial hemorrhage or  infarct. No abnormal mass effect or midline shift. No abnormal intra or extra-axial mass lesion or fluid collection. Ventricular size is normal. Cerebellum is unremarkable. Vascular: No hyperdense vessel or unexpected calcification. Skull: Normal. Negative for fracture or focal lesion. Sinuses/Orbits: No acute finding. Other: Mastoid air cells and middle ear cavities are clear. There is progressive hypertrophy of the cruciform ligamentous complex posterior to the odontoid with progressive canal narrowing and cord compression at the craniocervical junction. IMPRESSION: 1. No acute intracranial abnormality. 2. 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 3. 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 Electronically Signed   By: Helyn Numbers M.D.   On: 09/23/2022 22:01   DG Chest 2 View  Result Date: 09/23/2022 CLINICAL DATA:  Recurrent falls EXAM: CHEST - 2 VIEW COMPARISON:  Chest radiograph dated 05/04/2022 FINDINGS: Normal lung volumes. No focal consolidations. No pleural effusion or pneumothorax. The heart size and mediastinal contours are within normal limits. No radiographic finding of acute displaced fracture. IMPRESSION: No radiographic finding of acute displaced fracture. No focal consolidations. Electronically Signed   By: Agustin Cree M.D.   On: 09/23/2022 20:38    Pending Labs Unresulted Labs (From admission, onward)     Start     Ordered   10/01/22 0500  Creatinine, serum  (enoxaparin (LOVENOX)    CrCl >/= 30 ml/min)  Weekly,   R     Comments: while on enoxaparin therapy    09/24/22 0900   09/25/22 0500  Basic metabolic panel  Tomorrow morning,   R        09/24/22 0900   09/25/22 0500  CBC  Tomorrow morning,   R        09/24/22 0900   09/25/22 0500  Comprehensive metabolic panel  Tomorrow morning,   R        09/24/22 0900   09/24/22 0856   CBC  (enoxaparin (LOVENOX)    CrCl >/= 30 ml/min)  Once,   R       Comments: Baseline for enoxaparin therapy IF NOT ALREADY DRAWN.  Notify MD if PLT < 100 K.    09/24/22 0900   09/24/22 0856  Creatinine, serum  (enoxaparin (LOVENOX)    CrCl >/= 30 ml/min)  Once,   R       Comments: Baseline for enoxaparin therapy IF NOT ALREADY DRAWN.    09/24/22 0900   09/24/22 0855  Brain natriuretic peptide  Once,   R        09/24/22 0900   09/24/22 0046  CK  Once,   URGENT  09/24/22 0046            Vitals/Pain Today's Vitals   09/24/22 0337 09/24/22 0500 09/24/22 0600 09/24/22 0953  BP: (!) 136/57 (!) 140/58 (!) 113/46 (!) 148/67  Pulse: 61 (!) 59 (!) 59 66  Resp: 16  17 17   Temp: 98 F (36.7 C)   97.7 F (36.5 C)  TempSrc: Oral   Oral  SpO2: 100% 99% 98% 100%  Weight:      Height:      PainSc: 0-No pain       Isolation Precautions No active isolations  Medications Medications  lactated ringers infusion (has no administration in time range)  sodium chloride flush (NS) 0.9 % injection 3 mL (has no administration in time range)  enoxaparin (LOVENOX) injection 30 mg (has no administration in time range)  sodium chloride flush (NS) 0.9 % injection 3 mL (has no administration in time range)  0.9 %  sodium chloride infusion (has no administration in time range)  acetaminophen (TYLENOL) tablet 650 mg (has no administration in time range)    Or  acetaminophen (TYLENOL) suppository 650 mg (has no administration in time range)  polyethylene glycol (MIRALAX / GLYCOLAX) packet 17 g (has no administration in time range)  ondansetron (ZOFRAN) tablet 4 mg (has no administration in time range)    Or  ondansetron (ZOFRAN) injection 4 mg (has no administration in time range)  melatonin tablet 5 mg (has no administration in time range)  lactated ringers bolus 1,500 mL (0 mLs Intravenous Stopped 09/24/22 0013)  meclizine (ANTIVERT) tablet 25 mg (25 mg Oral Given 09/24/22 0010)  gadobutrol  (GADAVIST) 1 MMOL/ML injection 5.5 mL (5.5 mLs Intravenous Contrast Given 09/24/22 0454)    Mobility walks     Focused Assessments Neuro Assessment Handoff:  Swallow screen pass? Yes          Neuro Assessment: Exceptions to WDL (dizzy) Neuro Checks:      Has TPA been given? No If patient is a Neuro Trauma and patient is going to OR before floor call report to 4N Charge nurse: [phone] or [phone]   R Recommendations: See Admitting Provider Note  Report given to:   Additional Notes: Bedside commode with assistance. Continent. States she used to walk with a cane but her daughter threw it away.

## 2022-09-24 NOTE — ED Notes (Signed)
Patient transported to CT 

## 2022-09-24 NOTE — ED Notes (Signed)
Patient ambulated to the bathroom with assistance. Patient continues to be dizzy and unsteady while walking

## 2022-09-24 NOTE — ED Notes (Signed)
Patient ambulated to the bathroom with assistance.

## 2022-09-24 NOTE — ED Provider Notes (Addendum)
Care of patient assumed from Dr. Doran Durand.  This patient presented with complaints of dizziness and lightheadedness.  Lab work is notable for AKI.  Patient endorses recent poor p.o. intake.  She will require admission. Physical Exam  BP (!) 171/69 (BP Location: Right Arm)   Pulse (!) 59   Temp 97.7 F (36.5 C) (Oral)   Resp 18   Ht 5\' 5"  (1.651 m)   Wt 58 kg   SpO2 100%   BMI 21.28 kg/m   Physical Exam Vitals and nursing note reviewed.  Constitutional:      General: She is not in acute distress.    Appearance: Normal appearance. She is well-developed. She is not ill-appearing, toxic-appearing or diaphoretic.  HENT:     Head: Normocephalic and atraumatic.     Right Ear: External ear normal.     Left Ear: External ear normal.     Nose: Nose normal.  Eyes:     Extraocular Movements: Extraocular movements intact.     Conjunctiva/sclera: Conjunctivae normal.  Cardiovascular:     Rate and Rhythm: Normal rate and regular rhythm.  Pulmonary:     Effort: Pulmonary effort is normal. No respiratory distress.  Abdominal:     General: There is no distension.     Palpations: Abdomen is soft.  Musculoskeletal:        General: No swelling. Normal range of motion.     Cervical back: Normal range of motion and neck supple.     Right lower leg: No edema.     Left lower leg: No edema.  Skin:    General: Skin is warm and dry.     Capillary Refill: Capillary refill takes less than 2 seconds.     Coloration: Skin is not jaundiced or pale.  Neurological:     General: No focal deficit present.     Mental Status: She is alert and oriented to person, place, and time.     Cranial Nerves: No cranial nerve deficit.     Sensory: No sensory deficit.     Motor: No weakness.     Coordination: Coordination normal.  Psychiatric:        Mood and Affect: Mood normal.        Behavior: Behavior normal.        Thought Content: Thought content normal.        Judgment: Judgment normal.     Procedures   Procedures  ED Course / MDM    Medical Decision Making Amount and/or Complexity of Data Reviewed Labs: ordered. Radiology: ordered.   On assessment, patient resting on ED stretcher.  On initial CT scan of abdomen pelvis, it does appear that she had a distended bladder.  Patient was able to urinate on her own.  Postvoid residual was only 131.  I do not suspect urine retention.  CTAP did not show any other acute findings.  There is what appears to be chronic perinephric stranding.  Urinalysis is not consistent with UTI.  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  On CT of cervical spine, it shows redemonstration of ligamentous complex posterior to odontoid process.  On exam, she does not have any motor or sensory deficits.  She denies any difficulty walking other than what is caused by her dizziness.  Although she denies any numbness or weakness, patient was seen by PCP 3 weeks ago and endorsed dizziness and numbness in hands and feet at that time.  MRI of cervical spine  was ordered.  Given her AKI, patient was admitted for further management.       Gloris Manchester, MD 09/24/22 1610    Gloris Manchester, MD 09/24/22 [phone]

## 2022-09-24 NOTE — Consult Note (Signed)
Providing Compassionate, Quality Care - Together   Reason for Consult: Cervical stenosis Referring Physician: Bishop Limbo, NP  Lisa [name] is an 72 y.o. female.  HPI: Lisa [name] is a pleasant 72 year old woman with history significant for type II diabetes mellitus, diabetic neuropathy, gastric ulcer, hypertension, hyperlipidemia, and CAD. She presented to the [facility] with complaints of dizziness and lightheadedness. Lisa [name] reports multiple falls over the last two months, with a gradual increase in falls since November of 2023. She reports with her fall today, she did not hit her head or lose consciousness. She did have some shaking of her BUE and BLE. She tells me she has had neck pain for years and chronic numbness in her hands and feet. She does not report any radicular symptoms or significant weakness. 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 Neurosurgery was consulted for further evaluation and recommendations.  Past Medical History:  Diagnosis Date   ARF (acute renal failure) (HCC) 11/18/2018   Coronary artery disease    Diabetes mellitus (HCC) 2007   High cholesterol 2007   Hypertension    Peptic ulcer 1965   Stroke [facility])     Past Surgical History:  Procedure Laterality Date   CESAREAN SECTION     ESOPHAGOGASTRODUODENOSCOPY (EGD) WITH PROPOFOL N/A 05/04/2022   Procedure: ESOPHAGOGASTRODUODENOSCOPY (EGD) WITH PROPOFOL;  Surgeon: Benancio Deeds, MD;  Location: [facility] ENDOSCOPY;  Service: Gastroenterology;  Laterality: N/A;   RIGHT/LEFT HEART CATH AND CORONARY ANGIOGRAPHY N/A 11/19/2018   Procedure: RIGHT/LEFT HEART CATH AND CORONARY ANGIOGRAPHY;  Surgeon: Orpah Cobb, MD;  Location: [facility] INVASIVE CV LAB;  Service: Cardiovascular;  Laterality: N/A;    Family History  Problem Relation Age of Onset   Kidney disease Father    Heart disease Father    Cancer Brother        Throat    Birth defects Son     Social History:   reports that she quit smoking about 9 years ago. Her smoking use included cigarettes. She has a 6.25 pack-year smoking history. She quit smokeless tobacco use about 9 years ago. She reports that she does not currently use alcohol. She reports that she does not use drugs.  Allergies:  Allergies  Allergen Reactions   Ace Inhibitors     Elevated Cr for 0.8 to 1.4 (30 percent or greater rise in serum creatinine)    Aspirin Other (See Comments)    Irritates ulcers   Codeine     "upsets my ulcers"   Hydrocodone     "upsets my ulcers"    Medications: I have reviewed the patient's current medications.  Results for orders placed or performed during the hospital encounter of 09/23/22 (from the past 48 hour(s))  CBC with Differential     Status: Abnormal   Collection Time: 09/23/22  6:57 PM  Result Value Ref Range   WBC 4.1 4.0 - 10.5 K/uL   RBC 3.98 3.87 - 5.11 MIL/uL   Hemoglobin 11.3 (L) 12.0 - 15.0 g/dL   HCT 16.1 (L) 09.6 - 04.5 %   MCV 88.2 80.0 - 100.0 fL   MCH 28.4 26.0 - 34.0 pg   MCHC 32.2 30.0 - 36.0 g/dL   RDW 40.9 (H) 81.1 - 91.4 %   Platelets 208 150 - 400 K/uL   nRBC 0.0 0.0 - 0.2 %   Neutrophils Relative % 58 %   Neutro Abs 2.4 1.7 - 7.7 K/uL   Lymphocytes Relative  29 %   Lymphs Abs 1.2 0.7 - 4.0 K/uL   Monocytes Relative 10 %   Monocytes Absolute 0.4 0.1 - 1.0 K/uL   Eosinophils Relative 2 %   Eosinophils Absolute 0.1 0.0 - 0.5 K/uL   Basophils Relative 1 %   Basophils Absolute 0.0 0.0 - 0.1 K/uL   Immature Granulocytes 0 %   Abs Immature Granulocytes 0.01 0.00 - 0.07 K/uL    Comment: Performed at [facility], [address], [place], [place] [address]  Comprehensive metabolic panel     Status: Abnormal   Collection Time: 09/23/22  6:57 PM  Result Value Ref Range   Sodium 135 135 - 145 mmol/L   Potassium 4.1 3.5 - 5.1 mmol/L   Chloride 104 98 - 111 mmol/L   CO2 22 22 - 32 mmol/L   Glucose, Bld 136 (H) 70 - 99 mg/dL    Comment: Glucose reference range  applies only to samples taken after fasting for at least 8 hours.   BUN 27 (H) 8 - 23 mg/dL   Creatinine, Ser 0.86 (H) 0.44 - 1.00 mg/dL   Calcium 8.9 8.9 - 57.8 mg/dL   Total Protein 7.5 6.5 - 8.1 g/dL   Albumin 4.1 3.5 - 5.0 g/dL   AST 18 15 - 41 U/L   ALT 13 0 - 44 U/L   Alkaline Phosphatase 66 38 - 126 U/L   Total Bilirubin 0.6 0.3 - 1.2 mg/dL   GFR, Estimated 31 (L) >60 mL/min    Comment: (NOTE) Calculated using the CKD-EPI Creatinine Equation (2021)    Anion gap 9 5 - 15    Comment: Performed at [facility], [address], [place], [place] [address]  Troponin I (High Sensitivity)     Status: None   Collection Time: 09/23/22  6:57 PM  Result Value Ref Range   Troponin I (High Sensitivity) 8 <18 ng/L    Comment: (NOTE) Elevated high sensitivity troponin I (hsTnI) values and significant  changes across serial measurements may suggest ACS but many other  chronic and acute conditions are known to elevate hsTnI results.  Refer to the "Links" section for chest pain algorithms and additional  guidance. Performed at [facility], [address] [place], [place] [address]   Troponin I (High Sensitivity)     Status: None   Collection Time: 09/23/22  8:45 PM  Result Value Ref Range   Troponin I (High Sensitivity) 8 <18 ng/L    Comment: (NOTE) Elevated high sensitivity troponin I (hsTnI) values and significant  changes across serial measurements may suggest ACS but many other  chronic and acute conditions are known to elevate hsTnI results.  Refer to the "Links" section for chest pain algorithms and additional  guidance. Performed at [facility], [address] [place], [place] [address]   Urinalysis, Routine w reflex microscopic -Urine, Clean Catch     Status: Abnormal   Collection Time: 09/24/22 12:00 AM  Result Value Ref Range   Color, Urine YELLOW YELLOW   APPearance HAZY (A) CLEAR   Specific Gravity, Urine 1.008 1.005 - 1.030   pH 5.0 5.0 - 8.0    Glucose, UA NEGATIVE NEGATIVE mg/dL   Hgb urine dipstick NEGATIVE NEGATIVE   Bilirubin Urine NEGATIVE NEGATIVE   Ketones, ur NEGATIVE NEGATIVE mg/dL   Protein, ur NEGATIVE NEGATIVE mg/dL   Nitrite NEGATIVE NEGATIVE   Leukocytes,Ua NEGATIVE NEGATIVE    Comment: Performed at [facility], [address], [place], [place] [address]  CK     Status: None   Collection Time: 09/24/22 10:50 AM  Result Value Ref Range   Total CK 178 38 - 234 U/L    Comment: Performed at [facility], [address], [place], [place] [address]  Brain natriuretic peptide     Status: Abnormal   Collection Time: 09/24/22 10:50 AM  Result Value Ref Range   B Natriuretic Peptide 142.0 (H) 0.0 - 100.0 pg/mL    Comment: Performed at [facility], [address], [place], [place] [address]  CBC     Status: Abnormal   Collection Time: 09/24/22 10:50 AM  Result Value Ref Range   WBC 3.8 (L) 4.0 - 10.5 K/uL   RBC 4.05 3.87 - 5.11 MIL/uL   Hemoglobin 11.8 (L) 12.0 - 15.0 g/dL   HCT 91.4 78.2 - 95.6 %   MCV 89.9 80.0 - 100.0 fL   MCH 29.1 26.0 - 34.0 pg   MCHC 32.4 30.0 - 36.0 g/dL   RDW 21.3 (H) 08.6 - 57.8 %   Platelets 200 150 - 400 K/uL   nRBC 0.0 0.0 - 0.2 %    Comment: Performed at [facility], [address] [place], [place] [address]  Creatinine, serum     Status: Abnormal   Collection Time: 09/24/22 10:50 AM  Result Value Ref Range   Creatinine, Ser 1.18 (H) 0.44 - 1.00 mg/dL   GFR, Estimated 49 (L) >60 mL/min    Comment: (NOTE) Calculated using the CKD-EPI Creatinine Equation (2021) Performed at [facility], [address], [place], [place] [address]     MR Cervical Spine W and Wo Contrast  Result Date: 09/24/2022 CLINICAL DATA:  72 year old female status post fall. 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 TIA. EXAM: MRI CERVICAL SPINE WITHOUT AND WITH CONTRAST TECHNIQUE: Multiplanar and multiecho pulse  sequences of the cervical spine, to include the craniocervical junction and cervicothoracic junction, were obtained without and with intravenous contrast. CONTRAST:  5.54mL GADAVIST GADOBUTROL 1 MMOL/ML IV SOLN COMPARISON:  Brain MRI today, and 11/19/2011. FINDINGS: Alignment: Maintained cervical lordosis. No significant scoliosis or spondylolisthesis. Vertebrae: Background bone marrow signal is within normal limits. Heterogeneous abnormal marrow signal in the odontoid process with patchy enhancement, T2 and STIR hyperintensity appears to be degenerative in nature, with surrounding dark T2 and STIR signal bulky soft tissue pannus, up to 7 mm thickness now (versus 6-7 mm also on the 2013 MRI). No enhancement of the pannus. Adjacent skull base marrow signal remains normal. No regional inflammation. 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 No other marrow edema, enhancement, or acute osseous abnormality. Cord: Mass effect on the cervicomedullary junction (series 5, image 8, series 8 image 5), and diffuse mild cervical spinal stenosis elsewhere (also series 5, image 8). But no associated spinal cord edema or expansion. And no convincing myelomalacia at this time. No abnormal dural thickening or intradural enhancement. Posterior Fossa, vertebral arteries, paraspinal tissues: Brain is reported separately today. Preserved major vascular flow voids in the neck. Negative visible neck soft tissues, lung apices. Disc levels: C1-C2: Mild to moderate stenosis at this level in the cervicomedullary junction related to bulky chronic pannus. Mass effect but no lower brainstem or spinal cord signal abnormality identified. C2-C3: Mild multifactorial spinal stenosis and spinal cord  mass effect at this level related to central disc or disc osteophyte complex, moderate facet and ligament flavum hypertrophy. Moderate bilateral  C3 foraminal stenosis C3-C4: Mild to moderate multifactorial spinal stenosis (series 8, image 17) related to broad-based central disc osteophyte complex and up to moderate facet and ligament flavum hypertrophy. Spinal cord mass effect. Mild to moderate bilateral C4 foraminal stenosis. C4-C5: Mild multifactorial spinal stenosis here related to broad-based central disc osteophyte complex and mild to moderate facet and ligament flavum hypertrophy. Mild to moderate bilateral C5 foraminal stenosis. C5-C6: Mild spinal stenosis related to broad-based central disc osteophyte complex and mild to moderate facet and ligament flavum hypertrophy. Moderate to severe left and moderate right C6 foraminal stenosis. C6-C7: Right paracentral disc or disc osteophyte complex (series 8, image 30) and up to moderate facet and ligament flavum hypertrophy. Mild spinal stenosis here, mild if any cord mass effect. Mild to moderate left but no convincing right C7 foraminal stenosis. C7-T1: Mild foraminal endplate spurring. Mild to moderate facet and ligament flavum hypertrophy. No significant spinal stenosis. Upper thoracic spinal stenosis visible at T2-T3 appears related to mild spondylolisthesis with minor disc bulging but moderate to severe facet and ligament flavum hypertrophy. Mild spinal cord mass effect there, no definite cord signal abnormality. Moderate to severe left T2 foraminal stenosis. IMPRESSION: 1. Bulky chronic pannus at the cervicomedullary junction with heterogeneous degenerative appearing marrow signal changes in the underlying odontoid process. Query rheumatoid arthritis or other inflammatory arthropathy. Pannus thickness up to 7 mm, but minimally progressed compared to a 2013 brain MRI. Subsequent mild to moderate stenosis and mass effect on the cervicomedullary junction, but no lower brainstem signal changes are associated. 2. Similar diffuse degenerative cervical spinal stenosis C2-C3 through C5-C6 ranging from mild to  moderate with multilevel mass effect on the spinal cord. But no cervical cord edema or myelomalacia identified. 3. Associated moderate or severe neural foraminal stenosis at the bilateral C3 and C5 nerve levels. Electronically Signed   By: Odessa Fleming M.D.   On: 09/24/2022 05:49   MR Brain W and Wo Contrast  Result Date: 09/24/2022 CLINICAL DATA:  72 year old female status post fall. 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 TIA. EXAM: MRI HEAD WITHOUT AND WITH CONTRAST TECHNIQUE: Multiplanar, multiecho pulse sequences of the brain and surrounding structures were obtained without and with intravenous contrast. CONTRAST:  5.55mL GADAVIST GADOBUTROL 1 MMOL/ML IV SOLN COMPARISON:  Head CT yesterday. Brain MRI 11/19/2011. Cervical spine MRI today reported separately. FINDINGS: Brain: Dark T2, intermediate T1 signal soft tissue posterior to the odontoid at the ventral cervicomedullary junction is chronic but progressed since 2013. No enhancement following contrast (series 22, image 7). This appears degenerative, and see additional details on cervical spine MRI reported separately. Cerebral volume has not significantly changed since 2013 and is normal for age. No restricted diffusion to suggest acute infarction. No midline shift, mass effect, evidence of mass lesion, ventriculomegaly, extra-axial collection or acute intracranial hemorrhage. Negative pituitary. Chronic encephalomalacia in the right brainstem related to patchy infarct there which was acute on the 2013 MRI (series 15, image 11 today). But elsewhere gray and white matter signal remains within normal limits for age throughout the brain. No cortical encephalomalacia or chronic cerebral blood products identified. No abnormal enhancement identified.  No dural thickening. Vascular: Major intracranial vascular flow voids are stable since 2013. The right ICA appears somewhat non dominant. Skull and  upper cervical spine: Cervicomedullary junction stenosis, see cervical  spine MRI reported separately. Degenerative sclerosis at the odontoid but otherwise visible bone marrow signal remains normal. Sinuses/Orbits: Negative. Paranasal Visualized paranasal sinuses and mastoids are stable and well aerated. Other: Visible internal auditory structures appear normal. Negative visible scalp and face. IMPRESSION: 1. No acute intracranial abnormality. 2. Bulky chronic, progressed pannus at the dorsal odontoid with cervicomedullary junction stenosis. See Cervical Spine MRI reported separately. 3. Chronic right brainstem infarct. But elsewhere MRI appearance of the brain remains normal for age. Electronically Signed   By: Odessa Fleming M.D.   On: 09/24/2022 05:37   CT Cervical Spine Wo Contrast  Result Date: 09/24/2022 CLINICAL DATA:  Neck pain, cervical spondylosis EXAM: CT CERVICAL SPINE WITHOUT CONTRAST TECHNIQUE: Multidetector CT imaging of the cervical spine was performed without intravenous contrast. Multiplanar CT image reconstructions were also generated. RADIATION DOSE REDUCTION: This exam was performed according to the departmental dose-optimization program which includes automated exposure control, adjustment of the mA and/or kV according to patient size and/or use of iterative reconstruction technique. COMPARISON:  None Available. FINDINGS: Alignment: Normal. Skull base and vertebrae: Craniocervical alignment is normal. The atlantodental interval is not widened. No acute fracture of the cervical spine. Vertebral body height is preserved. Soft tissues and spinal canal: Hypertrophy of the ligamentous complex posterior to the odontoid process results in marked narrowing the spinal canal at the craniocervical junction with flattening of the spinal cord, best seen on axial image # 18/9. 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 Superimposed posterior disc osteophyte complex at C5-6 and central disc bulges at  C3-4 and to a lesser extent C2-3 and C4-5 result in moderate to severe central canal stenosis with flattening of the thecal sac and an AP diameter of 5-6 mm. No canal hematoma. No prevertebral soft tissue swelling or paraspinal fluid collections. Disc levels: There is endplate remodeling throughout the cervical spine in keeping with changes of advanced degenerative disc disease. Vacuum disc phenomena noted at C3-C7 prevertebral soft tissues are not thickened on sagittal reformats. Mild to moderate left neuroforaminal narrowing at C5-6. No other significant neuroforaminal narrowing identified. Upper chest: Negative. Other: None IMPRESSION: 1. No acute fracture or listhesis of the cervical spine. 2. Hypertrophy of the ligamentous complex posterior to the odontoid process results in marked narrowing the spinal canal at the craniocervical junction with flattening of the spinal cord. 3. 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 4. Mild to moderate left neuroforaminal narrowing at C5-6. Electronically Signed   By: Helyn Numbers M.D.   On: 09/24/2022 01:12   CT Renal Stone Study  Result Date: 09/24/2022 CLINICAL DATA:  Abdominal and flank pain.  Stone suspected. EXAM: CT ABDOMEN AND PELVIS WITHOUT CONTRAST TECHNIQUE: Multidetector CT imaging of the abdomen and pelvis was performed following the standard protocol without IV contrast. RADIATION DOSE REDUCTION: This exam was performed according to the departmental dose-optimization program which includes automated exposure control, adjustment of the mA and/or kV according to patient size and/or use of iterative reconstruction technique. COMPARISON:  CT with IV contrast 02/13/2010 FINDINGS: Lower chest: Lung bases again demonstrate mild nonspecific chronic interstitial thickening consistent with chronic interstitial disease. There has been no apparent  progression. No focal pneumonia is seen.  There is mild bronchial thickening. The heart is slightly enlarged. There is no substantial pericardial fluid. Hepatobiliary: The unenhanced liver is homogeneous. Gallbladder and bile ducts are unremarkable. Pancreas: Unremarkable without contrast. Spleen: Unremarkable without contrast.  No splenomegaly. Adrenals/Urinary Tract: There is no adrenal mass. No focal abnormality in the unenhanced renal cortex. There is no evidence of urinary stones or obstruction. The bladder is distended with the dome reaching the L4-5 level. No wall thickening or intravesical stone is seen. There is mild bilateral increased perinephric stranding. This could be senescent and chronic in nature or could indicate an inflammatory process. Please correlate with urinalysis. Stomach/Bowel: No dilatation or wall thickening, including the appendix. Moderate fecal stasis. No mesenteric inflammatory changes. Uncomplicated sigmoid diverticulosis. Vascular/Lymphatic: Aortic atherosclerosis. No enlarged abdominal or pelvic lymph nodes. Reproductive: There is a 2 cm calcified fibroid of the left uterine fundus. Similar sized partially calcified subserosal fibroid of the dorsal lower uterine segment. No adnexal mass is seen. Other: No abdominal wall hernia or abnormality. No abdominopelvic ascites. Musculoskeletal: Degenerative changes and mild dextroscoliosis lumbar spine. Mild-to-moderate hip DJD. There is bridging enthesopathy in the lower thoracic spine. Severe acquired spinal stenosis L3-4 and L4-5 is noted with multilevel degenerative lumbar foraminal compromise. Moderate hip DJD changes. No acute osseous abnormality is seen.  No aggressive lesions. IMPRESSION: 1. No evidence of urinary stones or obstruction. 2. Increased perinephric stranding which could be senescent and chronic in nature or could indicate an inflammatory process. Correlate with urinalysis. 3. Distended urinary bladder without wall  thickening or intravesical stone. 4. Constipation and diverticulosis. 5. Aortic atherosclerosis. 6. Chronic interstitial changes in the lung bases. No visible progression of these findings since 02/13/2010, at least in the lung bases. There is no prior chest CT on file. Aortic Atherosclerosis (ICD10-I70.0). Electronically Signed   By: Almira Bar M.D.   On: 09/24/2022 00:34   CT HEAD WO CONTRAST ( )  Result Date: 09/23/2022 CLINICAL DATA:  Fall, Head trauma, minor (Age >= 65y) EXAM: CT HEAD WITHOUT CONTRAST TECHNIQUE: Contiguous axial images were obtained from the base of the skull through the vertex without intravenous contrast. RADIATION DOSE REDUCTION: This exam was performed according to the departmental dose-optimization program which includes automated exposure control, adjustment of the mA and/or kV according to patient size and/or use of iterative reconstruction technique. COMPARISON:  MRI 11/19/2011 FINDINGS: Brain: Normal anatomic configuration. 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 No acute intracranial hemorrhage or infarct. No abnormal mass effect or midline shift. No abnormal intra or extra-axial mass lesion or fluid collection. Ventricular size is normal. Cerebellum is unremarkable. Vascular: No hyperdense vessel or unexpected calcification. Skull: Normal. Negative for fracture or focal lesion. Sinuses/Orbits: No acute finding. Other: Mastoid air cells and middle ear cavities are clear. There is progressive hypertrophy of the cruciform ligamentous complex posterior to the odontoid with progressive canal narrowing and cord compression at the craniocervical junction. IMPRESSION: 1. No acute intracranial abnormality. 2. 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 3. 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  Electronically Signed   By: Helyn Numbers M.D.   On: 09/23/2022 22:01   DG Chest 2 View  Result Date: 09/23/2022 CLINICAL DATA:  Recurrent falls EXAM: CHEST - 2 VIEW COMPARISON:  Chest radiograph dated 05/04/2022 FINDINGS: Normal lung volumes. No focal consolidations. No pleural effusion or pneumothorax. The heart size and mediastinal contours are within normal limits. No radiographic finding of acute displaced fracture. IMPRESSION: No radiographic finding of acute displaced fracture. No focal consolidations. Electronically Signed   By: Agustin Cree M.D.   On: 09/23/2022 20:38    Review of Systems  Constitutional: Negative.   HENT: Negative.    Eyes: Negative.   Respiratory: Negative.    Cardiovascular: Negative.   Gastrointestinal:  Positive for nausea.  Genitourinary: Negative.   Musculoskeletal:  Positive for falls and neck pain.  Skin: Negative.   Neurological:  Positive for dizziness, tingling and sensory change. Negative for focal weakness, loss of consciousness and weakness.  Psychiatric/Behavioral: Negative.     Blood pressure (!) 141/69, pulse 65, temperature (!) 97.3 F (36.3 C), resp. rate 15, height 5\' 5"  (1.651 m), weight 58 kg, SpO2 100 %. Estimated body mass index is 21.28 kg/m as calculated from the following:   Height as of this encounter: 5\' 5"  (1.651 m).   Weight as of this encounter: 58 kg.  Physical Exam Constitutional:      Appearance: Normal appearance.  HENT:     Head: Normocephalic and atraumatic.     Nose: Nose normal.     Mouth/Throat:     Mouth: Mucous membranes are moist.     Pharynx: Oropharynx is clear.  Eyes:     Extraocular Movements: Extraocular movements intact.     Pupils: Pupils are equal, round, and reactive to light.  Cardiovascular:     Rate and Rhythm: Normal rate.     Pulses: Normal pulses.  Pulmonary:     Effort: Pulmonary effort is normal. No respiratory distress.  Abdominal:     General: Abdomen is flat.     Palpations: Abdomen  is soft.  Musculoskeletal:        General: Normal range of motion.     Cervical back: Normal range of motion.  Skin:    General: Skin is warm and dry.     Capillary Refill: Capillary refill takes less than 2 seconds.  Neurological:     General: No focal deficit present.     Mental Status: She is alert and oriented to person, place, and time.     Cranial Nerves: Cranial nerves 2-12 are intact.     Motor: No weakness.  Psychiatric:        Mood and Affect: Mood normal.        Behavior: Behavior normal.     Assessment/Plan: Lisa [name] has multilevel degenerative changes on her cervical MRI. There is chronic narrowing at the cervicomedullary junction that is stable compared to imaging from 10 years ago. There is diffuse degenerative cervical spinal stenosis, but no myelomalacia or cord edema seen. Her physical exam does not present any concerning findings. The c-spine findings are chronic in nature and do not require urgent surgical intervention. Lisa [name] can follow up with Dr. Lovell Sheehan as an outpatient to further discuss her situation. Please reconsult if Neurosurgery can be of further assistance.    Val Eagle, DNP, AGNP-C Nurse Practitioner  [facility] [address], [address], [place], [place] [address] P: [phone]    F: [phone]  09/24/2022, 1:42 PM

## 2022-09-24 NOTE — H&P (Signed)
History and Physical    Patient: Lisa [name] ZOX:[id number] DOB: [birth year] DOA: 09/23/2022 DOS: the patient was seen and examined on 09/24/2022 PCP: Gerre Scull, NP  Patient coming from: Home  Chief Complaint:  Chief Complaint  Patient presents with   Dizziness   HPI: Lisa [name] is a 72 y.o. 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 She presented to [facility] on 09/23/22 with chief complaint of dizziness and lightheadedness. She has recently had poor PO intake secondary to nausea. She reports she has had frequent falls over the last 2-3 months with it not being abnormal for her to fall up to twice a day. She reports a progressive increase in frequency of falls since November 2023. She presented to the ED today because her arms and legs were shaking after the falls, which is a change for her. She denies loss of consciousness with falls. Denies injury or head strike. She is endorsing neck pain on exam today that has been ongoing for years. Denies chest pain, palpitations. Denies melena, hematochezia, or hematemesis.  Review of Systems: As mentioned in the history of present illness. All other systems reviewed and are negative. Past Medical History:  Diagnosis Date   ARF (acute renal failure) (HCC) 11/18/2018   Coronary artery disease    Diabetes mellitus (HCC) 2007   High cholesterol 2007   Hypertension    Peptic ulcer 1965   Stroke [facility])    Past Surgical History:  Procedure Laterality Date   CESAREAN SECTION     ESOPHAGOGASTRODUODENOSCOPY (EGD) WITH PROPOFOL N/A 05/04/2022   Procedure: ESOPHAGOGASTRODUODENOSCOPY (EGD) WITH PROPOFOL;  Surgeon: Benancio Deeds, MD;  Location: [facility] ENDOSCOPY;  Service: Gastroenterology;  Laterality: N/A;   RIGHT/LEFT HEART CATH AND CORONARY ANGIOGRAPHY N/A 11/19/2018   Procedure: RIGHT/LEFT HEART CATH AND CORONARY  ANGIOGRAPHY;  Surgeon: Orpah Cobb, MD;  Location: [facility] INVASIVE CV LAB;  Service: Cardiovascular;  Laterality: N/A;   Social History:  reports that she quit smoking about 9 years ago. Her smoking use included cigarettes. She has a 6.25 pack-year smoking history. She quit smokeless tobacco use about 9 years ago. She reports that she does not currently use alcohol. She reports that she does not use drugs.  Allergies  Allergen Reactions   Ace Inhibitors     Elevated Cr for 0.8 to 1.4 (30 percent or greater rise in serum creatinine)    Aspirin Other (See Comments)    Irritates ulcers   Codeine     "upsets my ulcers"   Hydrocodone     "upsets my ulcers"    Family History  Problem Relation Age of Onset   Kidney disease Father    Heart disease Father    Cancer Brother        Throat    Birth defects Son     Prior to Admission medications   Medication Sig Start Date End Date Taking? Authorizing Provider  acetaminophen (TYLENOL) 500 MG tablet Take 1,000 mg by mouth every 6 (six) hours as needed for moderate pain or mild pain.   Yes [provider]  atorvastatin (LIPITOR) 40 MG tablet Take 40 mg by mouth daily.   Yes [provider]  carvedilol (COREG) 12.5 MG tablet Take 0.5 tablets (6.25 mg total) by mouth 2 (two) times daily with a meal. Patient taking differently: Take 12.5 mg by mouth 2 (two) times daily with a meal. 05/06/22  Yes Ghimire, Werner Lean, MD  cholecalciferol (VITAMIN D3) 25 MCG (1000 UNIT) tablet Take 2 tablets (2,000 Units total) by mouth daily. Patient taking differently: Take 1,000 Units by mouth in the morning and at bedtime. 09/02/22  Yes [name], [name], NP  citalopram (CELEXA) 20 MG tablet TAKE 1 TABLET EVERY DAY 09/02/22  Yes [name], [name], NP  clopidogrel (PLAVIX) 75 MG tablet Take 1 tablet (75 mg total) by mouth daily. 05/07/22  Yes Ghimire, Werner Lean, MD  diclofenac Sodium (VOLTAREN) 1 % GEL Apply 2 g topically 4 (four) times daily. Patient  taking differently: Apply 2 g topically 4 (four) times daily as needed (pain). 07/25/22  Yes [name], [name], NP  melatonin 3 MG TABS tablet TAKE 1 TABLET AT BEDTIME 03/17/22  Yes Ganta, Anupa, DO  metFORMIN (GLUCOPHAGE) 500 MG tablet Take 250 mg by mouth 2 (two) times daily with a meal.   Yes [provider]  Multiple Vitamin (MULTIVITAMIN) tablet Take 1 tablet by mouth daily. 09/02/22  Yes [name], [name], NP  ondansetron (ZOFRAN) 4 MG tablet TAKE 1 TABLET EVERY 8 HOURS AS NEEDED FOR NAUSEA OR VOMITING Patient taking differently: Take 4 mg by mouth every 8 (eight) hours as needed for nausea or vomiting. 06/03/22  Yes [name], [name], NP  pantoprazole (PROTONIX) 40 MG tablet Take 1 tablet (40 mg total) by mouth 2 (two) times daily. 06/19/22  Yes [name], [name], NP  pregabalin (LYRICA) 75 MG capsule Take 1 capsule (75 mg total) by mouth daily. 09/02/22  Yes [name], [name], NP  sacubitril-valsartan (ENTRESTO) 49-51 MG Take 1 tablet by mouth 2 (two) times daily.   Yes [provider]  triamterene-hydrochlorothiazide (MAXZIDE-25) 37.5-25 MG tablet Take 1 tablet by mouth daily. 09/02/22  Yes [name], [name], NP  Accu-Chek Softclix Lancets lancets Use as instructed 05/19/22   [name], [name], NP  Alcohol Swabs (DROPSAFE ALCOHOL PREP) 70 % PADS Apply topically. 05/29/22   [provider]  Blood Glucose Monitoring Suppl (ACCU-CHEK AVIVA PLUS) w/Device KIT 1 each by Does not apply route daily. 05/09/22   [name], [name], NP  glucose blood (ACCU-CHEK AVIVA PLUS) test strip Use as instructed 05/19/22   [name], [name], NP  glucose blood (ONETOUCH ULTRA) test strip Use as instructed 04/17/22   Gerre Scull, NP    Physical Exam: Vitals:   09/23/22 2321 09/24/22 0337 09/24/22 0500 09/24/22 0600  BP: (!) 171/69 (!) 136/57 (!) 140/58 (!) 113/46  Pulse: (!) 59 61 (!) 59 (!) 59  Resp: 18 16  17   Temp: 97.7 F (36.5 C) 98 F (36.7 C)    TempSrc: Oral Oral    SpO2: 100%  100% 99% 98%  Weight:      Height:       Constitutional: NAD, calm, cooperative Eyes: PERRL, lids and conjunctivae normal ENMT: Mucous membranes are moist. Posterior pharynx clear of any exudate or lesions.  Neck: normal, supple, no masses, no thyromegaly Respiratory: clear to auscultation bilaterally, no wheezing, no crackles. Normal respiratory effort. No accessory muscle use.  Cardiovascular: Regular rate and rhythm, no murmurs / rubs / gallops. No extremity edema. 2+ pedal pulses. No carotid bruits.  Abdomen: no tenderness, no masses palpated. No hepatosplenomegaly. Bowel sounds positive.  Musculoskeletal: no clubbing / cyanosis. No joint deformity upper and lower extremities. Good ROM, no contractures. Normal muscle tone.  Skin: no rashes, lesions, ulcers. No induration Neurologic: CN 2-12 grossly intact. Sensation intact, DTR normal. Strength 4/5  all extremities. Psychiatric: Normal judgment and insight. Alert and oriented x 3. Normal mood.   Data Reviewed:  Results are pending, will review when available. CBC    Component Value Date/Time   WBC 4.1 09/23/2022 1857   RBC 3.98 09/23/2022 1857   HGB 11.3 (L) 09/23/2022 1857   HGB 12.3 07/12/2020 1054   HCT 35.1 (L) 09/23/2022 1857   HCT 35.0 07/12/2020 1054   PLT 208 09/23/2022 1857   PLT 214 07/12/2020 1054   MCV 88.2 09/23/2022 1857   MCV 89 07/12/2020 1054   MCH 28.4 09/23/2022 1857   MCHC 32.2 09/23/2022 1857   RDW 15.9 (H) 09/23/2022 1857   RDW 12.0 07/12/2020 1054   LYMPHSABS 1.2 09/23/2022 1857   LYMPHSABS 1.1 07/12/2020 1054   MONOABS 0.4 09/23/2022 1857   EOSABS 0.1 09/23/2022 1857   EOSABS 0.2 07/12/2020 1054   BASOSABS 0.0 09/23/2022 1857   BASOSABS 0.1 07/12/2020 1054   CMP     Component Value Date/Time   NA 135 09/23/2022 1857   NA 139 06/20/2020 1103   K 4.1 09/23/2022 1857   CL 104 09/23/2022 1857   CO2 22 09/23/2022 1857   GLUCOSE 136 (H) 09/23/2022 1857   BUN 27 (H) 09/23/2022 1857   BUN 15  06/20/2020 1103   CREATININE 1.72 (H) 09/23/2022 1857   CREATININE 0.80 01/20/2013 0936   CALCIUM 8.9 09/23/2022 1857   PROT 7.5 09/23/2022 1857   ALBUMIN 4.1 09/23/2022 1857   AST 18 09/23/2022 1857   ALT 13 09/23/2022 1857   ALKPHOS 66 09/23/2022 1857   BILITOT 0.6 09/23/2022 1857   GFR 64.69 04/17/2022 1110   EGFR 64 06/20/2020 1103   GFRNONAA 31 (L) 09/23/2022 1857   MR Cervical Spine W and Wo Contrast  Result Date: 09/24/2022 CLINICAL DATA:  72 year old female status post fall. 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 TIA. EXAM: MRI CERVICAL SPINE WITHOUT AND WITH CONTRAST TECHNIQUE: Multiplanar and multiecho pulse sequences of the cervical spine, to include the craniocervical junction and cervicothoracic junction, were obtained without and with intravenous contrast. CONTRAST:  5.20mL GADAVIST GADOBUTROL 1 MMOL/ML IV SOLN COMPARISON:  Brain MRI today, and 11/19/2011. FINDINGS: Alignment: Maintained cervical lordosis. No significant scoliosis or spondylolisthesis. Vertebrae: Background bone marrow signal is within normal limits. Heterogeneous abnormal marrow signal in the odontoid process with patchy enhancement, T2 and STIR hyperintensity appears to be degenerative in nature, with surrounding dark T2 and STIR signal bulky soft tissue pannus, up to 7 mm thickness now (versus 6-7 mm also on the 2013 MRI). No enhancement of the pannus. Adjacent skull base marrow signal remains normal. No regional inflammation. 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 No other marrow edema, enhancement, or acute osseous abnormality. Cord: Mass effect on the cervicomedullary junction (series 5, image 8, series 8 image 5), and diffuse mild cervical spinal stenosis elsewhere (also series 5, image 8). But no associated spinal cord edema or  expansion. And no convincing myelomalacia at this time. No abnormal dural thickening or intradural enhancement. Posterior Fossa, vertebral arteries, paraspinal tissues: Brain is reported separately today. Preserved major vascular flow voids in the neck. Negative visible neck soft tissues, lung apices. Disc levels: C1-C2: Mild to moderate stenosis at this level in the cervicomedullary junction related to bulky chronic pannus. Mass effect but no lower brainstem or spinal cord signal abnormality identified. C2-C3: Mild  multifactorial spinal stenosis and spinal cord mass effect at this level related to central disc or disc osteophyte complex, moderate facet and ligament flavum hypertrophy. Moderate bilateral C3 foraminal stenosis C3-C4: Mild to moderate multifactorial spinal stenosis (series 8, image 17) related to broad-based central disc osteophyte complex and up to moderate facet and ligament flavum hypertrophy. Spinal cord mass effect. Mild to moderate bilateral C4 foraminal stenosis. C4-C5: Mild multifactorial spinal stenosis here related to broad-based central disc osteophyte complex and mild to moderate facet and ligament flavum hypertrophy. Mild to moderate bilateral C5 foraminal stenosis. C5-C6: Mild spinal stenosis related to broad-based central disc osteophyte complex and mild to moderate facet and ligament flavum hypertrophy. Moderate to severe left and moderate right C6 foraminal stenosis. C6-C7: Right paracentral disc or disc osteophyte complex (series 8, image 30) and up to moderate facet and ligament flavum hypertrophy. Mild spinal stenosis here, mild if any cord mass effect. Mild to moderate left but no convincing right C7 foraminal stenosis. C7-T1: Mild foraminal endplate spurring. Mild to moderate facet and ligament flavum hypertrophy. No significant spinal stenosis. Upper thoracic spinal stenosis visible at T2-T3 appears related to mild spondylolisthesis with minor disc bulging but moderate to severe  facet and ligament flavum hypertrophy. Mild spinal cord mass effect there, no definite cord signal abnormality. Moderate to severe left T2 foraminal stenosis. IMPRESSION: 1. Bulky chronic pannus at the cervicomedullary junction with heterogeneous degenerative appearing marrow signal changes in the underlying odontoid process. Query rheumatoid arthritis or other inflammatory arthropathy. Pannus thickness up to 7 mm, but minimally progressed compared to a 2013 brain MRI. Subsequent mild to moderate stenosis and mass effect on the cervicomedullary junction, but no lower brainstem signal changes are associated. 2. Similar diffuse degenerative cervical spinal stenosis C2-C3 through C5-C6 ranging from mild to moderate with multilevel mass effect on the spinal cord. But no cervical cord edema or myelomalacia identified. 3. Associated moderate or severe neural foraminal stenosis at the bilateral C3 and C5 nerve levels. Electronically Signed   By: Odessa Fleming M.D.   On: 09/24/2022 05:49   MR Brain W and Wo Contrast  Result Date: 09/24/2022 CLINICAL DATA:  72 year old female status post fall. 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 TIA. EXAM: MRI HEAD WITHOUT AND WITH CONTRAST TECHNIQUE: Multiplanar, multiecho pulse sequences of the brain and surrounding structures were obtained without and with intravenous contrast. CONTRAST:  5.35mL GADAVIST GADOBUTROL 1 MMOL/ML IV SOLN COMPARISON:  Head CT yesterday. Brain MRI 11/19/2011. Cervical spine MRI today reported separately. FINDINGS: Brain: Dark T2, intermediate T1 signal soft tissue posterior to the odontoid at the ventral cervicomedullary junction is chronic but progressed since 2013. No enhancement following contrast (series 22, image 7). This appears degenerative, and see additional details on cervical spine MRI reported separately. Cerebral volume has not significantly changed since 2013 and is normal for age.  No restricted diffusion to suggest acute infarction. No midline shift, mass effect, evidence of mass lesion, ventriculomegaly, extra-axial collection or acute intracranial hemorrhage. Negative pituitary. Chronic encephalomalacia in the right brainstem related to patchy infarct there which was acute on the 2013 MRI (series 15, image 11 today). But elsewhere gray and white matter signal remains within normal limits for age throughout the brain. No cortical encephalomalacia or chronic cerebral blood products identified. No abnormal enhancement identified.  No dural thickening. Vascular: Major intracranial vascular flow voids are stable since 2013. The right ICA appears somewhat non dominant. Skull and upper cervical spine:  Cervicomedullary junction stenosis, see cervical spine MRI reported separately. Degenerative sclerosis at the odontoid but otherwise visible bone marrow signal remains normal. Sinuses/Orbits: Negative. Paranasal Visualized paranasal sinuses and mastoids are stable and well aerated. Other: Visible internal auditory structures appear normal. Negative visible scalp and face. IMPRESSION: 1. No acute intracranial abnormality. 2. Bulky chronic, progressed pannus at the dorsal odontoid with cervicomedullary junction stenosis. See Cervical Spine MRI reported separately. 3. Chronic right brainstem infarct. But elsewhere MRI appearance of the brain remains normal for age. Electronically Signed   By: Odessa Fleming M.D.   On: 09/24/2022 05:37   CT Cervical Spine Wo Contrast  Result Date: 09/24/2022 CLINICAL DATA:  Neck pain, cervical spondylosis EXAM: CT CERVICAL SPINE WITHOUT CONTRAST TECHNIQUE: Multidetector CT imaging of the cervical spine was performed without intravenous contrast. Multiplanar CT image reconstructions were also generated. RADIATION DOSE REDUCTION: This exam was performed according to the departmental dose-optimization program which includes automated exposure control, adjustment of the mA  and/or kV according to patient size and/or use of iterative reconstruction technique. COMPARISON:  None Available. FINDINGS: Alignment: Normal. Skull base and vertebrae: Craniocervical alignment is normal. The atlantodental interval is not widened. No acute fracture of the cervical spine. Vertebral body height is preserved. Soft tissues and spinal canal: Hypertrophy of the ligamentous complex posterior to the odontoid process results in marked narrowing the spinal canal at the craniocervical junction with flattening of the spinal cord, best seen on axial image # 18/9. 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 Superimposed posterior disc osteophyte complex at C5-6 and central disc bulges at C3-4 and to a lesser extent C2-3 and C4-5 result in moderate to severe central canal stenosis with flattening of the thecal sac and an AP diameter of 5-6 mm. No canal hematoma. No prevertebral soft tissue swelling or paraspinal fluid collections. Disc levels: There is endplate remodeling throughout the cervical spine in keeping with changes of advanced degenerative disc disease. Vacuum disc phenomena noted at C3-C7 prevertebral soft tissues are not thickened on sagittal reformats. Mild to moderate left neuroforaminal narrowing at C5-6. No other significant neuroforaminal narrowing identified. Upper chest: Negative. Other: None IMPRESSION: 1. No acute fracture or listhesis of the cervical spine. 2. Hypertrophy of the ligamentous complex posterior to the odontoid process results in marked narrowing the spinal canal at the craniocervical junction with flattening of the spinal cord. 3. 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 4. Mild to moderate left neuroforaminal narrowing at C5-6. Electronically Signed   By: Helyn Numbers M.D.   On: 09/24/2022 01:12   CT Renal Stone Study  Result Date:  09/24/2022 CLINICAL DATA:  Abdominal and flank pain.  Stone suspected. EXAM: CT ABDOMEN AND PELVIS WITHOUT CONTRAST TECHNIQUE: Multidetector CT imaging of the abdomen and pelvis was performed following the standard protocol without IV contrast. RADIATION DOSE REDUCTION: This exam was performed according to the departmental dose-optimization program which includes automated exposure control, adjustment of the mA and/or kV according to patient size and/or use of iterative reconstruction technique. COMPARISON:  CT with IV contrast 02/13/2010 FINDINGS: Lower chest: Lung bases again demonstrate mild nonspecific chronic interstitial thickening consistent with chronic interstitial disease. There has been no apparent progression. No focal pneumonia is seen.  There is mild bronchial thickening. The heart is slightly enlarged. There is no substantial pericardial fluid. Hepatobiliary: The unenhanced liver is homogeneous. Gallbladder and bile ducts are unremarkable. Pancreas: Unremarkable without contrast.  Spleen: Unremarkable without contrast.  No splenomegaly. Adrenals/Urinary Tract: There is no adrenal mass. No focal abnormality in the unenhanced renal cortex. There is no evidence of urinary stones or obstruction. The bladder is distended with the dome reaching the L4-5 level. No wall thickening or intravesical stone is seen. There is mild bilateral increased perinephric stranding. This could be senescent and chronic in nature or could indicate an inflammatory process. Please correlate with urinalysis. Stomach/Bowel: No dilatation or wall thickening, including the appendix. Moderate fecal stasis. No mesenteric inflammatory changes. Uncomplicated sigmoid diverticulosis. Vascular/Lymphatic: Aortic atherosclerosis. No enlarged abdominal or pelvic lymph nodes. Reproductive: There is a 2 cm calcified fibroid of the left uterine fundus. Similar sized partially calcified subserosal fibroid of the dorsal lower uterine segment. No  adnexal mass is seen. Other: No abdominal wall hernia or abnormality. No abdominopelvic ascites. Musculoskeletal: Degenerative changes and mild dextroscoliosis lumbar spine. Mild-to-moderate hip DJD. There is bridging enthesopathy in the lower thoracic spine. Severe acquired spinal stenosis L3-4 and L4-5 is noted with multilevel degenerative lumbar foraminal compromise. Moderate hip DJD changes. No acute osseous abnormality is seen.  No aggressive lesions. IMPRESSION: 1. No evidence of urinary stones or obstruction. 2. Increased perinephric stranding which could be senescent and chronic in nature or could indicate an inflammatory process. Correlate with urinalysis. 3. Distended urinary bladder without wall thickening or intravesical stone. 4. Constipation and diverticulosis. 5. Aortic atherosclerosis. 6. Chronic interstitial changes in the lung bases. No visible progression of these findings since 02/13/2010, at least in the lung bases. There is no prior chest CT on file. Aortic Atherosclerosis (ICD10-I70.0). Electronically Signed   By: Almira Bar M.D.   On: 09/24/2022 00:34   CT HEAD WO CONTRAST ( )  Result Date: 09/23/2022 CLINICAL DATA:  Fall, Head trauma, minor (Age >= 65y) EXAM: CT HEAD WITHOUT CONTRAST TECHNIQUE: Contiguous axial images were obtained from the base of the skull through the vertex without intravenous contrast. RADIATION DOSE REDUCTION: This exam was performed according to the departmental dose-optimization program which includes automated exposure control, adjustment of the mA and/or kV according to patient size and/or use of iterative reconstruction technique. COMPARISON:  MRI 11/19/2011 FINDINGS: Brain: Normal anatomic configuration. 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 No acute intracranial hemorrhage or infarct. No abnormal mass effect or midline shift. No abnormal intra or extra-axial mass lesion or fluid collection. Ventricular size is normal.  Cerebellum is unremarkable. Vascular: No hyperdense vessel or unexpected calcification. Skull: Normal. Negative for fracture or focal lesion. Sinuses/Orbits: No acute finding. Other: Mastoid air cells and middle ear cavities are clear. There is progressive hypertrophy of the cruciform ligamentous complex posterior to the odontoid with progressive canal narrowing and cord compression at the craniocervical junction. IMPRESSION: 1. No acute intracranial abnormality. 2. 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 3. 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 Electronically Signed   By: Helyn Numbers M.D.   On: 09/23/2022 22:01   DG Chest 2 View  Result Date: 09/23/2022 CLINICAL DATA:  Recurrent falls EXAM: CHEST - 2 VIEW COMPARISON:  Chest radiograph dated 05/04/2022 FINDINGS: Normal lung volumes. No focal consolidations. No pleural effusion or pneumothorax. The heart size and mediastinal contours are within normal limits. No radiographic finding of acute displaced fracture. IMPRESSION: No radiographic finding of acute displaced fracture. No focal consolidations. Electronically Signed   By: Agustin Cree M.D.   On: 09/23/2022 20:38  Assessment and Plan: #Acute Kidney Injury In setting of poor PO intake - gentle IVF hydration 50 mL/hr for 10 hours, monitor respiratory status closely - Hold home Maxzide and Entresto  - Avoid nephrotoxic medications  #Dizziness, Falls Patient reports approximately 2 falls per day over the past 2 months.She presented to the ED today because of shaking in lower arms and legs which is abnormal for her. She denies sensation that room is spinning, denies loss of consciousness. Endorses numbness in fingers. She otherwise denies numbness or tingling in lower extremities today but presented to PCP ~3 weeks ago with reports of  dizziness and numbness of hands/feet. - Repeat ECHO - Monitor on telemetry for at least 24 hrs - Check orthostatic BP - Check CK - CBG monitoring - Falls Precautions - Seizures unlikely but could consider neurology consultation pending clinical course  # C-spine Stenosis with Mass Effect - Analgesia PRN - Neurosurgery consulted, appreciate their assistance and recommendations.  #HFrEF Last ECHO completed in 11/2018. LVEF 25-30%. Euvolemic on exam. BNP 142 - Repeat ECHO as above - Hold home Maxzide and Entresto in setting of AKI  #Hypertension Stable - Continue home Coreg   #Hyperlipidemia Stable - Hold statin in setting of falls  VTE prophylaxis: Lovenox GI prophylaxis: Protonix Diet: Heart Healthy Access: PIV Lines: None Telemetry: Yes Disposition: Admit to Telemetry [facility] Planning: Code Status: FULL Code  Consults: Neurosurgery  Family Communication: Patient is alert and oriented and can independently update family as desired. I did ask if she would like me to call family or friends and she declined.  Severity of Illness: The appropriate patient status for this patient is INPATIENT. Inpatient status is judged to be reasonable and necessary in order to provide the required intensity of service to ensure the patient's safety. The patient's presenting symptoms, physical exam findings, and initial radiographic and laboratory data in the context of their chronic comorbidities is felt to place them at high risk for further clinical deterioration. Furthermore, it is not anticipated that the patient will be medically stable for discharge from the hospital within 2 midnights of admission.   * I certify that at the point of admission it is my clinical judgment that the patient will require inpatient hospital care spanning beyond 2 midnights from the point of admission due to high intensity of service, high risk for further deterioration and high frequency of  surveillance required.*  To reach the provider On-Call:   7AM- 7PM see care teams to locate the attending and reach out to them via [email or web address] Password: [email or web address] 7PM-7AM contact night-coverage If you still have difficulty reaching the appropriate provider, please page the [facility] (Director on Call) for [facility] on amion for assistance  This document was prepared using Conservation officer, historic buildings and may include unintentional dictation errors.  Bishop Limbo DNP, MBA, FNP-BC, PMHNP-BC Nurse Practitioner [facility]

## 2022-09-24 NOTE — Progress Notes (Signed)
Transition of Care [facility]) - Inpatient Brief Assessment   Patient Details  Name: Liliyana Thobe MRN: [id number] Date of Birth: 25-Sep-1950  Transition of Care [facility]) CM/SW Contact:    Janae Bridgeman, RN Phone Number: 09/24/2022, 2:53 PM   Clinical Narrative: CM will continue to follow the patient for TOC needs.  The patient admitted to the hospital for AKI, poor po intake and c-spine stenosis that was evaluated by neurosurgery for Outpatient follow up.  The patient is in procedure at this time but will be evaluated by PT/OT for [facility] versus SNF placement.  CM will continue to follow the patient for TOC needs.   Transition of Care Asessment: Insurance and Status: Insurance coverage has been reviewed Patient has primary care physician: Yes Home environment has been reviewed: Yes Prior level of function:: Independent Prior/Current Home Services: (P) No current home services Social Determinants of Health Reivew: (P) SDOH reviewed needs interventions (pending PT evaluation - will evaluate the patient post evaluation to determine if patient can be provided with [facility] services if needed to avoid rehospitalization - considering patient's frequent falls at home) Readmission risk has been reviewed: (P) Yes Transition of care needs: (P) transition of care needs identified, TOC will continue to follow

## 2022-09-24 NOTE — ED Notes (Signed)
Bladder Scan volume 

## 2022-09-24 NOTE — Telephone Encounter (Signed)
Did not count as no show, no fee.

## 2022-09-24 NOTE — Care Management CC44 (Cosign Needed)
Condition Code 44 Documentation Completed  Patient Details  Name: Jordayn Mink MRN: [id number] Date of Birth: 07-11-1950   Condition Code 44 given:  Yes Patient signature on Condition Code 44 notice:  Yes Documentation of 2 MD's agreement:  Yes Code 44 added to claim:  Yes    Janae Bridgeman, RN 09/24/2022, 4:29 PM

## 2022-09-24 NOTE — Telephone Encounter (Signed)
Noted, she went to ER and is currently being admitted.

## 2022-09-24 NOTE — Care Management Obs Status (Cosign Needed)
MEDICARE OBSERVATION STATUS NOTIFICATION   Patient Details  Name: Lisa [name] MRN: [id number] Date of Birth: [birth year]   Medicare Observation Status Notification Given:  Yes    Janae Bridgeman, RN 09/24/2022, 4:28 PM

## 2022-09-24 NOTE — ED Provider Notes (Incomplete)
  [place] EMERGENCY DEPARTMENT AT [facility] Provider Note   CSN: [id number] Arrival date & time: 09/23/22  1849     History Chief Complaint  Patient presents with  . Dizziness    HPI Lisa [name] is a 72 y.o. female presenting for chief complaint of dizziness and lightheadedness.  She is a 72 year old female with an extensive medical history history.  Poor p.o. intake secondary to nausea. States that she has had multiple symptoms over the past week and had 2 episodes of syncope today.  There is a history of gastric ulcer that caused similar presentation in the past history of heart failure, history of diabetes.  Patient's recorded medical, surgical, social, medication list and allergies were reviewed in the Snapshot window as part of the initial history.   Review of Systems   Review of Systems  Physical Exam Updated Vital Signs BP 101/61 (BP Location: Right Arm)   Pulse 73   Temp 98.3 F (36.8 C) (Oral)   Resp 15   Ht 5\' 5"  (1.651 m)   Wt 58 kg   SpO2 100%   BMI 21.28 kg/m  Physical Exam   ED Course/ Medical Decision Making/ A&P    Procedures Procedures   Medications Ordered in ED Medications - No data to display  Medical Decision Making:    Lisa [name] is a 72 y.o. female who presented to the ED today with *** detailed above.     {crccomplexity:27900}  Complete initial physical exam performed, notably the patient  was ***.      Reviewed and confirmed nursing documentation for past medical history, family history, social history.    Initial Assessment:   With the patient's presentation of ***, most likely diagnosis is ***. Other diagnoses were considered including (but not limited to) ***. These are considered less likely due to history of present illness and physical exam findings.   {crccopa:27899}  Initial Plan:  ***  ***Screening labs including CBC and Metabolic panel to evaluate for infectious or metabolic etiology of  disease.  ***Urinalysis with reflex culture ordered to evaluate for UTI or relevant urologic/nephrologic pathology.  ***CXR to evaluate for structural/infectious intrathoracic pathology.  ***EKG to evaluate for cardiac pathology. Objective evaluation as below reviewed with plan for close reassessment  Initial Study Results:   Laboratory  All laboratory results reviewed without evidence of clinically relevant pathology.   ***Exceptions include: ***   ***EKG EKG was reviewed independently. Rate, rhythm, axis, intervals all examined and without medically relevant abnormality. ST segments without concerns for elevations.    Radiology  All images reviewed independently. ***Agree with radiology report at this time.   No results found.   Consults:  Case discussed with ***.   Final Assessment and Plan:   ***   ***  Clinical Impression: No diagnosis found.   Data Unavailable   Final Clinical Impression(s) / ED Diagnoses Final diagnoses:  None    Rx / DC Orders ED Discharge Orders     None

## 2022-09-25 DIAGNOSIS — R42 Dizziness and giddiness: Secondary | ICD-10-CM | POA: Diagnosis not present

## 2022-09-25 DIAGNOSIS — N179 Acute kidney failure, unspecified: Secondary | ICD-10-CM | POA: Diagnosis not present

## 2022-09-25 DIAGNOSIS — M47812 Spondylosis without myelopathy or radiculopathy, cervical region: Secondary | ICD-10-CM | POA: Diagnosis present

## 2022-09-25 LAB — GLUCOSE, CAPILLARY
Glucose-Capillary: 120 mg/dL — ABNORMAL HIGH (ref 70–99)
Glucose-Capillary: 118 mg/dL — ABNORMAL HIGH (ref 70–99)

## 2022-09-25 MED ORDER — VITAMIN D 25 MCG (1000 UNIT) PO TABS: 1000.0000 [IU] | ORAL_TABLET | Freq: Two times a day (BID) | ORAL | Status: AC

## 2022-09-25 MED ORDER — ENSURE ENLIVE PO LIQD: 237.0000 mL | Freq: Three times a day (TID) | ORAL | Status: DC

## 2022-09-25 MED ORDER — CARVEDILOL 12.5 MG PO TABS: 12.5000 mg | ORAL_TABLET | Freq: Two times a day (BID) | ORAL | Status: AC

## 2022-09-25 MED ORDER — DICLOFENAC SODIUM 1 % EX GEL: 2.0000 g | Freq: Four times a day (QID) | CUTANEOUS | Status: DC | PRN

## 2022-09-25 NOTE — Progress Notes (Signed)
   09/25/22 1100  Spiritual Encounters  Type of Visit Initial  Care provided to: Patient  Referral source Patient request  Reason for visit Routine spiritual support  OnCall Visit No   Ch responded to request for emotional and spiritual support. There was no family at bedside.  Miss Lisa [name] was concerned that she would not have transportation once she is discharged. Ch provided hospitality and facilitated communication between pt and the care team. No follow-up needed at this time.

## 2022-09-25 NOTE — TOC Progression Note (Addendum)
Transition of Care [facility]) - Progression Note    Patient Details  Name: Lisa [name] MRN: [id number] Date of Birth: 12/14/1950  Transition [facility]) CM/SW Contact  Janae Bridgeman, RN Phone Number: 09/25/2022, 9:31 AM  Clinical Narrative:    Patient verbalized yesterday that she has trouble obtaining groceries and transportation to appointments.  She states that she is having trouble making ends meet financially and has difficulty getting food from food shelters.  I spoke with [name] and obtained PT, MSW, RN in the home to assist with medication compliance, strengthening, and social issues to obtain meals on wheels and food stamps.  Home health orders placed to be co-signed by the MD.  Frances Furbish accepted the patient for services.  PT eval is pending evaluation.  I called the [facility] and Wheels department and left an urgent message to have patient to receive services as soon as possible.  09/25/2022 1103 - CM called and spoke with Crystal, CM at [employer] and patient does not qualify for Meals on Wheels since she is able to cook for herself at home.  I called [employer] and asked that patient be provided with post-hospitalization meals for 7 days - 2 x per day and asked for assistance with transportation to appointments and the grocery store.  [employer] will follow up with the patient tomorrow by phone to coordinate.  Patient was offered Medicare choice regarding dme company for [employer] and patient did not have a preference.  I called [employer] to have a cane delivered to the patient's hospital room today.  The patient will need a cab voucher home when she is ready for discharge since the patient does not have transportation home.   Expected Discharge Plan: Home w Home Health Services Barriers to Discharge: Continued Medical Work up  Expected Discharge Plan and Services   Discharge Planning Services: CM Consult   Living arrangements for the past  2 months: Apartment                                       Social Determinants of Health (SDOH) Interventions SDOH Screenings   Food Insecurity: Food Insecurity Present (09/24/2022)  Housing: Low Risk  (09/24/2022)  Transportation Needs: Unmet Transportation Needs (09/24/2022)  Utilities: Not At Risk (09/24/2022)  Alcohol Screen: Low Risk  (04/24/2022)  Depression (PHQ2-9): Low Risk  (09/02/2022)  Financial Resource Strain: Low Risk  (06/09/2022)  Recent Concern: Financial Resource Strain - Medium Risk (04/24/2022)  Physical Activity: Inactive (06/09/2022)  Social Connections: Moderately Isolated (04/24/2022)  Stress: No Stress Concern Present (06/09/2022)  Tobacco Use: Medium Risk (09/23/2022)    Readmission Risk Interventions    09/24/2022    4:44 PM  Readmission Risk Prevention Plan  Transportation Screening Complete  PCP or Specialist Appt within 5-7 Days Complete  Home Care Screening Complete  Medication Review (RN CM) Complete

## 2022-09-25 NOTE — Evaluation (Signed)
Physical Therapy Evaluation Patient Details Name: Lisa [name] MRN: [id number] DOB: 16-Nov-1950 Today's Date: 09/25/2022  History of Present Illness  72 yo female presents to [facility]11 with dizziness x6 months, x3 falls on 6/11. multilevel degenerative changes on her cervical MRI. PMH includes frequent  falls over past 2-3 months, DM with neuropathy, gastric ulcer, HTN, HLD, CAD, HF.  Clinical Impression   Pt presents with baseline level of balance, strength, mobility. Pt mobilizing at a mod I to supervision level, pt expressing when she falls at home it feels like "electricity that lasts 5-10 seconds", but reports it is not consistent and does not typically happen with certain movements or mobility. Pt with no reports of dizziness throughout session, even with challenge during dynamic balance assessment, pt does express she would feel more comfortable going home with cane in case of dizzy spell. PT initiated gait training with cane, requires little to no cuing for proper use. Pt scored 20/24 on DGI balance assessment, which does not indicate increased risk of falls from a mobility standpoint. PT to sign off, pt declines follow up PT services at this time.      Recommendations for follow up therapy are one component of a multi-disciplinary discharge planning process, led by the attending physician.  Recommendations may be updated based on patient status, additional functional criteria and insurance authorization.  Follow Up Recommendations       Assistance Recommended at Discharge PRN  Patient can return home with the following       Equipment Recommendations Gilmer Mor (single point)  Recommendations for Other Services       Functional Status Assessment Patient has not had a recent decline in their functional status     Precautions / Restrictions Precautions Precautions: Fall Restrictions Weight Bearing Restrictions: No      Mobility  Bed Mobility Overal bed mobility: Modified  Independent                  Transfers Overall transfer level: Modified independent Equipment used: None                    Ambulation/Gait Ambulation/Gait assistance: Supervision Gait Distance (Feet): 400 Feet Assistive device: None, Straight cane Gait Pattern/deviations: Step-through pattern Gait velocity: decr     General Gait Details: can ambulate without AD, intermittent use of cane for pt comfort and self-steadying  Stairs            Wheelchair Mobility    Modified Rankin (Stroke Patients Only)       Balance Overall balance assessment: History of Falls, Needs assistance Sitting-balance support: No upper extremity supported, Feet supported Sitting balance-Leahy Scale: Good     Standing balance support: During functional activity, No upper extremity supported Standing balance-Leahy Scale: Good                   Standardized Balance Assessment Standardized Balance Assessment : Dynamic Gait Index   Dynamic Gait Index Level Surface: Normal Change in Gait Speed: Normal Gait with Horizontal Head Turns: Mild Impairment Gait with Vertical Head Turns: Mild Impairment Gait and Pivot Turn: Normal Step Over Obstacle: Mild Impairment Step Around Obstacles: Normal Steps: Mild Impairment Total Score: 20       Pertinent Vitals/Pain Pain Assessment Pain Assessment: Faces Faces Pain Scale: No hurt Pain Intervention(s): Monitored during session    Home Living Family/patient expects to be discharged to:: Private residence Living Arrangements: Alone Available Help at Discharge: Friend(s);Available PRN/intermittently (RN friend) Type  of Home: Apartment Home Access: Level entry       Home Layout: One level Home Equipment: None      Prior Function Prior Level of Function : Independent/Modified Independent             Mobility Comments: pt doesn't drive, her friend who is a Char[employer] fundraiser takes her to store, appointments       Hand  Dominance   Dominant Hand: Right    Extremity/Trunk Assessment   Upper Extremity Assessment Upper Extremity Assessment: Defer to OT evaluation    Lower Extremity Assessment Lower Extremity Assessment: Generalized weakness    Cervical / Trunk Assessment Cervical / Trunk Assessment: Normal  Communication   Communication: No difficulties  Cognition Arousal/Alertness: Awake/alert Behavior During Therapy: WFL for tasks assessed/performed Overall Cognitive Status: Within Functional Limits for tasks assessed                                          General Comments      Exercises     Assessment/Plan    PT Assessment Patient does not need any further PT services  PT Problem List         PT Treatment Interventions      PT Goals (Current goals can be found in the Care Plan section)  Acute Rehab PT Goals Patient Stated Goal: home PT Goal Formulation: All assessment and education complete, DC therapy Time For Goal Achievement: 09/25/22 Potential to Achieve Goals: Good    Frequency       Co-evaluation               AM-PAC PT "6 Clicks" Mobility  Outcome Measure Help needed turning from your back to your side while in a flat bed without using bedrails?: None Help needed moving from lying on your back to sitting on the side of a flat bed without using bedrails?: None Help needed moving to and from a bed to a chair (including a wheelchair)?: None Help needed standing up from a chair using your arms (e.g., wheelchair or bedside chair)?: None Help needed to walk in hospital room?: None Help needed climbing 3-5 steps with a railing? : A Little 6 Click Score: 23    End of Session Equipment Utilized During Treatment: Gait belt Activity Tolerance: Patient tolerated treatment well Patient left: in chair;with call bell/phone within reach;with chair alarm set Nurse Communication: Mobility status PT Visit Diagnosis: Other abnormalities of gait and  mobility (R26.89)    Time: 6962-9528 PT Time Calculation (min) (ACUTE ONLY): 27 min   Charges:   PT Evaluation $PT Eval Low Complexity: 1 Low PT Treatments $Therapeutic Activity: 8-22 mins        Marye Round, PT DPT Acute Rehabilitation Services Secure Chat Preferred  Office [phone]   Nishita Isaacks E Stroup 09/25/2022, 10:07 AM

## 2022-09-25 NOTE — Discharge Summary (Addendum)
Physician Discharge Summary   Patient: Lisa [name] MRN: [id number] DOB: [birth year]  Admit date:     09/23/2022  Discharge date: 09/25/22  Discharge Physician: Lurene Shadow   PCP: Gerre Scull, NP   Recommendations at discharge:   Follow-up with PCP in 1 week Follow-up with Dr. Lovell Sheehan, neurosurgeon, in 2 weeks  Discharge Diagnoses: Principal Problem:   Acute kidney injury [facility]) Active Problems:   Chronic combined systolic and diastolic heart failure (HCC)   History of stroke   Numbness and tingling in both hands   Dizziness   AKI (acute kidney injury) (HCC)   Degenerative joint disease of cervical spine  Resolved Problems:   * No resolved hospital problems. [facility] Course:  Lisa [name] is a 72 y.o. 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   She presented to the hospital because of dizziness, multiple falls, chronic neck pain numbness in bilateral hands.   She was found to have acute kidney injury and chronic multilevel degenerative changes of cervical spine with cervical stenosis.  She was treated with IV fluids and AKI resolved.  She was seen by neurosurgeon who recommended outpatient follow-up.  Repeat 2D echo showed that her EF had recovered.  Previously her EF was 25 to 30% (August 2020) but 2D echo on this admission showed normal EF. She was evaluated by PT.  He was able to ambulate independently.  No PT follow-up was required by PT.  A cane was recommended.  Her condition is improved and she is deemed stable for discharge to home.        Consultants: Neurosurgeon Procedures performed: None Disposition: Home Diet recommendation:  Discharge Diet Orders (From admission, onward)     Start     Ordered   09/25/22 0000  Diet - low sodium heart healthy        09/25/22 1342           Cardiac diet DISCHARGE  MEDICATION: Allergies as of 09/25/2022       Reactions   Ace Inhibitors    Elevated Cr for 0.8 to 1.4 (30 percent or greater rise in serum creatinine)    Aspirin Other (See Comments)   Irritates ulcers   Codeine    "upsets my ulcers"   Hydrocodone    "upsets my ulcers"        Medication List     TAKE these medications    Accu-Chek Aviva Plus w/Device Kit 1 each by Does not apply route daily.   Accu-Chek Softclix Lancets lancets Use as instructed   acetaminophen 500 MG tablet Commonly known as: TYLENOL Take 1,000 mg by mouth every 6 (six) hours as needed for moderate pain or mild pain.   atorvastatin 40 MG tablet Commonly known as: LIPITOR Take 40 mg by mouth daily.   carvedilol 12.5 MG tablet Commonly known as: COREG Take 1 tablet (12.5 mg total) by mouth 2 (two) times daily with a meal.   cholecalciferol 25 MCG (1000 UNIT) tablet Commonly known as: VITAMIN D3 Take 1 tablet (1,000 Units total) by mouth in the morning and at bedtime.   citalopram 20 MG tablet Commonly known as: CELEXA TAKE 1 TABLET EVERY DAY   clopidogrel 75 MG tablet Commonly known as: PLAVIX Take 1 tablet (75 mg total) by mouth daily.   diclofenac Sodium 1 % Gel Commonly known as: Voltaren Apply 2 g topically 4 (  four) times daily as needed (pain).   DropSafe Alcohol Prep 70 % Pads Apply topically.   Entresto 49-51 MG Generic drug: sacubitril-valsartan Take 1 tablet by mouth 2 (two) times daily.   melatonin 3 MG Tabs tablet TAKE 1 TABLET AT BEDTIME   metFORMIN 500 MG tablet Commonly known as: GLUCOPHAGE Take 250 mg by mouth 2 (two) times daily with a meal.   multivitamin tablet Take 1 tablet by mouth daily.   ondansetron 4 MG tablet Commonly known as: ZOFRAN TAKE 1 TABLET EVERY 8 HOURS AS NEEDED FOR NAUSEA OR VOMITING What changed: See the new instructions.   OneTouch Ultra test strip Generic drug: glucose blood Use as instructed   Accu-Chek Aviva Plus test  strip Generic drug: glucose blood Use as instructed   pantoprazole 40 MG tablet Commonly known as: Protonix Take 1 tablet (40 mg total) by mouth 2 (two) times daily.   pregabalin 75 MG capsule Commonly known as: Lyrica Take 1 capsule (75 mg total) by mouth daily.   triamterene-hydrochlorothiazide 37.5-25 MG tablet Commonly known as: MAXZIDE-25 Take 1 tablet by mouth daily.        Follow-up Information     [facility] Follow up.   Specialty: Home Health Services Why: Home health will be providing RN, PT and medical social work.  They will call you to set up services in the next 24-48 hours. Contact information: [address] [place] [address] [phone]         Gerre Scull, NP. Schedule an appointment as soon as possible for a visit.   Specialty: Internal Medicine Why: Please call the office and schedule a hospital follow up in the next 7-10 days. Contact information: [address] [place] [address] [phone]         [facility] Follow up.   Why: [employer] will be providing a Cane to your hospital room before you are discharged home. Contact information: [address] [address] [phone]         [employer] Follow up.   Why: Case management with [employer] will be contacting you to set up transportation services to appointments and grocery store.  They will also contact you to set up meals  2x per day for the next 7 days post-hospitalization. Contact information: [phone]        Tressie Stalker, MD. Schedule an appointment as soon as possible for a visit in 2 week(s).   Specialty: Neurosurgery Contact information: [address] [place] [address] [phone]                Discharge Exam: Ceasar Mons Weights   09/23/22 1852  Weight: 58 kg   GEN: NAD SKIN: Warm and dry EYES: No pallor or icterus ENT: MMM CV: RRR PULM:  CTA B ABD: soft, ND, NT, +BS CNS: AAO x 3, non focal EXT: No edema or tenderness   Condition at discharge: good  The results of significant diagnostics from this hospitalization (including imaging, microbiology, ancillary and laboratory) are listed below for reference.   Imaging Studies: ECHOCARDIOGRAM COMPLETE  Result Date: 09/24/2022    ECHOCARDIOGRAM REPORT   Patient Name:   CAYLA SPRANDEL [facility] Date of Exam: 09/24/2022 Medical Rec #:  [id number]         Height:       65.0 in Accession #:    [id number]        Weight:  127.9 lb Date of Birth:  [birth year]         BSA:          1.636 m Patient Age:    72 years          BP:           148/67 mmHg Patient Gender: F                 HR:           65 bpm. Exam Location:  Inpatient Procedure: 2D Echo, Cardiac Doppler, Color Doppler and 3D Echo Indications:    R55 Syncope  History:        Patient has prior history of Echocardiogram examinations, most                 recent 11/18/2018. CHF, Stroke,                 Signs/Symptoms:Dizziness/Lightheadedness; Risk                 Factors:Hypertension, Dyslipidemia, Diabetes and Former Smoker.  Sonographer:    Sheralyn Boatman RDCS Referring Phys: [id number] KATY L FOUST  Sonographer Comments: Suboptimal parasternal window. IMPRESSIONS  1. Left ventricular ejection fraction, by estimation, is 50 to 55%. The left ventricle has low normal function. The left ventricle has no regional wall motion abnormalities. There is moderate asymmetric left ventricular hypertrophy of the infero-lateral  and inferior segments. Left ventricular diastolic parameters are consistent with Grade I diastolic dysfunction (impaired relaxation).  2. Right ventricular systolic function is normal. The right ventricular size is normal.  3. There is no evidence of cardiac tamponade.  4. The mitral valve is abnormal. Moderate mitral valve regurgitation. Eccentric jet with horizontal display artifact, severity of jet may be underestimated.  5. The aortic valve  was not well visualized. Aortic valve regurgitation is not visualized. No aortic stenosis is present. Comparison(s): Prior images reviewed side by side. Eccentric mitral valve jet is similar. FINDINGS  Left Ventricle: Left ventricular ejection fraction, by estimation, is 50 to 55%. The left ventricle has low normal function. The left ventricle has no regional wall motion abnormalities. The left ventricular internal cavity size was normal in size. There is moderate asymmetric left ventricular hypertrophy of the infero-lateral and inferior segments. Left ventricular diastolic parameters are consistent with Grade I diastolic dysfunction (impaired relaxation). Right Ventricle: The right ventricular size is normal. No increase in right ventricular wall thickness. Right ventricular systolic function is normal. Left Atrium: Left atrial size was normal in size. Right Atrium: Right atrial size was normal in size. Pericardium: Trivial pericardial effusion is present. The pericardial effusion is circumferential. There is no evidence of cardiac tamponade. Mitral Valve: The mitral valve is abnormal. Moderate mitral valve regurgitation. Tricuspid Valve: The tricuspid valve is normal in structure. Tricuspid valve regurgitation is not demonstrated. No evidence of tricuspid stenosis. Aortic Valve: The aortic valve was not well visualized. Aortic valve regurgitation is not visualized. No aortic stenosis is present. Pulmonic Valve: The pulmonic valve was not well visualized. Pulmonic valve regurgitation is not visualized. No evidence of pulmonic stenosis. Aorta: The aortic root and ascending aorta are structurally normal, with no evidence of dilitation. IAS/Shunts: No atrial level shunt detected by color flow Doppler.  LEFT VENTRICLE PLAX 2D LVIDd:         3.65 cm     Diastology LVIDs:         2.80 cm     LV e' medial:  6.31 cm/s LV PW:         1.55 cm     LV E/e' medial:  10.5 LV IVS:        0.95 cm     LV e' lateral:   7.62 cm/s  LVOT diam:     2.20 cm     LV E/e' lateral: 8.7 LV SV:         84 LV SV Index:   51 LVOT Area:     3.80 cm                             3D Volume EF: LV Volumes (MOD)           3D EF:        51 % LV vol d, MOD A2C: 70.8 ml LV EDV:       78 ml LV vol d, MOD A4C: 60.4 ml LV ESV:       39 ml LV vol s, MOD A2C: 39.5 ml LV SV:        39 ml LV vol s, MOD A4C: 29.9 ml LV SV MOD A2C:     31.3 ml LV SV MOD A4C:     60.4 ml LV SV MOD BP:      31.0 ml RIGHT VENTRICLE            IVC RV S prime:     8.70 cm/s  IVC diam: 1.10 cm TAPSE (M-mode): 1.3 cm LEFT ATRIUM             Index        RIGHT ATRIUM          Index LA diam:        3.00 cm 1.83 cm/m   RA Area:     9.51 cm LA Vol (A2C):   32.9 ml 20.11 ml/m  RA Volume:   15.40 ml 9.42 ml/m LA Vol (A4C):   27.3 ml 16.69 ml/m LA Biplane Vol: 31.0 ml 18.95 ml/m  AORTIC VALVE LVOT Vmax:   99.60 cm/s LVOT Vmean:  63.900 cm/s LVOT VTI:    0.220 m  AORTA Ao Root diam: 2.65 cm Ao Asc diam:  2.70 cm MITRAL VALVE MV Area (PHT): 3.42 cm       SHUNTS MV Decel Time: 222 msec       Systemic VTI:  0.22 m MR Peak grad:    142.1 mmHg   Systemic Diam: 2.20 cm MR Mean grad:    83.0 mmHg MR Vmax:         596.00 cm/s MR Vmean:        428.0 cm/s MR PISA:         0.77 cm MR PISA Eff ROA: 5 mm MR PISA Radius:  0.35 cm MV E velocity: 66.05 cm/s MV A velocity: 95.60 cm/s MV E/A ratio:  0.69 Riley Lam MD Electronically signed by Riley Lam MD Signature Date/Time: 09/24/2022/4:36:57 PM    Final    MR Cervical Spine W and Wo Contrast  Result Date: 09/24/2022 CLINICAL DATA:  72 year old female status post fall. 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 TIA. EXAM: MRI CERVICAL SPINE WITHOUT AND WITH CONTRAST TECHNIQUE: Multiplanar and multiecho pulse sequences of the cervical spine, to include the craniocervical junction and cervicothoracic junction, were obtained without and with intravenous contrast. CONTRAST:   5.55mL GADAVIST GADOBUTROL 1 MMOL/ML IV  SOLN COMPARISON:  Brain MRI today, and 11/19/2011. FINDINGS: Alignment: Maintained cervical lordosis. No significant scoliosis or spondylolisthesis. Vertebrae: Background bone marrow signal is within normal limits. Heterogeneous abnormal marrow signal in the odontoid process with patchy enhancement, T2 and STIR hyperintensity appears to be degenerative in nature, with surrounding dark T2 and STIR signal bulky soft tissue pannus, up to 7 mm thickness now (versus 6-7 mm also on the 2013 MRI). No enhancement of the pannus. Adjacent skull base marrow signal remains normal. No regional inflammation. 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 No other marrow edema, enhancement, or acute osseous abnormality. Cord: Mass effect on the cervicomedullary junction (series 5, image 8, series 8 image 5), and diffuse mild cervical spinal stenosis elsewhere (also series 5, image 8). But no associated spinal cord edema or expansion. And no convincing myelomalacia at this time. No abnormal dural thickening or intradural enhancement. Posterior Fossa, vertebral arteries, paraspinal tissues: Brain is reported separately today. Preserved major vascular flow voids in the neck. Negative visible neck soft tissues, lung apices. Disc levels: C1-C2: Mild to moderate stenosis at this level in the cervicomedullary junction related to bulky chronic pannus. Mass effect but no lower brainstem or spinal cord signal abnormality identified. C2-C3: Mild multifactorial spinal stenosis and spinal cord mass effect at this level related to central disc or disc osteophyte complex, moderate facet and ligament flavum hypertrophy. Moderate bilateral C3 foraminal stenosis C3-C4: Mild to moderate multifactorial spinal stenosis (series 8, image 17) related to broad-based central disc osteophyte complex and up to  moderate facet and ligament flavum hypertrophy. Spinal cord mass effect. Mild to moderate bilateral C4 foraminal stenosis. C4-C5: Mild multifactorial spinal stenosis here related to broad-based central disc osteophyte complex and mild to moderate facet and ligament flavum hypertrophy. Mild to moderate bilateral C5 foraminal stenosis. C5-C6: Mild spinal stenosis related to broad-based central disc osteophyte complex and mild to moderate facet and ligament flavum hypertrophy. Moderate to severe left and moderate right C6 foraminal stenosis. C6-C7: Right paracentral disc or disc osteophyte complex (series 8, image 30) and up to moderate facet and ligament flavum hypertrophy. Mild spinal stenosis here, mild if any cord mass effect. Mild to moderate left but no convincing right C7 foraminal stenosis. C7-T1: Mild foraminal endplate spurring. Mild to moderate facet and ligament flavum hypertrophy. No significant spinal stenosis. Upper thoracic spinal stenosis visible at T2-T3 appears related to mild spondylolisthesis with minor disc bulging but moderate to severe facet and ligament flavum hypertrophy. Mild spinal cord mass effect there, no definite cord signal abnormality. Moderate to severe left T2 foraminal stenosis. IMPRESSION: 1. Bulky chronic pannus at the cervicomedullary junction with heterogeneous degenerative appearing marrow signal changes in the underlying odontoid process. Query rheumatoid arthritis or other inflammatory arthropathy. Pannus thickness up to 7 mm, but minimally progressed compared to a 2013 brain MRI. Subsequent mild to moderate stenosis and mass effect on the cervicomedullary junction, but no lower brainstem signal changes are associated. 2. Similar diffuse degenerative cervical spinal stenosis C2-C3 through C5-C6 ranging from mild to moderate with multilevel mass effect on the spinal cord. But no cervical cord edema or myelomalacia identified. 3. Associated moderate or severe neural foraminal  stenosis at the bilateral C3 and C5 nerve levels. Electronically Signed   By: Odessa Fleming M.D.   On: 09/24/2022 05:49   MR Brain W and Wo Contrast  Result Date: 09/24/2022 CLINICAL DATA:  72 year old female status post fall. CT  evidence of cervicomedullary junction stenosis on head and cervical spine CT earlier this presentation. TIA. EXAM: MRI HEAD WITHOUT AND WITH CONTRAST TECHNIQUE: Multiplanar, multiecho pulse sequences of the brain and surrounding structures were obtained without and with intravenous contrast. CONTRAST:  5.45mL GADAVIST GADOBUTROL 1 MMOL/ML IV SOLN COMPARISON:  Head CT yesterday. Brain MRI 11/19/2011. Cervical spine MRI today reported separately. FINDINGS: Brain: Dark T2, intermediate T1 signal soft tissue posterior to the odontoid at the ventral cervicomedullary junction is chronic but progressed since 2013. No enhancement following contrast (series 22, image 7). This appears degenerative, and see additional details on cervical spine MRI reported separately. Cerebral volume has not significantly changed since 2013 and is normal for age. No restricted diffusion to suggest acute infarction. No midline shift, mass effect, evidence of mass lesion, ventriculomegaly, extra-axial collection or acute intracranial hemorrhage. Negative pituitary. Chronic encephalomalacia in the right brainstem related to patchy infarct there which was acute on the 2013 MRI (series 15, image 11 today). But elsewhere gray and white matter signal remains within normal limits for age throughout the brain. No cortical encephalomalacia or chronic cerebral blood products identified. No abnormal enhancement identified.  No dural thickening. Vascular: Major intracranial vascular flow voids are stable since 2013. The right ICA appears somewhat non dominant. Skull and upper cervical spine: Cervicomedullary junction stenosis, see cervical spine MRI reported separately. Degenerative sclerosis at the odontoid but otherwise visible  bone marrow signal remains normal. Sinuses/Orbits: Negative. Paranasal Visualized paranasal sinuses and mastoids are stable and well aerated. Other: Visible internal auditory structures appear normal. Negative visible scalp and face. IMPRESSION: 1. No acute intracranial abnormality. 2. Bulky chronic, progressed pannus at the dorsal odontoid with cervicomedullary junction stenosis. See Cervical Spine MRI reported separately. 3. Chronic right brainstem infarct. But elsewhere MRI appearance of the brain remains normal for age. Electronically Signed   By: Odessa Fleming M.D.   On: 09/24/2022 05:37   CT Cervical Spine Wo Contrast  Result Date: 09/24/2022 CLINICAL DATA:  Neck pain, cervical spondylosis EXAM: CT CERVICAL SPINE WITHOUT CONTRAST TECHNIQUE: Multidetector CT imaging of the cervical spine was performed without intravenous contrast. Multiplanar CT image reconstructions were also generated. RADIATION DOSE REDUCTION: This exam was performed according to the departmental dose-optimization program which includes automated exposure control, adjustment of the mA and/or kV according to patient size and/or use of iterative reconstruction technique. COMPARISON:  None Available. FINDINGS: Alignment: Normal. Skull base and vertebrae: Craniocervical alignment is normal. The atlantodental interval is not widened. No acute fracture of the cervical spine. Vertebral body height is preserved. Soft tissues and spinal canal: Hypertrophy of the ligamentous complex posterior to the odontoid process results in marked narrowing the spinal canal at the craniocervical junction with flattening of the spinal cord, best seen on axial image # 18/9. 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 Superimposed posterior disc osteophyte complex at C5-6 and central disc bulges at C3-4 and to a lesser extent C2-3 and C4-5 result in moderate to severe central canal stenosis with flattening of the thecal sac and an AP diameter of 5-6 mm. No  canal hematoma. No prevertebral soft tissue swelling or paraspinal fluid collections. Disc levels: There is endplate remodeling throughout the cervical spine in keeping with changes of advanced degenerative disc disease. Vacuum disc phenomena noted at C3-C7 prevertebral soft tissues are not thickened on sagittal reformats. Mild to moderate left neuroforaminal narrowing at C5-6. No other significant neuroforaminal narrowing identified. Upper chest: Negative. Other: None IMPRESSION: 1. No acute fracture or listhesis of  the cervical spine. 2. Hypertrophy of the ligamentous complex posterior to the odontoid process results in marked narrowing the spinal canal at the craniocervical junction with flattening of the spinal cord. 3. 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 4. Mild to moderate left neuroforaminal narrowing at C5-6. Electronically Signed   By: Helyn Numbers M.D.   On: 09/24/2022 01:12   CT Renal Stone Study  Result Date: 09/24/2022 CLINICAL DATA:  Abdominal and flank pain.  Stone suspected. EXAM: CT ABDOMEN AND PELVIS WITHOUT CONTRAST TECHNIQUE: Multidetector CT imaging of the abdomen and pelvis was performed following the standard protocol without IV contrast. RADIATION DOSE REDUCTION: This exam was performed according to the departmental dose-optimization program which includes automated exposure control, adjustment of the mA and/or kV according to patient size and/or use of iterative reconstruction technique. COMPARISON:  CT with IV contrast 02/13/2010 FINDINGS: Lower chest: Lung bases again demonstrate mild nonspecific chronic interstitial thickening consistent with chronic interstitial disease. There has been no apparent progression. No focal pneumonia is seen.  There is mild bronchial thickening. The heart is slightly enlarged. There is no substantial pericardial fluid.  Hepatobiliary: The unenhanced liver is homogeneous. Gallbladder and bile ducts are unremarkable. Pancreas: Unremarkable without contrast. Spleen: Unremarkable without contrast.  No splenomegaly. Adrenals/Urinary Tract: There is no adrenal mass. No focal abnormality in the unenhanced renal cortex. There is no evidence of urinary stones or obstruction. The bladder is distended with the dome reaching the L4-5 level. No wall thickening or intravesical stone is seen. There is mild bilateral increased perinephric stranding. This could be senescent and chronic in nature or could indicate an inflammatory process. Please correlate with urinalysis. Stomach/Bowel: No dilatation or wall thickening, including the appendix. Moderate fecal stasis. No mesenteric inflammatory changes. Uncomplicated sigmoid diverticulosis. Vascular/Lymphatic: Aortic atherosclerosis. No enlarged abdominal or pelvic lymph nodes. Reproductive: There is a 2 cm calcified fibroid of the left uterine fundus. Similar sized partially calcified subserosal fibroid of the dorsal lower uterine segment. No adnexal mass is seen. Other: No abdominal wall hernia or abnormality. No abdominopelvic ascites. Musculoskeletal: Degenerative changes and mild dextroscoliosis lumbar spine. Mild-to-moderate hip DJD. There is bridging enthesopathy in the lower thoracic spine. Severe acquired spinal stenosis L3-4 and L4-5 is noted with multilevel degenerative lumbar foraminal compromise. Moderate hip DJD changes. No acute osseous abnormality is seen.  No aggressive lesions. IMPRESSION: 1. No evidence of urinary stones or obstruction. 2. Increased perinephric stranding which could be senescent and chronic in nature or could indicate an inflammatory process. Correlate with urinalysis. 3. Distended urinary bladder without wall thickening or intravesical stone. 4. Constipation and diverticulosis. 5. Aortic atherosclerosis. 6. Chronic interstitial changes in the lung bases. No visible  progression of these findings since 02/13/2010, at least in the lung bases. There is no prior chest CT on file. Aortic Atherosclerosis (ICD10-I70.0). Electronically Signed   By: Almira Bar M.D.   On: 09/24/2022 00:34   CT HEAD WO CONTRAST ( )  Result Date: 09/23/2022 CLINICAL DATA:  Fall, Head trauma, minor (Age >= 65y) EXAM: CT HEAD WITHOUT CONTRAST TECHNIQUE: Contiguous axial images were obtained from the base of the skull through the vertex without intravenous contrast. RADIATION DOSE REDUCTION: This exam was performed according to the departmental dose-optimization program which includes automated exposure control, adjustment of the mA and/or kV according to patient size and/or use of iterative reconstruction technique. COMPARISON:  MRI 11/19/2011 FINDINGS: Brain: Normal anatomic configuration.  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 No acute intracranial hemorrhage or infarct. No abnormal mass effect or midline shift. No abnormal intra or extra-axial mass lesion or fluid collection. Ventricular size is normal. Cerebellum is unremarkable. Vascular: No hyperdense vessel or unexpected calcification. Skull: Normal. Negative for fracture or focal lesion. Sinuses/Orbits: No acute finding. Other: Mastoid air cells and middle ear cavities are clear. There is progressive hypertrophy of the cruciform ligamentous complex posterior to the odontoid with progressive canal narrowing and cord compression at the craniocervical junction. IMPRESSION: 1. No acute intracranial abnormality. 2. 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 3. 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 Electronically Signed   By: Helyn Numbers M.D.   On: 09/23/2022 22:01   DG Chest 2 View  Result Date: 09/23/2022 CLINICAL DATA:  Recurrent falls EXAM: CHEST - 2  VIEW COMPARISON:  Chest radiograph dated 05/04/2022 FINDINGS: Normal lung volumes. No focal consolidations. No pleural effusion or pneumothorax. The heart size and mediastinal contours are within normal limits. No radiographic finding of acute displaced fracture. IMPRESSION: No radiographic finding of acute displaced fracture. No focal consolidations. Electronically Signed   By: Agustin Cree M.D.   On: 09/23/2022 20:38    Microbiology: Results for orders placed or performed during the hospital encounter of 11/18/18  SARS Coronavirus 2 [facility] order, Performed in [facility] hospital lab) Nasopharyngeal Nasopharyngeal Swab     Status: None   Collection Time: 11/18/18  1:47 AM   Specimen: Nasopharyngeal Swab  Result Value Ref Range Status   SARS Coronavirus 2 NEGATIVE NEGATIVE Final    Comment: (NOTE) If result is NEGATIVE SARS-CoV-2 target nucleic acids are NOT DETECTED. The SARS-CoV-2 RNA is generally detectable in upper and lower  respiratory specimens during the acute phase of infection. The lowest  concentration of SARS-CoV-2 viral copies this assay can detect is 250  copies / mL. A negative result does not preclude SARS-CoV-2 infection  and should not be used as the sole basis for treatment or other  patient management decisions.  A negative result may occur with  improper specimen collection / handling, submission of specimen other  than nasopharyngeal swab, presence of viral mutation(s) within the  areas targeted by this assay, and inadequate number of viral copies  (<250 copies / mL). A negative result must be combined with clinical  observations, patient history, and epidemiological information. If result is POSITIVE SARS-CoV-2 target nucleic acids are DETECTED. The SARS-CoV-2 RNA is generally detectable in upper and lower  respiratory specimens dur ing the acute phase of infection.  Positive  results are indicative of active infection with SARS-CoV-2.  Clinical  correlation with  patient history and other diagnostic information is  necessary to determine patient infection status.  Positive results do  not rule out bacterial infection or co-infection with other viruses. If result is PRESUMPTIVE POSTIVE SARS-CoV-2 nucleic acids MAY BE PRESENT.   A presumptive positive result was obtained on the submitted specimen  and confirmed on repeat testing.  While 2019 novel coronavirus  (SARS-CoV-2) nucleic acids may be present in the submitted sample  additional confirmatory testing may be necessary for epidemiological  and / or clinical management purposes  to differentiate between  SARS-CoV-2 and other Sarbecovirus currently known to infect humans.  If clinically indicated additional testing with an alternate test  methodology [phone]) is advised. The SARS-CoV-2 RNA is generally  detectable in  upper and lower respiratory sp ecimens during the acute  phase of infection. The expected result is Negative. Fact Sheet for Patients:  BoilerBrush.com.cy Fact Sheet for Healthcare Providers: https://pope.com/ This test is not yet approved or cleared by the [place] FDA and has been authorized for detection and/or diagnosis of SARS-CoV-2 by FDA under an Emergency Use Authorization (EUA).  This EUA will remain in effect (meaning this test can be used) for the duration of the COVID-19 declaration under Section 564(b)(1) of the Act, 21 U.S.C. section 360bbb-3(b)(1), unless the authorization is terminated or revoked sooner. Performed at [facility], [address], [place], [place] [address]     Labs: CBC: Recent Labs  Lab 09/23/22 1857 09/24/22 1050  WBC 4.1 3.8*  NEUTROABS 2.4  --   HGB 11.3* 11.8*  HCT 35.1* 36.4  MCV 88.2 89.9  PLT 208 200   Basic Metabolic Panel: Recent Labs  Lab 09/23/22 1857 09/24/22 1050  NA 135  --   K 4.1  --   CL 104  --   CO2 22  --   GLUCOSE 136*  --   BUN 27*  --    CREATININE 1.72* 1.18*  CALCIUM 8.9  --    Liver Function Tests: Recent Labs  Lab 09/23/22 1857  AST 18  ALT 13  ALKPHOS 66  BILITOT 0.6  PROT 7.5  ALBUMIN 4.1   CBG: Recent Labs  Lab 09/24/22 1705 09/24/22 1942 09/25/22 0732 09/25/22 1157  GLUCAP 134* 109* 120* 118*    Discharge time spent: greater than 30 minutes.  Signed: Lurene Shadow, MD [facility] 09/25/2022

## 2022-09-26 ENCOUNTER — Telehealth: Payer: Self-pay

## 2022-09-26 ENCOUNTER — Telehealth: Payer: Self-pay | Admitting: Nurse Practitioner

## 2022-09-26 NOTE — Telephone Encounter (Signed)
Noted  

## 2022-09-26 NOTE — Telephone Encounter (Signed)
Lisa [name] [facility] [phone]  They are reporting that the pt goes for a colonoscopy on 6/17. She will go stay with family for 1 week so they will not put her on hteir schedule until the week of 6/24.

## 2022-09-26 NOTE — Transitions of Care (Post Inpatient/ED Visit) (Unsigned)
09/26/2022  Name: Lisa [name] MRN: [id number] DOB: Nov 08, 1950  Today's TOC FU Call Status: Today's TOC FU Call Status:: Successful TOC FU Call Competed TOC FU Call Complete Date: 09/26/22  Transition Care Management Follow-up Telephone Call Date of Discharge: 09/23/22 Discharge Facility: [name]) Type of Discharge: Inpatient Admission Primary Inpatient Discharge Diagnosis:: Dizziness Reason for ED Visit: Other: How have you been since you were released from the hospital?: Better Any questions or concerns?: No  Items Reviewed: Did you receive and understand the discharge instructions provided?: Yes Medications obtained,verified, and reconciled?: Yes (Medications Reviewed) Any new allergies since your discharge?: No Dietary orders reviewed?: No Do you have support at home?: No People in Home: alone  Medications Reviewed Today: Medications Reviewed Today     Reviewed by Annabell Sabal, CMA (Certified Medical Assistant) on 09/26/22 at [phone]  Med List Status: <None>   Medication Order Taking? Sig Documenting Provider Last Dose Status Informant  Accu-Chek Softclix Lancets lancets [id number] Yes Use as instructed [name], [name], NP Taking Active Self  acetaminophen (TYLENOL) 500 MG tablet [id number] Yes Take 1,000 mg by mouth every 6 (six) hours as needed for moderate pain or mild pain. [provider] Taking Active Self  Alcohol Swabs (DROPSAFE ALCOHOL PREP) 70 % PADS [id number] Yes Apply topically. [provider] Taking Active Self  atorvastatin (LIPITOR) 40 MG tablet [id number] Yes Take 40 mg by mouth daily. [provider] Taking Active Self  Blood Glucose Monitoring Suppl (ACCU-CHEK AVIVA PLUS) w/Device KIT [id number] Yes 1 each by Does not apply route daily. [name], [name], NP Taking Active Self  carvedilol (COREG) 12.5 MG tablet [id number] Yes Take 1 tablet (12.5 mg total) by mouth 2 (two) times daily with a meal. Lurene Shadow, MD  Taking Active   cholecalciferol (VITAMIN D3) 25 MCG (1000 UNIT) tablet [id number] Yes Take 1 tablet (1,000 Units total) by mouth in the morning and at bedtime. Lurene Shadow, MD Taking Active   citalopram (CELEXA) 20 MG tablet [id number] Yes TAKE 1 TABLET EVERY DAY [name], [name], NP Taking Active Self  clopidogrel (PLAVIX) 75 MG tablet [id number] Yes Take 1 tablet (75 mg total) by mouth daily. Maretta Bees, MD Taking Active Self  diclofenac Sodium (VOLTAREN) 1 % GEL [id number] Yes Apply 2 g topically 4 (four) times daily as needed (pain). Lurene Shadow, MD Taking Active   glucose blood (ACCU-CHEK AVIVA PLUS) test strip [id number] Yes Use as instructed [name], [name], NP Taking Active Self  glucose blood (ONETOUCH ULTRA) test strip [id number] Yes Use as instructed [name], [name], NP Taking Active Self  melatonin 3 MG TABS tablet [id number] Yes TAKE 1 TABLET AT BEDTIME Ganta, Anupa, DO Taking Active Self  metFORMIN (GLUCOPHAGE) 500 MG tablet [id number] Yes Take 250 mg by mouth 2 (two) times daily with a meal. [provider] Taking Active Self  Multiple Vitamin (MULTIVITAMIN) tablet [id number] Yes Take 1 tablet by mouth daily. [name], [name], NP Taking Active Self  ondansetron (ZOFRAN) 4 MG tablet [id number] Yes TAKE 1 TABLET EVERY 8 HOURS AS NEEDED FOR NAUSEA OR VOMITING  Patient taking differently: Take 4 mg by mouth every 8 (eight) hours as needed for nausea or vomiting.   [name], [name], NP Taking Active Self  pantoprazole (PROTONIX) 40 MG tablet [id number] Yes Take 1 tablet (40 mg total) by mouth 2 (two) times daily. Gerre Scull, NP Taking Active Self  pregabalin (LYRICA) 75 MG capsule [id number] Yes Take 1 capsule (75 mg  total) by mouth daily. [name], [name], NP Taking Active Self  sacubitril-valsartan (ENTRESTO) 49-51 MG [id number] Yes Take 1 tablet by mouth 2 (two) times daily. [provider] Taking Active Self  triamterene-hydrochlorothiazide (MAXZIDE-25)  37.5-25 MG tablet [id number] Yes Take 1 tablet by mouth daily. Gerre Scull, NP Taking Active Self            Home Care and Equipment/Supplies: Were Home Health Services Ordered?: Yes Name of Home Health Agency:: unknown Has Agency set up a time to come to your home?: Yes First Home Health Visit Date: 09/26/22 Any new equipment or medical supplies ordered?: No  Functional Questionnaire: Do you need assistance with bathing/showering or dressing?: No Do you need assistance with meal preparation?: No Do you need assistance with eating?: No Do you have difficulty maintaining continence: No Do you need assistance with getting out of bed/getting out of a chair/moving?: No Do you have difficulty managing or taking your medications?: No  Follow up appointments reviewed: PCP Follow-up appointment confirmed?: No MD Provider Line [phone] Given: No Specialist Hospital Follow-up appointment confirmed?: NA Do you need transportation to your follow-up appointment?: No Do you understand care options if your condition(s) worsen?: Yes-patient verbalized understanding    SIGNATURE Fredirick Maudlin

## 2022-09-26 NOTE — Telephone Encounter (Signed)
I called and spoke with [place] at [facility] and she said that the patient is going to have her colonscopy done on the 6/17 and then going to stay with relatives in [place] then will come back home around the 24th and then home health can come on out to see he then.

## 2022-09-29 ENCOUNTER — Ambulatory Visit (HOSPITAL_BASED_OUTPATIENT_CLINIC_OR_DEPARTMENT_OTHER): Payer: Medicare HMO | Admitting: Anesthesiology

## 2022-09-29 ENCOUNTER — Encounter (HOSPITAL_COMMUNITY)
Admission: RE | Disposition: A | Discharge status: Home / Self Care | Payer: Self-pay | Source: Home / Self Care | Attending: Gastroenterology

## 2022-09-29 ENCOUNTER — Ambulatory Visit (HOSPITAL_COMMUNITY)
Admission: RE | Admit: 2022-09-29 | Discharge: 2022-09-29 | Disposition: A | Discharge status: Home / Self Care | Payer: Medicare HMO | Attending: Gastroenterology | Admitting: Gastroenterology

## 2022-09-29 ENCOUNTER — Other Ambulatory Visit: Payer: Self-pay

## 2022-09-29 ENCOUNTER — Ambulatory Visit (HOSPITAL_COMMUNITY): Payer: Medicare HMO | Admitting: Anesthesiology

## 2022-09-29 DIAGNOSIS — K254 Chronic or unspecified gastric ulcer with hemorrhage: Secondary | ICD-10-CM

## 2022-09-29 DIAGNOSIS — Z09 Encounter for follow-up examination after completed treatment for conditions other than malignant neoplasm: Secondary | ICD-10-CM | POA: Diagnosis not present

## 2022-09-29 DIAGNOSIS — M797 Fibromyalgia: Secondary | ICD-10-CM | POA: Insufficient documentation

## 2022-09-29 DIAGNOSIS — G709 Myoneural disorder, unspecified: Secondary | ICD-10-CM | POA: Insufficient documentation

## 2022-09-29 DIAGNOSIS — Z79899 Other long term (current) drug therapy: Secondary | ICD-10-CM | POA: Insufficient documentation

## 2022-09-29 DIAGNOSIS — Z1211 Encounter for screening for malignant neoplasm of colon: Secondary | ICD-10-CM

## 2022-09-29 DIAGNOSIS — K449 Diaphragmatic hernia without obstruction or gangrene: Secondary | ICD-10-CM | POA: Insufficient documentation

## 2022-09-29 DIAGNOSIS — Z5986 Financial insecurity: Secondary | ICD-10-CM | POA: Diagnosis not present

## 2022-09-29 DIAGNOSIS — Z87891 Personal history of nicotine dependence: Secondary | ICD-10-CM | POA: Insufficient documentation

## 2022-09-29 DIAGNOSIS — K922 Gastrointestinal hemorrhage, unspecified: Secondary | ICD-10-CM

## 2022-09-29 DIAGNOSIS — I2510 Atherosclerotic heart disease of native coronary artery without angina pectoris: Secondary | ICD-10-CM | POA: Insufficient documentation

## 2022-09-29 DIAGNOSIS — K3189 Other diseases of stomach and duodenum: Secondary | ICD-10-CM | POA: Diagnosis not present

## 2022-09-29 DIAGNOSIS — I509 Heart failure, unspecified: Secondary | ICD-10-CM | POA: Diagnosis not present

## 2022-09-29 DIAGNOSIS — Z8711 Personal history of peptic ulcer disease: Secondary | ICD-10-CM | POA: Insufficient documentation

## 2022-09-29 DIAGNOSIS — Z7984 Long term (current) use of oral hypoglycemic drugs: Secondary | ICD-10-CM | POA: Diagnosis not present

## 2022-09-29 DIAGNOSIS — I110 Hypertensive heart disease with heart failure: Secondary | ICD-10-CM | POA: Diagnosis not present

## 2022-09-29 DIAGNOSIS — K648 Other hemorrhoids: Secondary | ICD-10-CM | POA: Insufficient documentation

## 2022-09-29 DIAGNOSIS — Z5982 Transportation insecurity: Secondary | ICD-10-CM | POA: Insufficient documentation

## 2022-09-29 DIAGNOSIS — Z8249 Family history of ischemic heart disease and other diseases of the circulatory system: Secondary | ICD-10-CM | POA: Insufficient documentation

## 2022-09-29 DIAGNOSIS — F32A Depression, unspecified: Secondary | ICD-10-CM | POA: Diagnosis not present

## 2022-09-29 DIAGNOSIS — Z8673 Personal history of transient ischemic attack (TIA), and cerebral infarction without residual deficits: Secondary | ICD-10-CM | POA: Diagnosis not present

## 2022-09-29 DIAGNOSIS — E119 Type 2 diabetes mellitus without complications: Secondary | ICD-10-CM

## 2022-09-29 DIAGNOSIS — K2951 Unspecified chronic gastritis with bleeding: Secondary | ICD-10-CM | POA: Diagnosis not present

## 2022-09-29 DIAGNOSIS — M1990 Unspecified osteoarthritis, unspecified site: Secondary | ICD-10-CM | POA: Diagnosis not present

## 2022-09-29 DIAGNOSIS — Z8619 Personal history of other infectious and parasitic diseases: Secondary | ICD-10-CM

## 2022-09-29 DIAGNOSIS — K279 Peptic ulcer, site unspecified, unspecified as acute or chronic, without hemorrhage or perforation: Secondary | ICD-10-CM

## 2022-09-29 HISTORY — PX: BIOPSY: SHX5522

## 2022-09-29 HISTORY — PX: ESOPHAGOGASTRODUODENOSCOPY (EGD) WITH PROPOFOL: SHX5813

## 2022-09-29 HISTORY — PX: COLONOSCOPY: SHX5424

## 2022-09-29 LAB — GLUCOSE, CAPILLARY: Glucose-Capillary: 91 mg/dL (ref 70–99)

## 2022-09-29 SURGERY — ESOPHAGOGASTRODUODENOSCOPY (EGD) WITH PROPOFOL
Anesthesia: Monitor Anesthesia Care

## 2022-09-29 MED ORDER — SODIUM CHLORIDE 0.9 % IV SOLN: INTRAVENOUS | Status: DC

## 2022-09-29 MED ORDER — PROPOFOL 500 MG/50ML IV EMUL
INTRAVENOUS | Status: DC | PRN
  Administered 2022-09-29: 150 ug/kg/min via INTRAVENOUS

## 2022-09-29 MED ORDER — LACTATED RINGERS IV SOLN
INTRAVENOUS | Status: AC | PRN
  Administered 2022-09-29: 1000 mL via INTRAVENOUS

## 2022-09-29 MED ORDER — PROPOFOL 10 MG/ML IV BOLUS
INTRAVENOUS | Status: DC | PRN
  Administered 2022-09-29 (×2): 50 mg via INTRAVENOUS
  Administered 2022-09-29: 25 mg via INTRAVENOUS
  Administered 2022-09-29: 50 mg via INTRAVENOUS
  Administered 2022-09-29: 25 mg via INTRAVENOUS

## 2022-09-29 SURGICAL SUPPLY — 15 items

## 2022-09-29 NOTE — Anesthesia Preprocedure Evaluation (Addendum)
Anesthesia Evaluation  Patient identified by MRN, date of birth, ID band Patient awake    Reviewed: Allergy & Precautions, NPO status , Patient's Chart, lab work & pertinent test results, reviewed documented beta blocker date and time   History of Anesthesia Complications Negative for: history of anesthetic complications  Airway Mallampati: II  TM Distance: >3 FB Neck ROM: Full    Dental  (+) Dental Advisory Given, Missing, Poor Dentition,    Pulmonary former smoker   Pulmonary exam normal        Cardiovascular hypertension, Pt. on medications and Pt. on home beta blockers + CAD and +CHF  Normal cardiovascular exam+ Valvular Problems/Murmurs MR   ECHO 09/24/22 1. Left ventricular ejection fraction, by estimation, is 50 to 55%. The  left ventricle has low normal function. The left ventricle has no regional  wall motion abnormalities. There is moderate asymmetric left ventricular  hypertrophy of the infero-lateral   and inferior segments. Left ventricular diastolic parameters are  consistent with Grade I diastolic dysfunction (impaired relaxation).   2. Right ventricular systolic function is normal. The right ventricular  size is normal.   3. There is no evidence of cardiac tamponade.   4. The mitral valve is abnormal. Moderate mitral valve regurgitation.  Eccentric jet with horizontal display artifact, severity of jet may be  underestimated.   5. The aortic valve was not well visualized. Aortic valve regurgitation  is not visualized. No aortic stenosis is present.   '20 TTE - EF 25-30%. There is mildly increased left ventricular wall thickness. Left ventricular diastolic Doppler parameters are consistent with pseudonormalization. Diffuse hypokinesis. The right ventricle has moderately reduced systolic function. TRight ventricular systolic pressure is moderately elevated with an estimated pressure of 61.0 mmHg. Left atrial size was  severely dilated. Trivial pericardial effusion is present. Mitral valve regurgitation is mild to moderate      Neuro/Psych  PSYCHIATRIC DISORDERS  Depression     Neuromuscular disease CVA, Residual Symptoms    GI/Hepatic Neg liver ROS, PUD,,,  Endo/Other  diabetes, Type 2, Oral Hypoglycemic Agents    Renal/GU ARFRenal disease     Musculoskeletal  (+) Arthritis ,  Fibromyalgia -  Abdominal   Peds  Hematology  (+) Blood dyscrasia, anemia  On plavix    Anesthesia Other Findings   Reproductive/Obstetrics                             Anesthesia Physical Anesthesia Plan  ASA: 3  Anesthesia Plan: MAC   Post-op Pain Management: Minimal or no pain anticipated   Induction:   PONV Risk Score and Plan: 2 and Propofol infusion and Treatment may vary due to age or medical condition  Airway Management Planned: Nasal Cannula and Natural Airway  Additional Equipment: None  Intra-op Plan:   Post-operative Plan:   Informed Consent: I have reviewed the patients History and Physical, chart, labs and discussed the procedure including the risks, benefits and alternatives for the proposed anesthesia with the patient or authorized representative who has indicated his/her understanding and acceptance.       Plan Discussed with: CRNA and Anesthesiologist  Anesthesia Plan Comments:        Anesthesia Quick Evaluation

## 2022-09-29 NOTE — H&P (Signed)
[facility] History and Physical   Primary Care Physician:  Gerre Scull, NP   Reason for Procedure:   Follow up gastric ulcer, colon cancer screening  Plan:    EGD and colonoscopy     HPI: Lisa [name] is a 72 y.o. female  here for EGD and colonoscopy to evaluate issues as outlined above. History of GI bleed earlier this  year, secondary to gastric ulcers. Treated for that and for H pylori and no further bleeding. EGD to re-evaluate for mucosal healing and H pylori eradication. Also, has never had a prior colonoscopy, to be done for screening purposes. Patient denies any bowel symptoms at this time. No family history of colon cancer known. Otherwise feels well without any cardiopulmonary symptoms today. History of CHF, on Plavix, which has been held for 5 days. She wishes to proceed after discussion of risks / benefits.   I have discussed risks / benefits of anesthesia and endoscopic procedure with [facility] and they wish to proceed with the exams as outlined today.    Past Medical History:  Diagnosis Date   ARF (acute renal failure) (HCC) 11/18/2018   Coronary artery disease    Diabetes mellitus (HCC) 2007   High cholesterol 2007   Hypertension    Peptic ulcer 1965   Stroke [facility])     Past Surgical History:  Procedure Laterality Date   CESAREAN SECTION     ESOPHAGOGASTRODUODENOSCOPY (EGD) WITH PROPOFOL N/A 05/04/2022   Procedure: ESOPHAGOGASTRODUODENOSCOPY (EGD) WITH PROPOFOL;  Surgeon: Benancio Deeds, MD;  Location: [facility] ENDOSCOPY;  Service: Gastroenterology;  Laterality: N/A;   RIGHT/LEFT HEART CATH AND CORONARY ANGIOGRAPHY N/A 11/19/2018   Procedure: RIGHT/LEFT HEART CATH AND CORONARY ANGIOGRAPHY;  Surgeon: Orpah Cobb, MD;  Location: [facility] INVASIVE CV LAB;  Service: Cardiovascular;  Laterality: N/A;    Prior to Admission medications   Medication Sig Start Date End Date Taking? Authorizing Provider  Accu-Chek Softclix Lancets lancets Use as  instructed 05/19/22  Yes [name], [name], NP  acetaminophen (TYLENOL) 500 MG tablet Take 1,000 mg by mouth every 6 (six) hours as needed for moderate pain or mild pain.   Yes [provider]  Alcohol Swabs (DROPSAFE ALCOHOL PREP) 70 % PADS Apply topically. 05/29/22  Yes [provider]  atorvastatin (LIPITOR) 40 MG tablet Take 40 mg by mouth daily.   Yes [provider]  Blood Glucose Monitoring Suppl (ACCU-CHEK AVIVA PLUS) w/Device KIT 1 each by Does not apply route daily. 05/09/22  Yes [name], [name], NP  carvedilol (COREG) 12.5 MG tablet Take 1 tablet (12.5 mg total) by mouth 2 (two) times daily with a meal. 09/25/22  Yes Lurene Shadow, MD  cholecalciferol (VITAMIN D3) 25 MCG (1000 UNIT) tablet Take 1 tablet (1,000 Units total) by mouth in the morning and at bedtime. 09/25/22  Yes Lurene Shadow, MD  citalopram (CELEXA) 20 MG tablet TAKE 1 TABLET EVERY DAY 09/02/22  Yes [name], [name], NP  clopidogrel (PLAVIX) 75 MG tablet Take 1 tablet (75 mg total) by mouth daily. 05/07/22  Yes Ghimire, Werner Lean, MD  diclofenac Sodium (VOLTAREN) 1 % GEL Apply 2 g topically 4 (four) times daily as needed (pain). 09/25/22  Yes Lurene Shadow, MD  glucose blood (ACCU-CHEK AVIVA PLUS) test strip Use as instructed 05/19/22  Yes [name], [name], NP  glucose blood (ONETOUCH ULTRA) test strip Use as instructed 04/17/22  Yes [name], [name], NP  melatonin 3 MG TABS tablet TAKE 1 TABLET AT  BEDTIME 03/17/22  Yes Ganta, Anupa, DO  metFORMIN (GLUCOPHAGE) 500 MG tablet Take 250 mg by mouth 2 (two) times daily with a meal.   Yes [provider]  Multiple Vitamin (MULTIVITAMIN) tablet Take 1 tablet by mouth daily. 09/02/22  Yes [name], [name], NP  ondansetron (ZOFRAN) 4 MG tablet TAKE 1 TABLET EVERY 8 HOURS AS NEEDED FOR NAUSEA OR VOMITING Patient taking differently: Take 4 mg by mouth every 8 (eight) hours as needed for nausea or vomiting. 06/03/22  Yes [name], [name], NP  pantoprazole  (PROTONIX) 40 MG tablet Take 1 tablet (40 mg total) by mouth 2 (two) times daily. 06/19/22  Yes [name], [name], NP  pregabalin (LYRICA) 75 MG capsule Take 1 capsule (75 mg total) by mouth daily. 09/02/22  Yes [name], [name], NP  sacubitril-valsartan (ENTRESTO) 49-51 MG Take 1 tablet by mouth 2 (two) times daily.   Yes [provider]  triamterene-hydrochlorothiazide (MAXZIDE-25) 37.5-25 MG tablet Take 1 tablet by mouth daily. 09/02/22  Yes [name], [name], NP    Current Facility-Administered Medications  Medication Dose Route Frequency Provider Last Rate Last Admin   0.9 %  sodium chloride infusion   Intravenous Continuous Jaionna Weisse, Willaim Rayas, MD        Allergies as of 07/17/2022 - Review Complete 07/17/2022  Allergen Reaction Noted   Ace inhibitors  03/04/2013   Aspirin Other (See Comments) 11/19/2011   Codeine  11/19/2011   Hydrocodone  11/19/2011    Family History  Problem Relation Age of Onset   Kidney disease Father    Heart disease Father    Cancer Brother        Throat    Birth defects Son     Social History   Socioeconomic History   Marital status: Widowed    Spouse name: Mervin Yom   Number of children: 2   Years of education: 11   Highest education level: Not on file  Occupational History   Occupation: Homemaker   Tobacco Use   Smoking status: Former    Packs/day: 0.25    Years: 25.00    Additional pack years: 0.00    Total pack years: 6.25    Types: Cigarettes    Quit date: 04/07/2013    Years since quitting: 9.4   Smokeless tobacco: Former    Quit date: 04/07/2013   Tobacco comments:    2 cigs a day  Vaping Use   Vaping Use: Never used  Substance and Sexual Activity   Alcohol use: Not Currently    Comment: Socially, drinks 3 drinks twice per week    Drug use: No   Sexual activity: Yes  Other Topics Concern   Not on file  Social History Narrative   Lives with cousin. Was homeless for 1 year.Husband died Apr 22, 2012.    Social  Determinants of Health   Financial Resource Strain: Low Risk  (06/09/2022)   Overall Financial Resource Strain (CARDIA)    Difficulty of Paying Living Expenses: Not hard at all  Recent Concern: Financial Resource Strain - Medium Risk (04/24/2022)   Overall Financial Resource Strain (CARDIA)    Difficulty of Paying Living Expenses: Somewhat hard  Food Insecurity: Food Insecurity Present (09/24/2022)   Hunger Vital Sign    Worried About Running Out of Food in the Last Year: Often true    Ran Out of Food in the Last Year: Often true  Transportation Needs: Unmet Transportation Needs (09/24/2022)   PRAPARE - Transportation  Lack of Transportation (Medical): Yes    Lack of Transportation (Non-Medical): Yes  Physical Activity: Inactive (06/09/2022)   Exercise Vital Sign    Days of Exercise per Week: 0 days    Minutes of Exercise per Session: 0 min  Stress: No Stress Concern Present (06/09/2022)   [employer] of Occupational Health - Occupational Stress Questionnaire    Feeling of Stress : Not at all  Social Connections: Moderately Isolated (04/24/2022)   Social Connection and Isolation Panel [NHANES]    Frequency of Communication with Friends and Family: More than three times a week    Frequency of Social Gatherings with Friends and Family: More than three times a week    Attends Religious Services: More than 4 times per year    Active Member of [employer] or Organizations: No    Attends Banker Meetings: Not on file    Marital Status: Widowed  Intimate Partner Violence: Not At Risk (09/24/2022)   Humiliation, Afraid, Rape, and Kick questionnaire    Fear of Current or Ex-Partner: No    Emotionally Abused: No    Physically Abused: No    Sexually Abused: No    Review of Systems: All other review of systems negative except as mentioned in the HPI.  Physical Exam: Vital signs BP (!) 166/52   Pulse 93   Temp 97.6 F (36.4 C) (Temporal)   Resp (!) 22   Ht 5\' 5"  (1.651 m)    Wt 57.2 kg   SpO2 100%   BMI 20.97 kg/m   General:   Alert,  Well-developed, pleasant and cooperative in NAD Lungs:  Clear throughout to auscultation.   Heart:  Regular rate and rhythm Abdomen:  Soft, nontender and nondistended.   Neuro/Psych:  Alert and cooperative. Normal mood and affect. A and O x 3  Harlin Rain, MD [facility]

## 2022-09-29 NOTE — Op Note (Signed)
[facility] Patient Name: Lisa [name] Procedure Date: 09/29/2022 MRN: [id number] Attending MD: Willaim Rayas. Adela Lank , MD, [id number] Date of Birth: 1950/07/22 CSN: [id number] Age: 72 Admit Type: Outpatient Procedure:                Colonoscopy Indications:              Screening for colorectal malignant neoplasm, This                            is the patient's first colonoscopy Providers:                Willaim Rayas. Adela Lank, MD, Marge Duncans, RN, Beryle Beams, Technician, Leona Singleton CRNA Referring MD:              Medicines:                Monitored Anesthesia Care Complications:            No immediate complications. Estimated blood loss:                            Minimal. Estimated Blood Loss:     Estimated blood loss was minimal. Procedure:                Pre-Anesthesia Assessment:                           - Prior to the procedure, a History and Physical                            was performed, and patient medications and                            allergies were reviewed. The patient's tolerance of                            previous anesthesia was also reviewed. The risks                            and benefits of the procedure and the sedation                            options and risks were discussed with the patient.                            All questions were answered, and informed consent                            was obtained. Prior Anticoagulants: The patient has                            taken Plavix (clopidogrel), last dose was 5 days  prior to procedure. ASA Grade Assessment: III - A                            patient with severe systemic disease. After                            reviewing the risks and benefits, the patient was                            deemed in satisfactory condition to undergo the                            procedure.                           After obtaining  informed consent, the colonoscope                            was passed under direct vision. Throughout the                            procedure, the patient's blood pressure, pulse, and                            oxygen saturations were monitored continuously. The                            [id number]-HQ190L ([id number]) Olympus colonoscope was                            introduced through the anus and advanced to the the                            cecum, identified by appendiceal orifice and                            ileocecal valve. The colonoscopy was performed                            without difficulty. The patient tolerated the                            procedure well. The quality of the bowel                            preparation was fair. The ileocecal valve,                            appendiceal orifice, and rectum were photographed. Scope In: 8:35:06 AM Scope Out: 8:47:52 AM Scope Withdrawal Time: 0 hours 10 minutes 44 seconds  Total Procedure Duration: 0 hours 12 minutes 46 seconds  Findings:      The perianal and digital rectal examinations were normal.      A large amount of semi-liquid stool was found in the entire colon,  making visualization difficult initially. Lavage of the colon was       performed using a large amount of sterile water, resulting in clearance       with adequate in most areas visualization. Small portion of ascending       colon and left colon had residual vegetable matter that was washed       around. I did not see any overt polyps in these areas however very small       or flat lesions may not have been appreciated in some areas.      Internal hemorrhoids were found during retroflexion. The hemorrhoids       were small.      The exam was otherwise without abnormality. Retroflexed views were not       obtained given small size of rectum and poor air retention. Impression:               - Preparation of the colon was fair - lavaged and                             mostly adequate views obtained, a few areas as                            described with slightly limited views.                           - Internal hemorrhoids.                           - The examination was otherwise normal.                           - No polyps Moderate Sedation:      No moderate sedation, case performed with MAC Recommendation:           - Patient has a contact number available for                            emergencies. The signs and symptoms of potential                            delayed complications were discussed with the                            patient. Return to normal activities tomorrow.                            Written discharge instructions were provided to the                            patient.                           - Resume previous diet.                           - Continue present medications.                           -  Resume Plavix today                           - Will discuss with the patient if she wishes to                            have another colonoscopy given mostly adequate prep                            but with some areas with slightly limited views,                            could consider another exam in a few years, prior                            to stopping colonosopy exams, if she is willing and                            feeling okay at the time. No high risk lesions on                            this exam. Procedure Code(s):        --- Professional ---                           [phone], Colonoscopy, flexible; diagnostic, including                            collection of specimen(s) by brushing or washing,                            when performed (separate procedure) Diagnosis Code(s):        --- Professional ---                           Z12.11, Encounter for screening for malignant                            neoplasm of colon                           K64.8, Other hemorrhoids CPT copyright 2022  American Medical Association. All rights reserved. The codes documented in this report are preliminary and upon coder review may  be revised to meet current compliance requirements. Viviann Spare P. Kasten Leveque, MD 09/29/2022 8:55:57 AM This report has been signed electronically. Number of Addenda: 0

## 2022-09-29 NOTE — Transfer of Care (Signed)
Immediate Anesthesia Transfer of Care Note  Patient: Lisa [name]  Procedure(s) Performed: ESOPHAGOGASTRODUODENOSCOPY (EGD) WITH PROPOFOL COLONOSCOPY BIOPSY  Patient Location: PACU  Anesthesia Type:MAC  Level of Consciousness: drowsy and patient cooperative  Airway & Oxygen Therapy: Patient Spontanous Breathing and Patient connected to face mask oxygen  Post-op Assessment: Report given to RN and Post -op Vital signs reviewed and stable  Post vital signs: Reviewed and stable  Last Vitals:  Vitals Value Taken Time  BP    Temp    Pulse 79 09/29/22 0853  Resp 19 09/29/22 0853  SpO2 100 % 09/29/22 0853  Vitals shown include unvalidated device data.  Last Pain:  Vitals:   09/29/22 0700  TempSrc: Temporal  PainSc: 10-Worst pain ever         Complications: No notable events documented.

## 2022-09-29 NOTE — Op Note (Signed)
[name] Patient Name: Lisa [name] Procedure Date: 09/29/2022 MRN: [id number] Attending MD: Willaim Rayas. Adela Lank , MD, [id number] Date of Birth: February 09, 1951 CSN: [id number] Age: 71 Admit Type: Outpatient Procedure:                Upper GI endoscopy Indications:              history of GI bleed January 2024 secondary to                            multiple gastric ulcers, tested positive for H                            pylori and treated. On protonix - EGD to confirm                            mucosal healing of prior ulcers Providers:                Willaim Rayas. Adela Lank, MD, Marge Duncans, RN, Beryle Beams, Technician, Leona Singleton CRNA Referring MD:              Medicines:                Monitored Anesthesia Care Complications:            No immediate complications. Estimated blood loss:                            Minimal. Estimated Blood Loss:     Estimated blood loss was minimal. Procedure:                Pre-Anesthesia Assessment:                           - Prior to the procedure, a History and Physical                            was performed, and patient medications and                            allergies were reviewed. The patient's tolerance of                            previous anesthesia was also reviewed. The risks                            and benefits of the procedure and the sedation                            options and risks were discussed with the patient.                            All questions were answered, and informed consent  was obtained. Prior Anticoagulants: The patient has                            taken Plavix (clopidogrel), last dose was 5 days                            prior to procedure. ASA Grade Assessment: III - A                            patient with severe systemic disease. After                            reviewing the risks and benefits, the patient was                             deemed in satisfactory condition to undergo the                            procedure.                           After obtaining informed consent, the endoscope was                            passed under direct vision. Throughout the                            procedure, the patient's blood pressure, pulse, and                            oxygen saturations were monitored continuously. The                            [id number]-H190 ([id number]) Olympus endoscope was introduced                            through the mouth, and advanced to the second part                            of duodenum. The upper GI endoscopy was                            accomplished without difficulty. The patient                            tolerated the procedure well. Scope In: Scope Out: Findings:      Esophagogastric landmarks were identified: the Z-line was found at 40       cm, the gastroesophageal junction was found at 40 cm and the upper       extent of the gastric folds was found at 41 cm from the incisors.      A 1 cm hiatal hernia was present.      The z line was slightly irregular but did not meet criteria for  Barrett's esophagus. The exam of the esophagus was otherwise normal.      A deformity was found in the prepyloric region of the stomach - scarring       from prior ulcers in the area, however the pyloric channel was widely       patent.      Diffuse atrophic mucosa was found in the entire examined stomach.      The exam of the stomach was otherwise normal.      Biopsies were taken with a cold forceps for Helicobacter pylori testing.      The examined duodenum was normal. Impression:               - Esophagogastric landmarks identified.                           - 1 cm hiatal hernia.                           - Normal esophagus otherwise.                           - Acquired deformity in the prepyloric region of                            the stomach - scarring from prior ulcers,  but                            widely patent pyloric channel.                           - Gastric mucosal atrophy.                           - Normal stomach otherwise - biopsies taken for H                            pylori eradication testing.                           - Normal examined duodenum. Moderate Sedation:      No moderate sedation, case performed with MAC Recommendation:           - Patient has a contact number available for                            emergencies. The signs and symptoms of potential                            delayed complications were discussed with the                            patient. Return to normal activities tomorrow.                            Written discharge instructions were provided to the  patient.                           - Resume previous diet.                           - Continue present medications.                           - Can reduce Protonix to once daily dosing                           - Okay to resume Plavix today                           - Await pathology results. Procedure Code(s):        --- Professional ---                           [phone], Esophagogastroduodenoscopy, flexible,                            transoral; with biopsy, single or multiple Diagnosis Code(s):        --- Professional ---                           K44.9, Diaphragmatic hernia without obstruction or                            gangrene                           K31.89, Other diseases of stomach and duodenum                           K25.4, Chronic or unspecified gastric ulcer with                            hemorrhage CPT copyright 2022 American Medical Association. All rights reserved. The codes documented in this report are preliminary and upon coder review may  be revised to meet current compliance requirements. Viviann Spare P. Glenda Spelman, MD 09/29/2022 9:02:12 AM This report has been signed electronically. Number of Addenda: 0

## 2022-09-29 NOTE — Discharge Instructions (Signed)

## 2022-09-29 NOTE — Anesthesia Procedure Notes (Signed)
Procedure Name: MAC Date/Time: 09/29/2022 8:27 AM  Performed by: Adria Dill, CRNAPre-anesthesia Checklist: Patient identified, Emergency Drugs available, Suction available and Patient being monitored Patient Re-evaluated:Patient Re-evaluated prior to induction Oxygen Delivery Method: Simple face mask Preoxygenation: Pre-oxygenation with 100% oxygen Induction Type: IV induction Airway Equipment and Method: Bite block Placement Confirmation: positive ETCO2 and breath sounds checked- equal and bilateral Dental Injury: Teeth and Oropharynx as per pre-operative assessment

## 2022-09-29 NOTE — Anesthesia Postprocedure Evaluation (Signed)
Anesthesia Post Note  Patient: Lisa [name]  Procedure(s) Performed: ESOPHAGOGASTRODUODENOSCOPY (EGD) WITH PROPOFOL COLONOSCOPY BIOPSY     Patient location during evaluation: PACU Anesthesia Type: MAC Level of consciousness: awake and alert Pain management: pain level controlled Vital Signs Assessment: post-procedure vital signs reviewed and stable Respiratory status: spontaneous breathing, nonlabored ventilation, respiratory function stable and patient connected to nasal cannula oxygen Cardiovascular status: stable and blood pressure returned to baseline Postop Assessment: no apparent nausea or vomiting Anesthetic complications: no   No notable events documented.  Last Vitals:  Vitals:   09/29/22 0910 09/29/22 0920  BP: 136/69 (!) 150/57  Pulse: 70 67  Resp: 15 13  Temp:    SpO2: 98% 100%    Last Pain:  Vitals:   09/29/22 0920  TempSrc:   PainSc: 0-No pain                 Tabrina Esty

## 2022-10-01 ENCOUNTER — Encounter: Payer: Self-pay | Admitting: Gastroenterology

## 2022-10-01 LAB — SURGICAL PATHOLOGY

## 2022-10-02 ENCOUNTER — Encounter (HOSPITAL_COMMUNITY): Payer: Self-pay | Admitting: Gastroenterology

## 2022-10-02 ENCOUNTER — Telehealth: Payer: Self-pay

## 2022-10-02 NOTE — Patient Outreach (Signed)
  Care Coordination   10/02/2022 Name: Lisa [name] MRN: [id number] DOB: March 29, 1951   Care Coordination Outreach Attempts:  SW placed an unsuccessful outbound call to follow up on patient care coordination needs. Patients voice mailbox was full which prohibited SW from leaving a voice message.  Follow Up Plan:  Additional outreach attempts will be made to offer the patient care coordination information and services.   Encounter Outcome:  No Answer   Care Coordination Interventions:  No, not indicated    [name], BSW, CDP Social Worker, Certified Dementia Practitioner [facility] Care Management  Care Coordination [phone]

## 2022-10-07 ENCOUNTER — Ambulatory Visit: Payer: Self-pay

## 2022-10-07 ENCOUNTER — Other Ambulatory Visit: Payer: Self-pay | Admitting: Family

## 2022-10-07 DIAGNOSIS — R29898 Other symptoms and signs involving the musculoskeletal system: Secondary | ICD-10-CM

## 2022-10-07 NOTE — Patient Instructions (Signed)
Visit Information  Thank you for taking time to visit with me today. Please don't hesitate to contact me if I can be of assistance to you.   Following are the goals we discussed today:  - Call [facility] to request assistance with obtaining a bed   Our next appointment is by telephone on 7/9 at 1:00 pm  Please call the care guide team at [phone] if you need to cancel or reschedule your appointment.   If you are experiencing a Mental Health or Behavioral Health Crisis or need someone to talk to, please call 1-800-273-TALK (toll free, 24 hour hotline) go to [facility] [address] [place] [phone]) call 911  The patient verbalized understanding of instructions, educational materials, and care plan provided today and DECLINED offer to receive copy of patient instructions, educational materials, and care plan.   [name], BSW, CDP Social Worker, Certified Dementia Practitioner [facility] Care Management  Care Coordination [phone]

## 2022-10-07 NOTE — Patient Outreach (Signed)
  Care Coordination   Follow Up Visit Note   10/07/2022 Name: Lisa [name] MRN: [id number] DOB: 1951-02-26  Lisa [name] is a 72 y.o. year old female who sees [name], [name], NP for primary care. I spoke with  Frazier Richards Carrell by phone today.  What matters to the patients health and wellness today?  The patient would like a raised toilet seat    Goals Addressed             This Visit's Progress    Care Coordination Activities   On track    Care Coordination Interventions: Discussed the patient stayed with her sister for one week after her recent hospitalization. She is now back at home and feeling well Reviewed the patient has yet to obtain abed and is still sleeping on the couch Re-educated the patient on [employer] advising she must be referred by a participating agency to access this resource. The patient is already engaged with [facility] for food support which is a participating agency. Provided the patient with the number to [facility] advising she contact them directly to request assistance with obtaining a bed Determined the patient would like to receive meals on wheels - referral placed to [employer] [facility] [place] via [id number]. The patient is aware there is a wait list and she will be contacted by [employer] to assess eligibility  Discussed the patient is having difficulty transferring to and from the toilet due to it being so low Contacted the patients primary care provider requesting orders for a 3 in 1 commode in order for the patient to use as a raised toilet seat Reminded the patient of her upcomming appointment with RN Care Manager on 6/26        SDOH assessments and interventions completed:  No     Care Coordination Interventions:  Yes, provided   Interventions Today    Flowsheet Row Most Recent Value  Chronic Disease   Chronic disease during today's visit Hypertension (HTN), Congestive Heart Failure (CHF)   General Interventions   General Interventions Discussed/Reviewed [employer], Horticulturist, commercial (DME), Communication with  Communication with PCP/Specialists  Nutrition Interventions   Nutrition Discussed/Reviewed Nutrition Discussed        Follow up plan: Follow up call scheduled for 7/9    Encounter Outcome:  Pt. Visit Completed   [name], [name], CDP Social Worker, Certified Dementia Practitioner [facility] Care Management  Care Coordination [phone]

## 2022-10-09 ENCOUNTER — Ambulatory Visit (INDEPENDENT_AMBULATORY_CARE_PROVIDER_SITE_OTHER): Payer: Medicare HMO

## 2022-10-09 ENCOUNTER — Telehealth: Payer: Medicare PPO

## 2022-10-09 DIAGNOSIS — E119 Type 2 diabetes mellitus without complications: Secondary | ICD-10-CM

## 2022-10-09 DIAGNOSIS — I10 Essential (primary) hypertension: Secondary | ICD-10-CM

## 2022-10-09 NOTE — Chronic Care Management (AMB) (Signed)
Chronic Care Management   CCM RN Visit Note  10/09/2022 Name: Lisa [name] MRN: [id number] DOB: 1950/06/22  Subjective: Lisa [name] is a 72 y.o. year old female who is a primary care patient of [name], [name], NP. The patient was referred to the Chronic Care Management team for assistance with care management needs subsequent to provider initiation of CCM services and plan of care.    Today's Visit:  Engaged with patient by telephone for follow up visit.        Goals Addressed             This Visit's Progress    CCM Expected Outcome:  Monitor, Self-Manage and Reduce Symptoms of Diabetes       Current Barriers:  Knowledge Deficits related to the need to check blood sugars on a regular basis, preventive care needs, and importance of following recommendations by the provider for effective management of DM and other chronic conditions Care Coordination needs related to resources the patient needs for senior housing so she can have a safe place to live without fear of being asked to move out in a patient with DM Chronic Disease Management support and education needs related to effective management of DM Lacks caregiver support.  Corporate treasurer.  Lab Results  Component Value Date   HGBA1C 5.8 04/17/2022     Planned Interventions: Provided education to patient about basic DM disease process. Review and education. The patient has multiple chronic conditions impacting her care. Her DM are stable. Discussed changes to monitor for and when to call the provider.; Reviewed medications with patient and discussed importance of medication adherence. 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 Denies any swelling. States compliance with medications.The patient sees the pcp on a regular basis.       Reviewed prescribed diet with patient heart healthy/ADA diet. Education and support given. The  patient states she is staying away for spicy foods. Is taking protonix and also has medications for nausea if she needs them. She says she has only had to take a couple of times. EGD is scheduled for  09-29-2022. The patient completed her EGD and then stayed with her sister x 1 week after her procedure. She is back home and doing well. She does want a riser for the toilet. One has been ordered by the pcp. The patient wanted details for getting the device. A message has been sent to staff asking for someone to reach out to the patient to give her requested information.  Counseled on importance of regular laboratory monitoring as prescribed. Has regular lab testing;        Discussed plans with patient for ongoing care management follow up and provided patient with direct contact information for care management team;      Provided patient with written educational materials related to hypo and hyperglycemia and importance of correct treatment. Denies any drops in her blood sugars or highs. Will continue to monitor;       Reviewed scheduled/upcoming provider appointments including: 09-02-2022 at 1040am;         Ongoing support and education from the SW. The patient is in her own apartment but she would like to find another apartment. She states she had a critter recently and this upset her. She states that she is paying 1000.00 a month and she should not have to deal with any critters. Will  send a message to the SW and let her know she is interested in other places she could rent.   The patient is working on a regular basis with the SW. The SW is scheduled for follow up in early July with the patient. The patient still does not have a bed and she states she lost the number to [facility]. The RNCM gave the patient to [facility] today: [phone] and advised her to call and ask about help with getting a bed to use. The patient verbalized understanding. She states her niece got her a fan to put in her home  and that is helping keep her living area cooler.  Review of patient status, including review of consultants reports, relevant laboratory and other test results, and medications completed;       Advised patient to discuss changes in her DM, Questions, and concerns with provider;      Screening for signs and symptoms of depression related to chronic disease state;        Assessed social determinant of health barriers;      The patient has had 2 falls recently due to dizziness. She states that once she fell out of the bathtub and once she fell when she was in the kitchen getting some water. She states she has a life alert system now and she is thankful for that. She is wanting a toilet riser and one has been ordered for her. Have alerted the office to let them know she needs to know the details for obtaining the device. Will continue to monitor for changes.   Symptom Management: Take medications as prescribed   Attend all scheduled provider appointments Call provider office for new concerns or questions  call the Suicide and Crisis Lifeline: 988 call the [place] National Suicide Prevention Lifeline: (520)115-2502 or TTY: 959-228-6127 TTY 873-698-7195) to talk to a trained counselor call 1-800-273-TALK (toll free, 24 hour hotline) if experiencing a Mental Health or Behavioral Health Crisis  schedule appointment with eye doctor check feet daily for cuts, sores or redness trim toenails straight across wash and dry feet carefully every day wear comfortable, cotton socks wear comfortable, well-fitting shoes  Follow Up Plan: Telephone follow up appointment with care management team member scheduled for: 11-06-2022 at 0900 am       CCM Expected Outcome:  Monitor, Self-Manage, and Reduce Symptoms of Hypertension       Current Barriers:  Knowledge Deficits related to the benefits of taking blood pressures on a regular basis to monitor changes in blood pressures and decrease risk of heart attack and  stroke Care Coordination needs related to educational needs for a patient with HTN and other chronic conditions and resources in the community to help patient with housing needs  in a patient with HTN Chronic Disease Management support and education needs related to effective management of HTN Lacks caregiver support.  Needs stable housing, currently lives with her cousin but needs to find a place of her own. Asking for help with resources- 07-18-2022- Is in her own apartment now  BP Readings from Last 3 Encounters:  09/29/22 (!) 150/57  09/25/22 (!) 125/55  09/02/22 (!) 130/58     Planned Interventions: Evaluation of current treatment plan related to hypertension self management and patient's adherence to plan as established by provider.The patient is doing better with her blood pressures. The patient states she is doing well and is recovering well with her recent EGD. Denies any more issues with being  dizzy.  Provided education to patient re: stroke prevention, s/s of heart attack and stroke. The patient has a history of stroke and the patient mindful of the impact of uncontrolled blood pressures and risk of HA and stroke; Reviewed prescribed diet heart healthy/ADA diet- the patient states she is mindful of her dietary restrictions and she monitors her sodium and sugar intake. She also is monitoring for spicy foods now and knows she needs to watch her intake closely due to the recent hospitalization with finding ulcers present. The patient states today that she is doing "real good" because she "loves herself". The patient has a friend that has been helping with food right  now. She states she is good to her. She also is with the program [facility]. She is signed up for MOW in [facility] but is aware of the waiting list.  Reviewed medications with patient and discussed importance of compliance. The patient states that she does have her medications and she is taking her medications as  directed. Has her medications and is compliant with medications. The patient is back on her hydralazine 25 mg BID, Entresto 49-51 mg BID, Amlodipine 5 mg QD and Carvedilol 6.25 mg BID. She is also still taking her Protonix 40 mg QD now.   Discussed plans with patient for ongoing care management follow up and provided patient with direct contact information for care management team; Advised patient, providing education and rationale, to monitor blood pressure daily and record, calling PCP for findings outside established parameters;  Reviewed scheduled/upcoming provider appointments including: Saw pcp recently. Knows to call for changes. No new upcoming appointments with the pcp. Advised patient to discuss changes in her blood pressures or questions/Concerns about her heart health with provider; Provided education on prescribed diet heart healthy/ADA diet. Review and education. Also discussed avoiding spicy foods that could aggravate the presence of ulcers ;  Discussed complications of poorly controlled blood pressure such as heart disease, stroke, circulatory complications, vision complications, kidney impairment, sexual dysfunction;  Screening for signs and symptoms of depression related to chronic disease state;  Assessed social determinant of health barriers;   Symptom Management: Take medications as prescribed   Attend all scheduled provider appointments Call provider office for new concerns or questions  call the Suicide and Crisis Lifeline: 988 call the [place] National Suicide Prevention Lifeline: (773) 455-9825 or TTY: 3643913574 TTY (249)094-5075) to talk to a trained counselor call 1-800-273-TALK (toll free, 24 hour hotline) if experiencing a Mental Health or Behavioral Health Crisis  check blood pressure weekly learn about high blood pressure call doctor for signs and symptoms of high blood pressure develop an action plan for high blood pressure keep all doctor appointments take  medications for blood pressure exactly as prescribed report new symptoms to your doctor  Follow Up Plan: Telephone follow up appointment with care management team member scheduled for: 11-06-2022 at 0900 am          Plan:Telephone follow up appointment with care management team member scheduled for:  11-06-2022 at 0900 am  Alto Denver RN, MSN, CCM RN Care Manager  Chronic Care Management Direct Number: [phone]

## 2022-10-09 NOTE — Patient Instructions (Signed)
Please call the care guide team at [phone] if you need to cancel or reschedule your appointment.   If you are experiencing a Mental Health or Behavioral Health Crisis or need someone to talk to, please call the Suicide and Crisis Lifeline: 988 call the [place] National Suicide Prevention Lifeline: (202)720-8919 or TTY: 563-380-9083 TTY 445-854-3146) to talk to a trained counselor call 1-800-273-TALK (toll free, 24 hour hotline) go to [facility] [address], [place] [phone])   Following is a copy of the CCM Program Consent:  CCM service includes personalized support from designated clinical staff supervised by the physician, including individualized plan of care and coordination with other care providers 24/7 contact phone numbers for assistance for urgent and routine care needs. Service will only be billed when office clinical staff spend 20 minutes or more in a month to coordinate care. Only one practitioner may furnish and bill the service in a calendar month. The patient may stop CCM services at amy time (effective at the end of the month) by phone call to the office staff. The patient will be responsible for cost sharing (co-pay) or up to 20% of the service fee (after annual deductible is met)  Following is a copy of your full provider care plan:   Goals Addressed             This Visit's Progress    CCM Expected Outcome:  Monitor, Self-Manage and Reduce Symptoms of Diabetes       Current Barriers:  Knowledge Deficits related to the need to check blood sugars on a regular basis, preventive care needs, and importance of following recommendations by the provider for effective management of DM and other chronic conditions Care Coordination needs related to resources the patient needs for senior housing so she can have a safe place to live without fear of being asked to move out in a patient with DM Chronic Disease Management support and  education needs related to effective management of DM Lacks caregiver support.  Corporate treasurer.  Lab Results  Component Value Date   HGBA1C 5.8 04/17/2022     Planned Interventions: Provided education to patient about basic DM disease process. Review and education. The patient has multiple chronic conditions impacting her care. Her DM are stable. Discussed changes to monitor for and when to call the provider.; Reviewed medications with patient and discussed importance of medication adherence. 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 Denies any swelling. States compliance with medications.The patient sees the pcp on a regular basis.       Reviewed prescribed diet with patient heart healthy/ADA diet. Education and support given. The patient states she is staying away for spicy foods. Is taking protonix and also has medications for nausea if she needs them. She says she has only had to take a couple of times. EGD is scheduled for  09-29-2022. The patient completed her EGD and then stayed with her sister x 1 week after her procedure. She is back home and doing well. She does want a riser for the toilet. One has been ordered by the pcp. The patient wanted details for getting the device. A message has been sent to staff asking for someone to reach out to the patient to give her requested information.  Counseled on importance of regular laboratory monitoring as prescribed. Has regular lab testing;  Discussed plans with patient for ongoing care management follow up and provided patient with direct contact information for care management team;      Provided patient with written educational materials related to hypo and hyperglycemia and importance of correct treatment. Denies any drops in her blood sugars or highs. Will continue to monitor;       Reviewed scheduled/upcoming provider appointments including:  09-02-2022 at 1040am;         Ongoing support and education from the SW. The patient is in her own apartment but she would like to find another apartment. She states she had a critter recently and this upset her. She states that she is paying 1000.00 a month and she should not have to deal with any critters. Will send a message to the SW and let her know she is interested in other places she could rent.   The patient is working on a regular basis with the SW. The SW is scheduled for follow up in early July with the patient. The patient still does not have a bed and she states she lost the number to [facility]. The RNCM gave the patient to [facility] today: [phone] and advised her to call and ask about help with getting a bed to use. The patient verbalized understanding. She states her niece got her a fan to put in her home and that is helping keep her living area cooler.  Review of patient status, including review of consultants reports, relevant laboratory and other test results, and medications completed;       Advised patient to discuss changes in her DM, Questions, and concerns with provider;      Screening for signs and symptoms of depression related to chronic disease state;        Assessed social determinant of health barriers;      The patient has had 2 falls recently due to dizziness. She states that once she fell out of the bathtub and once she fell when she was in the kitchen getting some water. She states she has a life alert system now and she is thankful for that. She is wanting a toilet riser and one has been ordered for her. Have alerted the office to let them know she needs to know the details for obtaining the device. Will continue to monitor for changes.   Symptom Management: Take medications as prescribed   Attend all scheduled provider appointments Call provider office for new concerns or questions  call the Suicide and Crisis Lifeline: 988 call the [place] National  Suicide Prevention Lifeline: 805-810-8218 or TTY: 662-540-3652 TTY 220-886-8065) to talk to a trained counselor call 1-800-273-TALK (toll free, 24 hour hotline) if experiencing a Mental Health or Behavioral Health Crisis  schedule appointment with eye doctor check feet daily for cuts, sores or redness trim toenails straight across wash and dry feet carefully every day wear comfortable, cotton socks wear comfortable, well-fitting shoes  Follow Up Plan: Telephone follow up appointment with care management team member scheduled for: 11-06-2022 at 0900 am       CCM Expected Outcome:  Monitor, Self-Manage, and Reduce Symptoms of Hypertension       Current Barriers:  Knowledge Deficits related to the benefits of taking blood pressures on a regular basis to monitor changes in blood pressures and decrease risk of heart attack and stroke Care Coordination needs related to educational needs for a patient with HTN and other chronic conditions and resources in the community to  help patient with housing needs  in a patient with HTN Chronic Disease Management support and education needs related to effective management of HTN Lacks caregiver support.  Needs stable housing, currently lives with her cousin but needs to find a place of her own. Asking for help with resources- 07-18-2022- Is in her own apartment now  BP Readings from Last 3 Encounters:  09/29/22 (!) 150/57  09/25/22 (!) 125/55  09/02/22 (!) 130/58     Planned Interventions: Evaluation of current treatment plan related to hypertension self management and patient's adherence to plan as established by provider.The patient is doing better with her blood pressures. The patient states she is doing well and is recovering well with her recent EGD. Denies any more issues with being dizzy.  Provided education to patient re: stroke prevention, s/s of heart attack and stroke. The patient has a history of stroke and the patient mindful of the impact of  uncontrolled blood pressures and risk of HA and stroke; Reviewed prescribed diet heart healthy/ADA diet- the patient states she is mindful of her dietary restrictions and she monitors her sodium and sugar intake. She also is monitoring for spicy foods now and knows she needs to watch her intake closely due to the recent hospitalization with finding ulcers present. The patient states today that she is doing "real good" because she "loves herself". The patient has a friend that has been helping with food right  now. She states she is good to her. She also is with the program [facility]. She is signed up for MOW in [facility] but is aware of the waiting list.  Reviewed medications with patient and discussed importance of compliance. The patient states that she does have her medications and she is taking her medications as directed. Has her medications and is compliant with medications. The patient is back on her hydralazine 25 mg BID, Entresto 49-51 mg BID, Amlodipine 5 mg QD and Carvedilol 6.25 mg BID. She is also still taking her Protonix 40 mg QD now.   Discussed plans with patient for ongoing care management follow up and provided patient with direct contact information for care management team; Advised patient, providing education and rationale, to monitor blood pressure daily and record, calling PCP for findings outside established parameters;  Reviewed scheduled/upcoming provider appointments including: Saw pcp recently. Knows to call for changes. No new upcoming appointments with the pcp. Advised patient to discuss changes in her blood pressures or questions/Concerns about her heart health with provider; Provided education on prescribed diet heart healthy/ADA diet. Review and education. Also discussed avoiding spicy foods that could aggravate the presence of ulcers ;  Discussed complications of poorly controlled blood pressure such as heart disease, stroke, circulatory complications, vision  complications, kidney impairment, sexual dysfunction;  Screening for signs and symptoms of depression related to chronic disease state;  Assessed social determinant of health barriers;   Symptom Management: Take medications as prescribed   Attend all scheduled provider appointments Call provider office for new concerns or questions  call the Suicide and Crisis Lifeline: 988 call the [place] National Suicide Prevention Lifeline: 334-102-3246 or TTY: 662-805-5116 TTY 585-860-3947) to talk to a trained counselor call 1-800-273-TALK (toll free, 24 hour hotline) if experiencing a Mental Health or Behavioral Health Crisis  check blood pressure weekly learn about high blood pressure call doctor for signs and symptoms of high blood pressure develop an action plan for high blood pressure keep all doctor appointments take medications for blood pressure exactly  as prescribed report new symptoms to your doctor  Follow Up Plan: Telephone follow up appointment with care management team member scheduled for: 11-06-2022 at 0900 am          The patient verbalized understanding of instructions, educational materials, and care plan provided today and DECLINED offer to receive copy of patient instructions, educational materials, and care plan.  Telephone follow up appointment with care management team member scheduled for: 11-06-2022 at 0900 am

## 2022-10-10 ENCOUNTER — Telehealth: Payer: Self-pay

## 2022-10-10 NOTE — Telephone Encounter (Signed)
-----   Message from [facility] sent at 10/10/2022  8:46 AM EDT ----- If you sent it in as DME via Epic it most likely went to [facility].  ----- Message ----- From: Eulis Foster, FNP Sent: 10/09/2022   9:20 PM EDT To: Bing Plume, RN; #  Let me know if I need to resend it. I apologize for the confusion. ----- Message ----- From: Marlowe Sax, RN Sent: 10/09/2022   9:49 AM EDT To: Eulis Foster, FNP; [name]; #  Hello, I spoke to the patient today and I see where the order was sent for the commode riser but could not pull up where it was sent to. Can someone reach out to the patient and let her know how to obtain since the order has been placed? She was asking about it today during the call.  Thank you, Pam ----- Message ----- From: Eulis Foster, FNP Sent: 10/07/2022   9:39 PM EDT To: Bing Plume, RN  No problem. Order placed ----- Message ----- From: [name] Sent: 10/07/2022   3:38 PM EDT To: Eulis Foster, FNP; Marlowe Sax, RN  Thank you, Oran Rein. We are unable to place that order. Can you or a clinic team member place orders for DME when you get a chance?  Thank you, Enrique Sack ----- Message ----- From: Eulis Foster, FNP Sent: 10/07/2022   3:08 PM EDT To: Bing Plume, RN  OK to proceed with order  Oran Rein ----- Message ----- From: [name] Sent: 10/07/2022  10:58 AM EDT To: Gerre Scull, NP; Marlowe Sax, RN  Dr. Norval Morton, sending an Lorain Childes pt is back at home from her sisters. She indicates it is very hard to get on/off commode due to it being so low. Wondering if you would consider ordering a 3 in 1 commode for her to use as a raised toilet seat?  Thank you! [name], BSW, CDP Social Worker, Certified Dementia Practitioner [facility] Care Management  Care Coordination [phone]

## 2022-10-10 NOTE — Telephone Encounter (Signed)
I called [facility] at [phone] and received verification that Commode Riser order was sent there. I called and notified patient that someone will be in contact with her to get this equipment.

## 2022-10-12 DIAGNOSIS — E119 Type 2 diabetes mellitus without complications: Secondary | ICD-10-CM

## 2022-10-12 DIAGNOSIS — I10 Essential (primary) hypertension: Secondary | ICD-10-CM

## 2022-10-21 ENCOUNTER — Ambulatory Visit: Payer: Self-pay

## 2022-10-21 NOTE — Patient Outreach (Signed)
  Care Coordination   Follow Up Visit Note   10/21/2022 Name: Lisa [name] MRN: [id number] DOB: 1950/06/18  Lisa [name] is a 72 y.o. year old female who sees [name], [name], NP for primary care. I spoke with  Lisa [name] by phone today.  What matters to the patients health and wellness today?  The patient would like to obtain a bed and raised toilet seat    Goals Addressed             This Visit's Progress    Care Coordination Activities   On track    Care Coordination Interventions: Discussed the patient has been in contact with [facility] regarding the need for a bed in her home - she is awaiting a response on next steps to determine if she is eligible Determined the patient has yet to receive a raised toilet seat in her home - she has spoken with [employer] who is awaiting shipment of the product. They will deliver to the patients home when it is received Confirmed patient continues to receive a monthly food box from [facility] which is delivered to the patients home Discussed plan for SW to follow up with the patient over the next 45 days; encouraged the patient to contact SW as needed        SDOH assessments and interventions completed:  No     Care Coordination Interventions:  Yes, provided   Interventions Today    Flowsheet Row Most Recent Value  Chronic Disease   Chronic disease during today's visit Hypertension (HTN), Congestive Heart Failure (CHF)  General Interventions   General Interventions Discussed/Reviewed General Interventions Reviewed, [employer], Horticulturist, commercial (DME)  Durable Medical Equipment (DME) Other  [Raised toilet seat]  Nutrition Interventions   Nutrition Discussed/Reviewed Nutrition Discussed        Follow up plan: Follow up call scheduled for 8/20    Encounter Outcome:  Pt. Visit Completed   [name], [name], CDP Social Worker, Certified Dementia Practitioner [facility] Care Management  Care  Coordination [phone]

## 2022-10-21 NOTE — Patient Instructions (Signed)
Visit Information  Thank you for taking time to visit with me today. Please don't hesitate to contact me if I can be of assistance to you.   Following are the goals we discussed today:  - Remain engaged with [facility] regarding resources to obtain a bed in your home - Follow up with [employer] as needed regarding raised toilet seat delivery - Continue participating in [facility] food delivery program -Contact your primary care provider as needed   Our next appointment is by telephone on 8/20 at 12:30  Please call the care guide team at [phone] if you need to cancel or reschedule your appointment.   If you are experiencing a Mental Health or Behavioral Health Crisis or need someone to talk to, please call 1-800-273-TALK (toll free, 24 hour hotline) go to [facility] [address] [place] [phone]) call 911  The patient verbalized understanding of instructions, educational materials, and care plan provided today and DECLINED offer to receive copy of patient instructions, educational materials, and care plan.   [name], BSW, CDP Social Worker, Certified Dementia Practitioner [facility] Care Management  Care Coordination [phone]

## 2022-11-03 NOTE — Patient Outreach (Signed)
  Care Coordination   Documentation  Note   11/03/2022 Name: Lisa [name] MRN: [id number] DOB: 31-Jul-1950  Lisa [name] is a 72 y.o. year old female who sees [name], [name], NP for primary care. I I reviewed the [id number] dashboard to note patients referral to [facility] has not yet been acted on. Voice message left for [facility], Director of Nutrition to follow up on the status of patients referral.   What matters to the patients health and wellness today?  SW did not speak with patient today.    SDOH assessments and interventions completed:  No     Care Coordination Interventions:  Yes, provided   Interventions Today    Flowsheet Row Most Recent Value  Chronic Disease   Chronic disease during today's visit Other  [Interest in Mobile Meals]  General Interventions   General Interventions Discussed/Reviewed Community Resources        Follow up plan:  SW will continue to follow    Encounter Outcome:  Pt. Visit Completed    [name], [name], CDP Social Worker, Certified Dementia Practitioner [facility] Care Management  Care Coordination [phone]

## 2022-11-06 ENCOUNTER — Telehealth: Payer: Medicare HMO

## 2022-11-06 ENCOUNTER — Ambulatory Visit (INDEPENDENT_AMBULATORY_CARE_PROVIDER_SITE_OTHER): Payer: Medicare HMO

## 2022-11-06 DIAGNOSIS — E119 Type 2 diabetes mellitus without complications: Secondary | ICD-10-CM

## 2022-11-06 DIAGNOSIS — I10 Essential (primary) hypertension: Secondary | ICD-10-CM

## 2022-11-06 DIAGNOSIS — R6889 Other general symptoms and signs: Secondary | ICD-10-CM | POA: Diagnosis not present

## 2022-11-06 DIAGNOSIS — R29898 Other symptoms and signs involving the musculoskeletal system: Secondary | ICD-10-CM

## 2022-11-06 DIAGNOSIS — Z9181 History of falling: Secondary | ICD-10-CM

## 2022-11-06 NOTE — Chronic Care Management (AMB) (Signed)
Chronic Care Management   CCM RN Visit Note  11/06/2022 Name: Lisa [name] MRN: [id number] DOB: 1950/12/01  Subjective: Lisa [name] is a 72 y.o. year old female who is a primary care patient of [name], [name], NP. The patient was referred to the Chronic Care Management team for assistance with care management needs subsequent to provider initiation of CCM services and plan of care.    Today's Visit:  Engaged with patient by telephone for follow up visit.     SDOH Interventions Today    Flowsheet Row Most Recent Value  SDOH Interventions   Health Literacy Interventions Other (Comment)  [low literacy, the team works with the patient on a regular]         Goals Addressed             This Visit's Progress    CCM Expected Outcome:  Monitor, Self-Manage and Reduce Symptoms of Diabetes       Current Barriers:  Knowledge Deficits related to the need to check blood sugars on a regular basis, preventive care needs, and importance of following recommendations by the provider for effective management of DM and other chronic conditions Care Coordination needs related to resources the patient needs for senior housing so she can have a safe place to live without fear of being asked to move out in a patient with DM Chronic Disease Management support and education needs related to effective management of DM Lacks caregiver support.  Corporate treasurer.  Lab Results  Component Value Date   HGBA1C 5.8 04/17/2022     Planned Interventions: Provided education to patient about basic DM disease process. Review and education. The patient has multiple chronic conditions impacting her care. Her DM are stable. Discussed changes to monitor for and when to call the provider.; Reviewed medications with patient and discussed importance of medication adherence. 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  Denies any swelling. States compliance with medications.The patient sees the pcp on a regular basis, upcoming appointment on 11-21-2022.      Reviewed prescribed diet with patient heart healthy/ADA diet. Education and support given. The patient states she is staying away for spicy foods. Is taking protonix and also has medications for nausea if she needs them. She does want a riser for the toilet she says this is on order and she says they will deliver when it comes in. Counseled on importance of regular laboratory monitoring as prescribed. Has regular lab testing;        Discussed plans with patient for ongoing care management follow up and provided patient with direct contact information for care management team;      Provided patient with written educational materials related to hypo and hyperglycemia and importance of correct treatment. Denies any drops in her blood sugars or highs. Will continue to monitor;       Reviewed scheduled/upcoming provider appointments including: 11-21-2022 at 1040am;         Ongoing support and education from the SW. The patient is working on a regular basis with the SW. The patient states she is supposed to get her bed on 11-25-2022 and she is excited about having her own bed to sleep in. She has the number to [facility] today: [phone]. The patient verbalized understanding. She states her niece got her a fan to put in her home and that is helping keep her  living area cooler. She states that she is so much happier being on her own and she is doing good. She states her husband would be proud of her.  Review of patient status, including review of consultants reports, relevant laboratory and other test results, and medications completed;       Advised patient to discuss changes in her DM, Questions, and concerns with provider;      Screening for signs and symptoms of depression related to chronic disease state;        Assessed social determinant of health barriers;       The patient has had 2 falls recently due to dizziness. She states that once she fell out of the bathtub and once she fell when she was in the kitchen getting some water. She states she has a life alert system now and she is thankful for that. She continues to work with the SW and the [facility]. Will continue to monitor for new needs and changes. The patient is appreciative of all the help she gets and the team calling and talking to her.   Symptom Management: Take medications as prescribed   Attend all scheduled provider appointments Call provider office for new concerns or questions  call the Suicide and Crisis Lifeline: 988 call the [place] National Suicide Prevention Lifeline: (906)412-0269 or TTY: 623-075-0654 TTY 718-859-5631) to talk to a trained counselor call 1-800-273-TALK (toll free, 24 hour hotline) if experiencing a Mental Health or Behavioral Health Crisis  schedule appointment with eye doctor check feet daily for cuts, sores or redness trim toenails straight across wash and dry feet carefully every day wear comfortable, cotton socks wear comfortable, well-fitting shoes  Follow Up Plan: Telephone follow up appointment with care management team member scheduled for: 12-10-2022 at 0900 am       CCM Expected Outcome:  Monitor, Self-Manage, and Reduce Symptoms of Hypertension       Current Barriers:  Knowledge Deficits related to the benefits of taking blood pressures on a regular basis to monitor changes in blood pressures and decrease risk of heart attack and stroke Care Coordination needs related to educational needs for a patient with HTN and other chronic conditions and resources in the community to help patient with housing needs  in a patient with HTN Chronic Disease Management support and education needs related to effective management of HTN Lacks caregiver support.  Needs stable housing, currently lives with her cousin but needs to find a place of her own. Asking for help with  resources- 07-18-2022- Is in her own apartment now  BP Readings from Last 3 Encounters:  09/29/22 (!) 150/57  09/25/22 (!) 125/55  09/02/22 (!) 130/58     Planned Interventions: Evaluation of current treatment plan related to hypertension self management and patient's adherence to plan as established by provider.The patient is doing better with her blood pressures. The patient states that she feels she was getting dizzy due to  her taking too many medications. She states that she has not had anymore dizziness since one of her medications she does not take anymore. Encouraged her to get a follow up appointment with her cardiologist after she sees her pcp in two weeks.  Provided education to patient re: stroke prevention, s/s of heart attack and stroke. The patient has a history of stroke and the patient mindful of the impact of uncontrolled blood pressures and risk of HA and stroke; Reviewed prescribed diet heart healthy/ADA diet- the patient states she is mindful of her  dietary restrictions and she monitors her sodium and sugar intake. She also is monitoring for spicy foods now and knows she needs to watch her intake closely due to the recent hospitalization with finding ulcers present. The patient states today that she is doing "real good" because she "loves herself". She is with the program [facility] and gets a box of food a month from them.  She is signed up for [facility] in [facility] but is aware of the waiting list.  Reviewed medications with patient and discussed importance of compliance. The patient states that she does have her medications and she is taking her medications as directed. Has her medications and is compliant with medications. The patient is back on her hydralazine 25 mg BID, Entresto 49-51 mg BID, Amlodipine 5 mg QD and Carvedilol 6.25 mg BID. She is also still taking her Protonix 40 mg QD now.  Denies any medication needs at this time.  Discussed plans with patient for  ongoing care management follow up and provided patient with direct contact information for care management team; Advised patient, providing education and rationale, to monitor blood pressure daily and record, calling PCP for findings outside established parameters;  Reviewed scheduled/upcoming provider appointments including: Appointment on 11-21-2022 with the pcp. Knows to call for changes. Encouraged her to get a follow up with her cardiologist after she completes her other appointments Advised patient to discuss changes in her blood pressures or questions/Concerns about her heart health with provider; Provided education on prescribed diet heart healthy/ADA diet. Review and education. Also discussed avoiding spicy foods that could aggravate the presence of ulcers ;  Discussed complications of poorly controlled blood pressure such as heart disease, stroke, circulatory complications, vision complications, kidney impairment, sexual dysfunction;  Screening for signs and symptoms of depression related to chronic disease state;  Assessed social determinant of health barriers;   Symptom Management: Take medications as prescribed   Attend all scheduled provider appointments Call provider office for new concerns or questions  call the Suicide and Crisis Lifeline: 988 call the [place] National Suicide Prevention Lifeline: (870) 270-3450 or TTY: 8101306060 TTY 973-768-5130) to talk to a trained counselor call 1-800-273-TALK (toll free, 24 hour hotline) if experiencing a Mental Health or Behavioral Health Crisis  check blood pressure weekly learn about high blood pressure call doctor for signs and symptoms of high blood pressure develop an action plan for high blood pressure keep all doctor appointments take medications for blood pressure exactly as prescribed report new symptoms to your doctor  Follow Up Plan: Telephone follow up appointment with care management team member scheduled for: 12-10-2022 at  0900 am          Plan:Telephone follow up appointment with care management team member scheduled for:  12-10-2022 at 0900 am  Alto Denver RN, MSN, CCM RN Care Manager  Chronic Care Management Direct Number: [phone]

## 2022-11-06 NOTE — Patient Instructions (Signed)
Please call the care guide team at [phone] if you need to cancel or reschedule your appointment.   If you are experiencing a Mental Health or Behavioral Health Crisis or need someone to talk to, please call the Suicide and Crisis Lifeline: 988 call the [place] National Suicide Prevention Lifeline: 870 065 6607 or TTY: 302-659-0171 TTY 608-732-1486) to talk to a trained counselor call 1-800-273-TALK (toll free, 24 hour hotline) go to [facility] [address] [place] [phone])   Following is a copy of the CCM Program Consent:  CCM service includes personalized support from designated clinical staff supervised by the physician, including individualized plan of care and coordination with other care providers 24/7 contact phone numbers for assistance for urgent and routine care needs. Service will only be billed when office clinical staff spend 20 minutes or more in a month to coordinate care. Only one practitioner may furnish and bill the service in a calendar month. The patient may stop CCM services at amy time (effective at the end of the month) by phone call to the office staff. The patient will be responsible for cost sharing (co-pay) or up to 20% of the service fee (after annual deductible is met)  Following is a copy of your full provider care plan:   Goals Addressed             This Visit's Progress    CCM Expected Outcome:  Monitor, Self-Manage and Reduce Symptoms of Diabetes       Current Barriers:  Knowledge Deficits related to the need to check blood sugars on a regular basis, preventive care needs, and importance of following recommendations by the provider for effective management of DM and other chronic conditions Care Coordination needs related to resources the patient needs for senior housing so she can have a safe place to live without fear of being asked to move out in a patient with DM Chronic Disease Management support and  education needs related to effective management of DM Lacks caregiver support.  Corporate treasurer.  Lab Results  Component Value Date   HGBA1C 5.8 04/17/2022     Planned Interventions: Provided education to patient about basic DM disease process. Review and education. The patient has multiple chronic conditions impacting her care. Her DM are stable. Discussed changes to monitor for and when to call the provider.; Reviewed medications with patient and discussed importance of medication adherence. 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 Denies any swelling. States compliance with medications.The patient sees the pcp on a regular basis, upcoming appointment on 11-21-2022.      Reviewed prescribed diet with patient heart healthy/ADA diet. Education and support given. The patient states she is staying away for spicy foods. Is taking protonix and also has medications for nausea if she needs them. She does want a riser for the toilet she says this is on order and she says they will deliver when it comes in. Counseled on importance of regular laboratory monitoring as prescribed. Has regular lab testing;        Discussed plans with patient for ongoing care management follow up and provided patient with direct contact information for care management team;      Provided patient with written educational materials related to hypo and hyperglycemia and importance of correct treatment. Denies any drops in her blood sugars or highs. Will continue to monitor;  Reviewed scheduled/upcoming provider appointments including: 11-21-2022 at 1040am;         Ongoing support and education from the SW. The patient is working on a regular basis with the SW. The patient states she is supposed to get her bed on 11-25-2022 and she is excited about having her own bed to sleep in. She has the number to [facility] today: [phone].  The patient verbalized understanding. She states her niece got her a fan to put in her home and that is helping keep her living area cooler. She states that she is so much happier being on her own and she is doing good. She states her husband would be proud of her.  Review of patient status, including review of consultants reports, relevant laboratory and other test results, and medications completed;       Advised patient to discuss changes in her DM, Questions, and concerns with provider;      Screening for signs and symptoms of depression related to chronic disease state;        Assessed social determinant of health barriers;      The patient has had 2 falls recently due to dizziness. She states that once she fell out of the bathtub and once she fell when she was in the kitchen getting some water. She states she has a life alert system now and she is thankful for that. She continues to work with the SW and the [facility]. Will continue to monitor for new needs and changes. The patient is appreciative of all the help she gets and the team calling and talking to her.   Symptom Management: Take medications as prescribed   Attend all scheduled provider appointments Call provider office for new concerns or questions  call the Suicide and Crisis Lifeline: 988 call the [place] National Suicide Prevention Lifeline: 954-840-2234 or TTY: 732-238-5138 TTY (825) 255-3718) to talk to a trained counselor call 1-800-273-TALK (toll free, 24 hour hotline) if experiencing a Mental Health or Behavioral Health Crisis  schedule appointment with eye doctor check feet daily for cuts, sores or redness trim toenails straight across wash and dry feet carefully every day wear comfortable, cotton socks wear comfortable, well-fitting shoes  Follow Up Plan: Telephone follow up appointment with care management team member scheduled for: 12-10-2022 at 0900 am       CCM Expected Outcome:  Monitor, Self-Manage, and Reduce Symptoms  of Hypertension       Current Barriers:  Knowledge Deficits related to the benefits of taking blood pressures on a regular basis to monitor changes in blood pressures and decrease risk of heart attack and stroke Care Coordination needs related to educational needs for a patient with HTN and other chronic conditions and resources in the community to help patient with housing needs  in a patient with HTN Chronic Disease Management support and education needs related to effective management of HTN Lacks caregiver support.  Needs stable housing, currently lives with her cousin but needs to find a place of her own. Asking for help with resources- 07-18-2022- Is in her own apartment now  BP Readings from Last 3 Encounters:  09/29/22 (!) 150/57  09/25/22 (!) 125/55  09/02/22 (!) 130/58     Planned Interventions: Evaluation of current treatment plan related to hypertension self management and patient's adherence to plan as established by provider.The patient is doing better with her blood pressures. The patient states that she feels she was getting dizzy due to  her taking too many  medications. She states that she has not had anymore dizziness since one of her medications she does not take anymore. Encouraged her to get a follow up appointment with her cardiologist after she sees her pcp in two weeks.  Provided education to patient re: stroke prevention, s/s of heart attack and stroke. The patient has a history of stroke and the patient mindful of the impact of uncontrolled blood pressures and risk of HA and stroke; Reviewed prescribed diet heart healthy/ADA diet- the patient states she is mindful of her dietary restrictions and she monitors her sodium and sugar intake. She also is monitoring for spicy foods now and knows she needs to watch her intake closely due to the recent hospitalization with finding ulcers present. The patient states today that she is doing "real good" because she "loves herself". She  is with the program [facility] and gets a box of food a month from them.  She is signed up for [facility] but is aware of the waiting list.  Reviewed medications with patient and discussed importance of compliance. The patient states that she does have her medications and she is taking her medications as directed. Has her medications and is compliant with medications. The patient is back on her hydralazine 25 mg BID, Entresto 49-51 mg BID, Amlodipine 5 mg QD and Carvedilol 6.25 mg BID. She is also still taking her Protonix 40 mg QD now.  Denies any medication needs at this time.  Discussed plans with patient for ongoing care management follow up and provided patient with direct contact information for care management team; Advised patient, providing education and rationale, to monitor blood pressure daily and record, calling PCP for findings outside established parameters;  Reviewed scheduled/upcoming provider appointments including: Appointment on 11-21-2022 with the pcp. Knows to call for changes. Encouraged her to get a follow up with her cardiologist after she completes her other appointments Advised patient to discuss changes in her blood pressures or questions/Concerns about her heart health with provider; Provided education on prescribed diet heart healthy/ADA diet. Review and education. Also discussed avoiding spicy foods that could aggravate the presence of ulcers ;  Discussed complications of poorly controlled blood pressure such as heart disease, stroke, circulatory complications, vision complications, kidney impairment, sexual dysfunction;  Screening for signs and symptoms of depression related to chronic disease state;  Assessed social determinant of health barriers;   Symptom Management: Take medications as prescribed   Attend all scheduled provider appointments Call provider office for new concerns or questions  call the Suicide and Crisis Lifeline: 988 call the [place]  National Suicide Prevention Lifeline: 705-864-9827 or TTY: 386-354-2692 TTY 780-087-6660) to talk to a trained counselor call 1-800-273-TALK (toll free, 24 hour hotline) if experiencing a Mental Health or Behavioral Health Crisis  check blood pressure weekly learn about high blood pressure call doctor for signs and symptoms of high blood pressure develop an action plan for high blood pressure keep all doctor appointments take medications for blood pressure exactly as prescribed report new symptoms to your doctor  Follow Up Plan: Telephone follow up appointment with care management team member scheduled for: 12-10-2022 at 0900 am          The patient verbalized understanding of instructions, educational materials, and care plan provided today and DECLINED offer to receive copy of patient instructions, educational materials, and care plan.  Telephone follow up appointment with care management team member scheduled for: 12-10-2022 at 0900 am

## 2022-11-07 NOTE — Patient Outreach (Signed)
  Care Coordination   Documentation  Note   11/07/2022 Name: Keshauna Vicencio MRN: [id number] DOB: 01/23/1951  Kristi Paulette Housey is a 72 y.o. year old female who sees [name], [name], NP for primary care. I collaborated with Publishing rights manager to follow up on the status of patients meals on wheels referral which was placed via [employer] platform as it has not yet been acted on.  What matters to the patients health and wellness today?  SW did not speak with the patient/    SDOH assessments and interventions completed:  No     Care Coordination Interventions:  Yes, provided   Interventions Today    Flowsheet Row Most Recent Value  Chronic Disease   Chronic disease during today's visit Other  [interest in meals on wheels]  General Interventions   General Interventions Discussed/Reviewed [facility], Communication with  [Outbound call placed to [employer] requesting confirmation patients referral to meals on wheels has been received. Referral previously placed on NCCARE360 platform has yet to be acted on]       Follow up plan:  SW will follow up with [facility] over the next week to confirm patients referral has been received.    Encounter Outcome:  Pt. Visit Completed   [name], BSW, CDP Social Worker, Certified Dementia Practitioner [facility] Care Management  Care Coordination [phone]

## 2022-11-11 NOTE — Patient Outreach (Signed)
  Care Coordination   Documentation  Note   11/11/2022 Name: Cheris Lammert MRN: [id number] DOB: 1950-09-03  Natyia Rauf Macaraeg is a 72 y.o. year old female who sees [name], [name], NP for primary care. I collaborated with [facility], Nutrition Director with [employer] [place] to follow up on status of patients referral placed via NCCARE360 for meals on wheels.  SDOH assessments and interventions completed:  No     Care Coordination Interventions:  Yes, provided   Interventions Today    Flowsheet Row Most Recent Value  Chronic Disease   Chronic disease during today's visit Other  [interest in receiving mobile meals]  General Interventions   General Interventions Discussed/Reviewed Communication with, [facility]  [Inbound call from [employer], Interior and spatial designer of Nutrition with [employer]. Discussed Ms. [place] is actively working to obtain licensure to access referrals via [id number]. Wat list remains up to 1 year]        Follow up plan:  SW will continue to follow.    Encounter Outcome:  Pt. Visit Completed   [name], BSW, CDP Social Worker, Certified Dementia Practitioner [facility] Care Management  Care Coordination [phone]

## 2022-11-12 DIAGNOSIS — E119 Type 2 diabetes mellitus without complications: Secondary | ICD-10-CM

## 2022-11-12 DIAGNOSIS — I10 Essential (primary) hypertension: Secondary | ICD-10-CM

## 2022-11-13 ENCOUNTER — Ambulatory Visit: Payer: Medicare PPO

## 2022-11-14 ENCOUNTER — Other Ambulatory Visit: Payer: Self-pay | Admitting: Nurse Practitioner

## 2022-11-14 NOTE — Telephone Encounter (Signed)
Requesting: PANTOPRAZOLE SODIUM 40 MG Tablet Delayed Release  Last Visit: 09/02/2022 Next Visit: 11/21/2022 Last Refill: 06/19/2022  Please Advise

## 2022-11-21 ENCOUNTER — Encounter: Payer: Self-pay | Admitting: Nurse Practitioner

## 2022-11-21 ENCOUNTER — Ambulatory Visit: Payer: Medicare HMO | Admitting: Nurse Practitioner

## 2022-11-21 VITALS — BP 200/75 | HR 74 | Temp 97.6°F | Ht 65.0 in | Wt 139.6 lb

## 2022-11-21 DIAGNOSIS — E1140 Type 2 diabetes mellitus with diabetic neuropathy, unspecified: Secondary | ICD-10-CM

## 2022-11-21 DIAGNOSIS — E785 Hyperlipidemia, unspecified: Secondary | ICD-10-CM | POA: Diagnosis not present

## 2022-11-21 DIAGNOSIS — R200 Anesthesia of skin: Secondary | ICD-10-CM | POA: Diagnosis not present

## 2022-11-21 DIAGNOSIS — R6889 Other general symptoms and signs: Secondary | ICD-10-CM | POA: Diagnosis not present

## 2022-11-21 DIAGNOSIS — E1169 Type 2 diabetes mellitus with other specified complication: Secondary | ICD-10-CM | POA: Diagnosis not present

## 2022-11-21 DIAGNOSIS — Z23 Encounter for immunization: Secondary | ICD-10-CM | POA: Diagnosis not present

## 2022-11-21 DIAGNOSIS — R202 Paresthesia of skin: Secondary | ICD-10-CM

## 2022-11-21 DIAGNOSIS — I5042 Chronic combined systolic (congestive) and diastolic (congestive) heart failure: Secondary | ICD-10-CM

## 2022-11-21 DIAGNOSIS — F320 Major depressive disorder, single episode, mild: Secondary | ICD-10-CM | POA: Diagnosis not present

## 2022-11-21 DIAGNOSIS — I10 Essential (primary) hypertension: Secondary | ICD-10-CM | POA: Diagnosis not present

## 2022-11-21 DIAGNOSIS — K279 Peptic ulcer, site unspecified, unspecified as acute or chronic, without hemorrhage or perforation: Secondary | ICD-10-CM | POA: Diagnosis not present

## 2022-11-21 LAB — COMPREHENSIVE METABOLIC PANEL
ALT: 11 U/L (ref 0–35)
AST: 18 U/L (ref 0–37)
Albumin: 4.5 g/dL (ref 3.5–5.2)
Alkaline Phosphatase: 94 U/L (ref 39–117)
BUN: 13 mg/dL (ref 6–23)
CO2: 28 mEq/L (ref 19–32)
Calcium: 9.4 mg/dL (ref 8.4–10.5)
Chloride: 101 mEq/L (ref 96–112)
Creatinine, Ser: 0.94 mg/dL (ref 0.40–1.20)
GFR: 61.14 mL/min (ref >60.00)
Glucose, Bld: 116 mg/dL — ABNORMAL HIGH (ref 70–99)
Potassium: 4.2 mEq/L (ref 3.5–5.1)
Sodium: 138 mEq/L (ref 135–145)
Total Bilirubin: 0.4 mg/dL (ref 0.2–1.2)
Total Protein: 7.9 g/dL (ref 6.0–8.3)

## 2022-11-21 LAB — CBC WITH DIFFERENTIAL/PLATELET
Basophils Absolute: 0 10*3/uL (ref 0.0–0.1)
Basophils Relative: 0.5 % (ref 0.0–3.0)
Eosinophils Absolute: 0.1 10*3/uL (ref 0.0–0.7)
Eosinophils Relative: 2.5 % (ref 0.0–5.0)
HCT: 35.8 % — ABNORMAL LOW (ref 36.0–46.0)
Hemoglobin: 11.5 g/dL — ABNORMAL LOW (ref 12.0–15.0)
Lymphocytes Relative: 17.4 % (ref 12.0–46.0)
Lymphs Abs: 1 10*3/uL (ref 0.7–4.0)
MCHC: 32.1 g/dL (ref 30.0–36.0)
MCV: 91.5 fl (ref 78.0–100.0)
Monocytes Absolute: 0.7 10*3/uL (ref 0.1–1.0)
Monocytes Relative: 11.4 % (ref 3.0–12.0)
Neutro Abs: 3.9 10*3/uL (ref 1.4–7.7)
Neutrophils Relative %: 68.2 % (ref 43.0–77.0)
Platelets: 237 10*3/uL (ref 150.0–400.0)
RBC: 3.92 Mil/uL (ref 3.87–5.11)
RDW: 15.3 % (ref 11.5–15.5)
WBC: 5.7 10*3/uL (ref 4.0–10.5)

## 2022-11-21 LAB — LIPID PANEL
Cholesterol: 162 mg/dL (ref 0–200)
HDL: 69.4 mg/dL (ref >39.00)
LDL Cholesterol: 75 mg/dL (ref 0–99)
NonHDL: 92.86
Total CHOL/HDL Ratio: 2
Triglycerides: 89 mg/dL (ref 0.0–149.0)
VLDL: 17.8 mg/dL (ref 0.0–40.0)

## 2022-11-21 LAB — HEMOGLOBIN A1C: Hgb A1c MFr Bld: 6.4 % (ref 4.6–6.5)

## 2022-11-21 MED ORDER — DULOXETINE HCL 30 MG PO CPEP: 30.0000 mg | ORAL_CAPSULE | Freq: Every day | ORAL | 0 refills | Status: DC

## 2022-11-21 NOTE — Assessment & Plan Note (Signed)
She had a follow-up endoscopy which showed resolution of ulcers and H. pylori.  Continue taking Protonix 40 mg daily.

## 2022-11-21 NOTE — Assessment & Plan Note (Signed)
Chronic, ongoing.  She states Lyrica did not help her symptoms.  Will switch her Celexa to duloxetine 30 mg daily.  Follow-up in 4 to 6 weeks.

## 2022-11-21 NOTE — Assessment & Plan Note (Signed)
Chronic, stable.  She has stopped the metformin and noticed that her dizziness has resolved.  Will continue holding off on the metformin.  Check A1c today.  She is also due for her pneumonia vaccine, Prevnar 20 given today.  She is up-to-date on her foot exam.  She is going to call to schedule an eye exam.

## 2022-11-21 NOTE — Assessment & Plan Note (Signed)
Chronic, stable.  Her symptoms are well-controlled on the Celexa, however she is still having a lot of neuropathy in her hands.  Will switch her Celexa to duloxetine 30 mg daily.  Follow-up in 4 weeks or sooner if depression worsens.

## 2022-11-21 NOTE — Assessment & Plan Note (Signed)
Chronic, stable.  Continue atorvastatin 40 mg daily.  Check lipid panel today.

## 2022-11-21 NOTE — Assessment & Plan Note (Addendum)
Chronic, ongoing.  BP today is 200/75.  She states that she did not take her medications yet today.  She has been checking her blood pressure at home and last week it was 135/60.  Encouraged her to take her medications when she gets home.  Continue taking amlodipine 5 mg daily, carvedilol 12.5 mg twice daily, Entresto 49-51 mg twice daily, and triamterene hydrochlorothiazide 37.5-25 mg daily, and hydralazine 25 mg twice daily.  Also encouraged her to get a new blood pressure cuff and check her blood pressure a few times over the weekend and call on Monday with the results.  Check CMP, CBC today.  Follow-up in 4 weeks.

## 2022-11-21 NOTE — Assessment & Plan Note (Signed)
Chronic, stable.  She is euvolemic on exam today.  She states that she needs to reach out to her cardiologist to reschedule an appointment.  For now continue carvedilol 12.5 mg twice daily, Plavix 75 mg daily, hydralazine 25 mg twice daily, Entresto 49-51 mg twice daily, and triamterene hydrochlorothiazide 37.5-25 mg daily.

## 2022-11-21 NOTE — Progress Notes (Signed)
Established Patient Office Visit  Subjective   Patient ID: Lisa [name], female    DOB: October 21, 1950  Age: 72 y.o. MRN: [id number]  Chief Complaint  Patient presents with   Follow-up    F/u after having colonoscopy done.       HPI  Lisa [name] is here to follow-up on diabetes, hypertension, hyperlipidemia, and peptic ulcer disease.   She states that she is doing well.  She recently got an apartment and is excited to have her own place again.  She states that she had a endoscopy done a few months ago and found out that the ulcers have healed and she tested negative for H. pylori.  She has not been having any abdominal pain.  She is taking Protonix 40 mg daily.  She has been checking her blood pressure at home up until last week when her blood pressure cuff broke.  She states that her most recent blood pressure was 135/60.  She has been taking her medications as prescribed however she did not take them this morning.  She denies chest pain and shortness of breath.  She states that she has not been checking her blood sugars at home.  She stopped the metformin and she is not having dizziness anymore.  She is still having some neuropathy in her hands and feet.  She states the Lyrica did not really help with this.    ROS See pertinent positives and negatives per HPI.    Objective:     BP (!) 200/75 (BP Location: Right Arm, Cuff Size: Normal)   Pulse 74   Temp 97.6 F (36.4 C) (Temporal)   Ht 5\' 5"  (1.651 m)   Wt 139 lb 9.6 oz (63.3 kg)   SpO2 98%   BMI 23.23 kg/m    Physical Exam Vitals and nursing note reviewed.  Constitutional:      General: She is not in acute distress.    Appearance: Normal appearance.  HENT:     Head: Normocephalic.  Eyes:     Conjunctiva/sclera: Conjunctivae normal.  Cardiovascular:     Rate and Rhythm: Normal rate and regular rhythm.     Pulses: Normal pulses.     Heart sounds: Normal heart sounds.  Pulmonary:     Effort: Pulmonary  effort is normal.     Breath sounds: Normal breath sounds.  Musculoskeletal:     Cervical back: Normal range of motion.  Skin:    General: Skin is warm.  Neurological:     General: No focal deficit present.     Mental Status: She is alert and oriented to person, place, and time.  Psychiatric:        Mood and Affect: Mood normal.        Behavior: Behavior normal.        Thought Content: Thought content normal.        Judgment: Judgment normal.      Assessment & Plan:   Problem List Items Addressed This Visit       Cardiovascular and Mediastinum   Essential hypertension - Primary    Chronic, ongoing.  BP today is 200/75.  She states that she did not take her medications yet today.  She has been checking her blood pressure at home and last week it was 135/60.  Encouraged her to take her medications when she gets home.  Continue taking amlodipine 5 mg daily, carvedilol 12.5 mg twice daily, Entresto 49-51 mg twice daily, and triamterene hydrochlorothiazide  37.5-25 mg daily, and hydralazine 25 mg twice daily.  Also encouraged her to get a new blood pressure cuff and check her blood pressure a few times over the weekend and call on Monday with the results.  Check CMP, CBC today.  Follow-up in 4 weeks.      Relevant Medications   hydrALAZINE (APRESOLINE) 25 MG tablet   amLODipine (NORVASC) 5 MG tablet   Other Relevant Orders   CBC with Differential/Platelet (Completed)   Comprehensive metabolic panel (Completed)   Chronic combined systolic and diastolic heart failure (HCC)    Chronic, stable.  She is euvolemic on exam today.  She states that she needs to reach out to her cardiologist to reschedule an appointment.  For now continue carvedilol 12.5 mg twice daily, Plavix 75 mg daily, hydralazine 25 mg twice daily, Entresto 49-51 mg twice daily, and triamterene hydrochlorothiazide 37.5-25 mg daily.      Relevant Medications   hydrALAZINE (APRESOLINE) 25 MG tablet   amLODipine (NORVASC) 5  MG tablet   Other Relevant Orders   CBC with Differential/Platelet (Completed)   Comprehensive metabolic panel (Completed)     Digestive   PUD (peptic ulcer disease)    She had a follow-up endoscopy which showed resolution of ulcers and H. pylori.  Continue taking Protonix 40 mg daily.        Endocrine   Hyperlipidemia associated with type 2 diabetes mellitus (HCC)    Chronic, stable.  Continue atorvastatin 40 mg daily.  Check lipid panel today.      Relevant Medications   hydrALAZINE (APRESOLINE) 25 MG tablet   amLODipine (NORVASC) 5 MG tablet   Other Relevant Orders   Lipid panel (Completed)   Controlled type 2 diabetes with neuropathy (HCC)    Chronic, stable.  She has stopped the metformin and noticed that her dizziness has resolved.  Will continue holding off on the metformin.  Check A1c today.  She is also due for her pneumonia vaccine, Prevnar 20 given today.  She is up-to-date on her foot exam.  She is going to call to schedule an eye exam.      Relevant Orders   CBC with Differential/Platelet (Completed)   Comprehensive metabolic panel (Completed)   Hemoglobin A1c (Completed)   Lipid panel (Completed)     Other   Numbness and tingling in both hands    Chronic, ongoing.  She states Lyrica did not help her symptoms.  Will switch her Celexa to duloxetine 30 mg daily.  Follow-up in 4 to 6 weeks.      Depression, major, single episode, mild (HCC)    Chronic, stable.  Her symptoms are well-controlled on the Celexa, however she is still having a lot of neuropathy in her hands.  Will switch her Celexa to duloxetine 30 mg daily.  Follow-up in 4 weeks or sooner if depression worsens.      Relevant Medications   DULoxetine (CYMBALTA) 30 MG capsule   Other Visit Diagnoses     Immunization due       Prevnar 20 given today   Relevant Orders   Pneumococcal conjugate vaccine 20-valent (Completed)       Return in about 6 weeks (around 01/02/2023) for HTN.    Gerre Scull, NP

## 2022-11-21 NOTE — Patient Instructions (Signed)
It was great to see you!  We are checking your labs today and will let you know the results via mychart/phone.   Stop the celexa and switch to duloxetine 1 capsule daily. This will hopefully help with your mood and your hand and foot pain.   Get a new blood pressure cuff when able. Call me once you do and you check your blood pressure at home and tell the nurse what your blood pressure is.   Let's follow-up in 6 weeks, sooner if you have concerns.  If a referral was placed today, you will be contacted for an appointment. Please note that routine referrals can sometimes take up to 3-4 weeks to process. Please call our office if you haven't heard anything after this time frame.  Take care,  Rodman Pickle, NP

## 2022-11-25 ENCOUNTER — Other Ambulatory Visit: Payer: Medicare PPO

## 2022-12-02 ENCOUNTER — Telehealth: Payer: Self-pay

## 2022-12-02 NOTE — Patient Outreach (Signed)
  Care Coordination   12/02/2022 Name: Tamantha Sedberry MRN: [id number] DOB: Nov 07, 1950   Care Coordination Outreach Attempts:  An unsuccessful telephone outreach was attempted for a scheduled appointment today.  Follow Up Plan:  Additional outreach attempts will be made to offer the patient care coordination information and services.   Encounter Outcome:  No Answer   Care Coordination Interventions:  No, not indicated    [name], BSW, CDP Social Worker, Certified Dementia Practitioner [facility] Care Management  Care Coordination [phone]

## 2022-12-08 ENCOUNTER — Telehealth: Payer: Self-pay

## 2022-12-08 ENCOUNTER — Ambulatory Visit: Payer: Self-pay

## 2022-12-08 ENCOUNTER — Other Ambulatory Visit: Payer: Self-pay | Admitting: Nurse Practitioner

## 2022-12-08 DIAGNOSIS — Z8673 Personal history of transient ischemic attack (TIA), and cerebral infarction without residual deficits: Secondary | ICD-10-CM

## 2022-12-08 DIAGNOSIS — R531 Weakness: Secondary | ICD-10-CM

## 2022-12-08 NOTE — Patient Outreach (Signed)
  Care Coordination   12/08/2022 Name: Lisa [name] MRN: [id number] DOB: Feb 20, 1951   Care Coordination Outreach Attempts:  A second unsuccessful outreach was attempted today to offer the patient with information about available care coordination services.  Follow Up Plan:  Additional outreach attempts will be made to offer the patient care coordination information and services.   Encounter Outcome:  No Answer   Care Coordination Interventions:  No, not indicated    [name], BSW, CDP Social Worker, Certified Dementia Practitioner [facility] Care Management  Care Coordination [phone]

## 2022-12-08 NOTE — Patient Outreach (Signed)
  Care Coordination   Follow Up Visit Note   12/08/2022 Name: Lisa [name] MRN: [id number] DOB: [birth year]  Lisa [name] is [name] 72 y.o. year old female who sees [name], [name], NP for primary care. I spoke with  Lisa [name] by phone today.  What matters to the patients health and wellness today?  Patient would like to obtain [name] 3 in 1 commode to be used as [name] raised toilet seat.    Goals Addressed             This Visit's Progress    Care Coordination Activities   On track    Care Coordination Interventions: Determined the patient has been in contact with [employer] regarding meals on wheels. She is currently on the wait list to receive meals Discussed the patient continues to receive grocery delivery from [employer] Confirmed patient has received both [name] bed and kitchen table from the [employer] bank - patient is very excited about her new furniture Determined the patient is still waiting on [name] raised toilet seat Contacted [employer] and was advised the order in June was for [name] straight cane which was delivered to the patient. Representative is unable to locate an order for [name] 3 in 1 commode. Representative did state patient last received [name] 3 in 1 in August of 2020 so insurance may not cover at this time Collaboration with patients primary care provider to request orders for [name] 3 in 1 be sent to [employer] per patients request        SDOH assessments and interventions completed:  No     Care Coordination Interventions:  Yes, provided   Interventions Today    Flowsheet Row Most Recent Value  Chronic Disease   Chronic disease during today's visit Hypertension (HTN)  General Interventions   General Interventions Discussed/Reviewed General Interventions Reviewed, Durable Medical Equipment (DME), Communication with  Durable Medical Equipment (DME) --  [3 in 1 commode]  Communication with PCP/Specialists        Follow up plan:  SW will continue  to follow    Encounter Outcome:  Pt. Visit Completed   Lisa [name], Lisa [name], CDP Social Worker, Certified Dementia Practitioner [facility] Care Management  Care Coordination [phone]

## 2022-12-08 NOTE — Patient Instructions (Signed)
Visit Information  Thank you for taking time to visit with me today. Please don't hesitate to contact me if I can be of assistance to you.   Following are the goals we discussed today:  - Engage with [employer] regarding raised toilet seat   If you are experiencing a Mental Health or Behavioral Health Crisis or need someone to talk to, please go to [facility] [address], [place] [phone]) call 911  The patient verbalized understanding of instructions, educational materials, and care plan provided today and DECLINED offer to receive copy of patient instructions, educational materials, and care plan.   [name], BSW, CDP Social Worker, Certified Dementia Practitioner [facility] Care Management  Care Coordination [phone]

## 2022-12-10 ENCOUNTER — Other Ambulatory Visit: Payer: Self-pay

## 2022-12-10 ENCOUNTER — Other Ambulatory Visit: Payer: Medicare HMO

## 2022-12-10 NOTE — Patient Instructions (Signed)
Visit Information  Thank you for taking time to visit with me today. Please don't hesitate to contact me if I can be of assistance to you before our next scheduled telephone appointment.  Following are the goals we discussed today:   Goals Addressed             This Visit's Progress    RNCM Care Management Expected Outcome:  Monitor, Self-Manage and Reduce Symptoms of Diabetes       Current Barriers:  Knowledge Deficits related to the need to check blood sugars on a regular basis, preventive care needs, and importance of following recommendations by the provider for effective management of DM and other chronic conditions Care Coordination needs related to resources the patient needs for senior housing so she can have a safe place to live without fear of being asked to move out in a patient with DM Chronic Disease Management support and education needs related to effective management of DM Lacks caregiver support.  Corporate treasurer.  Lab Results  Component Value Date   HGBA1C 6.4 11/21/2022     Planned Interventions: Provided education to patient about basic DM disease process. Review and education. The patient has multiple chronic conditions impacting her care. Her A1c elevated from the last labs, but still at goal. Her DM are stable. Discussed changes to monitor for and when to call the provider.; Reviewed medications with patient and discussed importance of medication adherence. 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 Denies any swelling. States compliance with medications.The patient sees the pcp on a regular basis, upcoming appointment on 12-30-2022.      Reviewed prescribed diet with patient heart healthy/ADA diet. Education and support given. The patient states she is staying away for spicy foods. Is taking protonix and also has medications for nausea if she  needs them. She does want a riser for the toilet she says this is on order and she says they will deliver when it comes in. A new order had to be placed for one and was done yesterday by the provider. She received one in 2020 so the insurance may not pay. She is being followed by the SW on a regular basis.  Counseled on importance of regular laboratory monitoring as prescribed. Has regular lab testing;        Discussed plans with patient for ongoing care management follow up and provided patient with direct contact information for care management team;      Provided patient with written educational materials related to hypo and hyperglycemia and importance of correct treatment. Denies any drops in her blood sugars or highs. Will continue to monitor;       Reviewed scheduled/upcoming provider appointments including: 12-30-2022 at 1040am;         Ongoing support and education from the SW. The patient is working on a regular basis with the SW.  She has the number to [employer] today: [phone].The patient is happy that she has her bed and a table and her sister came over this past weekend and put up some art work. She is thankful that she has a place of her own. She says this is really helping her and she does not have to worry about anyone mistreating her. Review of patient status, including review of consultants reports, relevant laboratory and other test results, and medications completed;  Advised patient to discuss changes in her DM, Questions, and concerns with provider;      Screening for signs and symptoms of depression related to chronic disease state;        Assessed social determinant of health barriers;      The patient has had 2 falls recently due to dizziness. She states that once she fell out of the bathtub and once she fell when she was in the kitchen getting some water. She states she has a life alert system now and she is thankful for that. She continues to work with the SW and  the [facility]. Will continue to monitor for new needs and changes. The patient is appreciative of all the help she gets and the team calling and talking to her. She has not had any new falls. She has stopped taking the metformin. She feels like this was one of the biggest reasons for her dizziness. She denies any acute findings today and denies any new periods of dizziness.   Symptom Management: Take medications as prescribed   Attend all scheduled provider appointments Call provider office for new concerns or questions  call the Suicide and Crisis Lifeline: 988 call the [place] National Suicide Prevention Lifeline: (220)601-9896 or TTY: 936-191-8527 TTY 262 044 4112) to talk to a trained counselor call 1-800-273-TALK (toll free, 24 hour hotline) if experiencing a Mental Health or Behavioral Health Crisis  schedule appointment with eye doctor check feet daily for cuts, sores or redness trim toenails straight across wash and dry feet carefully every day wear comfortable, cotton socks wear comfortable, well-fitting shoes  Follow Up Plan: Telephone follow up appointment with care management team member scheduled for: 02-10-2023 at 0900 am       RNCM Care Management Expected Outcome:  Monitor, Self-Manage, and Reduce Symptoms of Hypertension       Current Barriers:  Knowledge Deficits related to the benefits of taking blood pressures on a regular basis to monitor changes in blood pressures and decrease risk of heart attack and stroke Care Coordination needs related to educational needs for a patient with HTN and other chronic conditions and resources in the community to help patient with housing needs  in a patient with HTN Chronic Disease Management support and education needs related to effective management of HTN  Lacks caregiver support.  BP Readings from Last 3 Encounters:  11/21/22 (!) 200/75  09/29/22 (!) 150/57  09/25/22 (!) 125/55   Self reported blood pressure this am was  156/70  Planned Interventions: Evaluation of current treatment plan related to hypertension self management and patient's adherence to plan as established by provider.The patient had elevation in blood pressures the last time she was in the office but had not taken her medications. Review of elevations in blood pressures. Her sister had a cuff and she let  her use it and she is taking it again at  home. Denies any acute findings. Education and support given.  Provided education to patient re: stroke prevention, s/s of heart attack and stroke. The patient has a history of stroke and the patient mindful of the impact of uncontrolled blood pressures and risk of HA and stroke; Reviewed prescribed diet heart healthy/ADA diet- the patient states she is mindful of her dietary restrictions and she monitors her sodium and sugar intake. She also is monitoring for spicy foods now and knows she needs to watch her intake closely due to the recent hospitalization with finding ulcers present. The patient states today that she is doing "  real good" because she "loves herself". She is with the program [facility] [employer] and gets a box of food a month from them.  She is signed up for MOW in [facility] but is aware of the waiting list.  Reviewed medications with patient and discussed importance of compliance. The patient states that she does have her medications and she is taking her medications as directed. Has her medications and is compliant with medications. The patient is back on her hydralazine 25 mg BID, Entresto 49-51 mg BID, Amlodipine 5 mg QD and Carvedilol 6.25 mg BID. She is also still taking her Protonix 40 mg QD now.  Denies any medication needs at this time.  Discussed plans with patient for ongoing care management follow up and provided patient with direct contact information for care management team; Advised patient, providing education and rationale, to monitor blood pressure daily and record, calling PCP  for findings outside established parameters;  Reviewed scheduled/upcoming provider appointments including: Appointment on 12-30-2022 with the pcp. Knows to call for changes. Encouraged her to get a follow up with her cardiologist after she completes her other appointments Advised patient to discuss changes in her blood pressures or questions/Concerns about her heart health with provider; Provided education on prescribed diet heart healthy/ADA diet. Review and education. Also discussed avoiding spicy foods that could aggravate the presence of ulcers ;  Discussed complications of poorly controlled blood pressure such as heart disease, stroke, circulatory complications, vision complications, kidney impairment, sexual dysfunction;  Screening for signs and symptoms of depression related to chronic disease state;  Assessed social determinant of health barriers;   Symptom Management: Take medications as prescribed   Attend all scheduled provider appointments Call provider office for new concerns or questions  call the Suicide and Crisis Lifeline: 988 call the [place] National Suicide Prevention Lifeline: 414-587-2279 or TTY: 952-625-4434 TTY (715)021-1420) to talk to a trained counselor call 1-800-273-TALK (toll free, 24 hour hotline) if experiencing a Mental Health or Behavioral Health Crisis  check blood pressure weekly learn about high blood pressure call doctor for signs and symptoms of high blood pressure develop an action plan for high blood pressure keep all doctor appointments take medications for blood pressure exactly as prescribed report new symptoms to your doctor  Follow Up Plan: Telephone follow up appointment with care management team member scheduled for: 02-10-2023 at 0900 am           Our next appointment is by telephone on 02-10-2023 at 9 am  Please call the care guide team at [phone] if you need to cancel or reschedule your appointment.   If you are experiencing a  Mental Health or Behavioral Health Crisis or need someone to talk to, please call the Suicide and Crisis Lifeline: 988 call the [place] National Suicide Prevention Lifeline: 647-338-8224 or TTY: 432-414-7594 TTY 365-343-0390) to talk to a trained counselor call 1-800-273-TALK (toll free, 24 hour hotline) go to [facility] [address] [place] [phone])   The patient verbalized understanding of instructions, educational materials, and care plan provided today and DECLINED offer to receive copy of patient instructions, educational materials, and care plan.   Telephone follow up appointment with care management team member scheduled for: 02-10-2023 at 9 am  Alto Denver RN, MSN, CCM RN Care Manager  [facility]  Ambulatory Care Management  Direct Number: [phone]

## 2022-12-10 NOTE — Patient Outreach (Signed)
Care Management   Visit Note  12/10/2022 Name: Lisa [name] MRN: [id number] DOB: 1951/03/06  Subjective: Lisa [name] is a 72 y.o. year old female who is a primary care patient of [name], [name], NP. The Care Management team was consulted for assistance.      Engaged with patient spoke with patient by telephone.    Goals Addressed             This Visit's Progress    RNCM Care Management Expected Outcome:  Monitor, Self-Manage and Reduce Symptoms of Diabetes       Current Barriers:  Knowledge Deficits related to the need to check blood sugars on a regular basis, preventive care needs, and importance of following recommendations by the provider for effective management of DM and other chronic conditions Care Coordination needs related to resources the patient needs for senior housing so she can have a safe place to live without fear of being asked to move out in a patient with DM Chronic Disease Management support and education needs related to effective management of DM Lacks caregiver support.  Corporate treasurer.  Lab Results  Component Value Date   HGBA1C 6.4 11/21/2022     Planned Interventions: Provided education to patient about basic DM disease process. Review and education. The patient has multiple chronic conditions impacting her care. Her A1c elevated from the last labs, but still at goal. Her DM are stable. Discussed changes to monitor for and when to call the provider.; Reviewed medications with patient and discussed importance of medication adherence. 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 Denies any swelling. States compliance with medications.The patient sees the pcp on a regular basis, upcoming appointment on 12-30-2022.      Reviewed prescribed diet with patient heart healthy/ADA diet. Education and support given. The patient  states she is staying away for spicy foods. Is taking protonix and also has medications for nausea if she needs them. She does want a riser for the toilet she says this is on order and she says they will deliver when it comes in. A new order had to be placed for one and was done yesterday by the provider. She received one in 2020 so the insurance may not pay. She is being followed by the SW on a regular basis.  Counseled on importance of regular laboratory monitoring as prescribed. Has regular lab testing;        Discussed plans with patient for ongoing care management follow up and provided patient with direct contact information for care management team;      Provided patient with written educational materials related to hypo and hyperglycemia and importance of correct treatment. Denies any drops in her blood sugars or highs. Will continue to monitor;       Reviewed scheduled/upcoming provider appointments including: 12-30-2022 at 1040am;         Ongoing support and education from the SW. The patient is working on a regular basis with the SW.  She has the number to [employer] today: [phone].The patient is happy that she has her bed and a table and her sister came over this past weekend and put up some art work. She is thankful that she has a place of her own. She says this is really helping her and she does not have to worry about anyone mistreating her. Review  of patient status, including review of consultants reports, relevant laboratory and other test results, and medications completed;       Advised patient to discuss changes in her DM, Questions, and concerns with provider;      Screening for signs and symptoms of depression related to chronic disease state;        Assessed social determinant of health barriers;      The patient has had 2 falls recently due to dizziness. She states that once she fell out of the bathtub and once she fell when she was in the kitchen getting some water. She  states she has a life alert system now and she is thankful for that. She continues to work with the SW and the [facility]. Will continue to monitor for new needs and changes. The patient is appreciative of all the help she gets and the team calling and talking to her. She has not had any new falls. She has stopped taking the metformin. She feels like this was one of the biggest reasons for her dizziness. She denies any acute findings today and denies any new periods of dizziness.   Symptom Management: Take medications as prescribed   Attend all scheduled provider appointments Call provider office for new concerns or questions  call the Suicide and Crisis Lifeline: 988 call the [place] National Suicide Prevention Lifeline: 351-350-8197 or TTY: 220-068-5269 TTY 848 861 1411) to talk to a trained counselor call 1-800-273-TALK (toll free, 24 hour hotline) if experiencing a Mental Health or Behavioral Health Crisis  schedule appointment with eye doctor check feet daily for cuts, sores or redness trim toenails straight across wash and dry feet carefully every day wear comfortable, cotton socks wear comfortable, well-fitting shoes  Follow Up Plan: Telephone follow up appointment with care management team member scheduled for: 02-10-2023 at 0900 am       RNCM Care Management Expected Outcome:  Monitor, Self-Manage, and Reduce Symptoms of Hypertension       Current Barriers:  Knowledge Deficits related to the benefits of taking blood pressures on a regular basis to monitor changes in blood pressures and decrease risk of heart attack and stroke Care Coordination needs related to educational needs for a patient with HTN and other chronic conditions and resources in the community to help patient with housing needs  in a patient with HTN Chronic Disease Management support and education needs related to effective management of HTN  Lacks caregiver support.  BP Readings from Last 3 Encounters:  11/21/22 (!)  200/75  09/29/22 (!) 150/57  09/25/22 (!) 125/55   Self reported blood pressure this am was 156/70  Planned Interventions: Evaluation of current treatment plan related to hypertension self management and patient's adherence to plan as established by provider.The patient had elevation in blood pressures the last time she was in the office but had not taken her medications. Review of elevations in blood pressures. Her sister had a cuff and she let  her use it and she is taking it again at  home. Denies any acute findings. Education and support given.  Provided education to patient re: stroke prevention, s/s of heart attack and stroke. The patient has a history of stroke and the patient mindful of the impact of uncontrolled blood pressures and risk of HA and stroke; Reviewed prescribed diet heart healthy/ADA diet- the patient states she is mindful of her dietary restrictions and she monitors her sodium and sugar intake. She also is monitoring for spicy foods now and knows she  needs to watch her intake closely due to the recent hospitalization with finding ulcers present. The patient states today that she is doing "real good" because she "loves herself". She is with the program [employer] and gets a box of food a month from them.  She is signed up for MOW in [facility] but is aware of the waiting list.  Reviewed medications with patient and discussed importance of compliance. The patient states that she does have her medications and she is taking her medications as directed. Has her medications and is compliant with medications. The patient is back on her hydralazine 25 mg BID, Entresto 49-51 mg BID, Amlodipine 5 mg QD and Carvedilol 6.25 mg BID. She is also still taking her Protonix 40 mg QD now.  Denies any medication needs at this time.  Discussed plans with patient for ongoing care management follow up and provided patient with direct contact information for care management team; Advised  patient, providing education and rationale, to monitor blood pressure daily and record, calling PCP for findings outside established parameters;  Reviewed scheduled/upcoming provider appointments including: Appointment on 12-30-2022 with the pcp. Knows to call for changes. Encouraged her to get a follow up with her cardiologist after she completes her other appointments Advised patient to discuss changes in her blood pressures or questions/Concerns about her heart health with provider; Provided education on prescribed diet heart healthy/ADA diet. Review and education. Also discussed avoiding spicy foods that could aggravate the presence of ulcers ;  Discussed complications of poorly controlled blood pressure such as heart disease, stroke, circulatory complications, vision complications, kidney impairment, sexual dysfunction;  Screening for signs and symptoms of depression related to chronic disease state;  Assessed social determinant of health barriers;   Symptom Management: Take medications as prescribed   Attend all scheduled provider appointments Call provider office for new concerns or questions  call the Suicide and Crisis Lifeline: 988 call the [place] National Suicide Prevention Lifeline: 6362141813 or TTY: 918 078 5431 TTY 251-751-8622) to talk to a trained counselor call 1-800-273-TALK (toll free, 24 hour hotline) if experiencing a Mental Health or Behavioral Health Crisis  check blood pressure weekly learn about high blood pressure call doctor for signs and symptoms of high blood pressure develop an action plan for high blood pressure keep all doctor appointments take medications for blood pressure exactly as prescribed report new symptoms to your doctor  Follow Up Plan: Telephone follow up appointment with care management team member scheduled for: 02-10-2023 at 0900 am            Consent to Services:  Patient was given information about care management services, agreed to  services, and gave verbal consent to participate.   Plan: Telephone follow up appointment with care management team member scheduled for: 02-10-2023 at 0900 am  Alto Denver RN, MSN, CCM RN Care Manager  [facility]  Ambulatory Care Management  Direct Number: [phone]

## 2022-12-12 ENCOUNTER — Telehealth: Payer: Self-pay | Admitting: Nurse Practitioner

## 2022-12-12 MED ORDER — POLYETHYLENE GLYCOL 3350 17 GM/SCOOP PO POWD: 17.0000 g | Freq: Two times a day (BID) | ORAL | 1 refills | Status: DC | PRN

## 2022-12-12 NOTE — Telephone Encounter (Signed)
I called and spoke with patient and she said that [facility] will be fine to send to.

## 2022-12-12 NOTE — Telephone Encounter (Signed)
Pt is having bad constipation. She is concerned about a Hemorid. She is asking that something be called in.   [phone]

## 2022-12-16 ENCOUNTER — Ambulatory Visit: Payer: Self-pay

## 2022-12-16 DIAGNOSIS — I5022 Chronic systolic (congestive) heart failure: Secondary | ICD-10-CM | POA: Diagnosis not present

## 2022-12-16 DIAGNOSIS — I10 Essential (primary) hypertension: Secondary | ICD-10-CM | POA: Diagnosis not present

## 2022-12-16 DIAGNOSIS — R6889 Other general symptoms and signs: Secondary | ICD-10-CM | POA: Diagnosis not present

## 2022-12-16 DIAGNOSIS — E119 Type 2 diabetes mellitus without complications: Secondary | ICD-10-CM | POA: Diagnosis not present

## 2022-12-16 DIAGNOSIS — I2510 Atherosclerotic heart disease of native coronary artery without angina pectoris: Secondary | ICD-10-CM | POA: Diagnosis not present

## 2022-12-16 NOTE — Patient Instructions (Signed)
 Visit Information  Thank you for taking time to visit with me today. Please don't hesitate to contact me if I can be of assistance to you.   Following are the goals we discussed today:  - Contact your primary care provider as needed  If you are experiencing a Mental Health or Behavioral Health Crisis or need someone to talk to, please call 1-800-273-TALK (toll free, 24 hour hotline) go to [facility] [address] [place] [phone]) call 911  The patient verbalized understanding of instructions, educational materials, and care plan provided today and DECLINED offer to receive copy of patient instructions, educational materials, and care plan.   No further follow up required: Please contact me as needed  [name], BSW, CDP Social Worker, Certified Dementia Practitioner [facility] Care Management  Care Coordination [phone]

## 2022-12-16 NOTE — Patient Outreach (Signed)
  Care Coordination   Follow Up Visit Note   12/16/2022 Name: Shanel Contreras MRN: [id number] DOB: Mar 14, 1951  Starlett Hockert Libbert is a 72 y.o. year old female who sees [name], [name], NP for primary care. I spoke with  Frazier Richards Chio by phone today.  What matters to the patients health and wellness today?  The patient would like a raised toilet seat    Goals Addressed             This Visit's Progress    COMPLETED: Care Coordination Activities       Care Coordination Interventions: Determined the patient has yet to receive a 3 in 1 commode to be used as a raised toilet seat Contacted [employer] who advises the order was marked as inactive due to inability to engage with the patient. [employer] representative also advised SW the patients health plan would not cover equipment due to patient receiving a 3 in 1 within the past 5 years. Health plan will not cover cost until 2025 Advised the patient of the above - patient reports she has never received this equipment in the past Education provided on [employer] D[employer] which provides DME at no cost to patients - patient reports she does not have anyone who will pick up an item for her from this resource. Patient declines resource referral Encouraged the patient to contact SW as needed        SDOH assessments and interventions completed:  No     Care Coordination Interventions:  Yes, provided   Interventions Today    Flowsheet Row Most Recent Value  Chronic Disease   Chronic disease during today's visit Hypertension (HTN)  General Interventions   General Interventions Discussed/Reviewed General Interventions Reviewed, Durable Medical Equipment (DME)  Durable Medical Equipment (DME) Other  [3 in 1 commode]        Follow up plan: No further intervention required.   Encounter Outcome:  Pt. Visit Completed   [name], BSW, CDP Social Worker, Certified Dementia Practitioner [facility] Care Management  Care  Coordination [phone]

## 2022-12-19 ENCOUNTER — Other Ambulatory Visit: Payer: Self-pay | Admitting: Nurse Practitioner

## 2022-12-19 NOTE — Telephone Encounter (Signed)
Requesting: Diclofenac Sodium External Gel 1 %  Last Visit: 11/21/2022 Next Visit: 12/30/2022 Last Refill: 09/25/2022 by Dr. Lurene Shadow  Please Advise

## 2022-12-23 DIAGNOSIS — E876 Hypokalemia: Secondary | ICD-10-CM | POA: Diagnosis not present

## 2022-12-23 DIAGNOSIS — Z79899 Other long term (current) drug therapy: Secondary | ICD-10-CM | POA: Diagnosis not present

## 2022-12-23 DIAGNOSIS — E1165 Type 2 diabetes mellitus with hyperglycemia: Secondary | ICD-10-CM | POA: Diagnosis not present

## 2022-12-23 DIAGNOSIS — E034 Atrophy of thyroid (acquired): Secondary | ICD-10-CM | POA: Diagnosis not present

## 2022-12-23 DIAGNOSIS — R6889 Other general symptoms and signs: Secondary | ICD-10-CM | POA: Diagnosis not present

## 2022-12-23 DIAGNOSIS — E7849 Other hyperlipidemia: Secondary | ICD-10-CM | POA: Diagnosis not present

## 2022-12-23 DIAGNOSIS — D649 Anemia, unspecified: Secondary | ICD-10-CM | POA: Diagnosis not present

## 2022-12-25 ENCOUNTER — Other Ambulatory Visit: Payer: Self-pay

## 2022-12-25 ENCOUNTER — Other Ambulatory Visit (HOSPITAL_COMMUNITY): Payer: Self-pay

## 2022-12-25 MED ORDER — POLYETHYLENE GLYCOL 3350 17 GM/SCOOP PO POWD
17.0000 g | Freq: Two times a day (BID) | ORAL | 1 refills | Status: DC | PRN
  Filled 2022-12-25: qty 238, 7d supply, fill #0

## 2022-12-25 NOTE — Telephone Encounter (Signed)
I notified patient of message and she said that she has no way of getting her medicine unless it is mailed to her. No source of transportation to get medication till next month when she gets her check.

## 2022-12-25 NOTE — Telephone Encounter (Signed)
Pt said that her pharmacy does not carry Miralax anymore ans will you please call in something else.

## 2022-12-25 NOTE — Telephone Encounter (Signed)
I called [facility] and spoke with representative and patient can get Rx anywhere her insurance does not restrict her to only get her prescriptions there. AS for the Rx of Miralax the part of her plan does not cover her to have it filled there.

## 2022-12-25 NOTE — Addendum Note (Signed)
Addended by: Rodman Pickle A on: 12/25/2022 11:20 AM   Modules accepted: Orders

## 2022-12-25 NOTE — Telephone Encounter (Signed)
I called and spoke with patient and notified her of below message and gave her [facility] number and she will call. She appreciated [place] helping her and I told patient to call back if she had any issues.

## 2022-12-30 ENCOUNTER — Ambulatory Visit: Payer: Medicare HMO | Admitting: Nurse Practitioner

## 2023-01-07 ENCOUNTER — Ambulatory Visit (INDEPENDENT_AMBULATORY_CARE_PROVIDER_SITE_OTHER): Payer: Medicare HMO | Admitting: Nurse Practitioner

## 2023-01-07 ENCOUNTER — Encounter: Payer: Self-pay | Admitting: Nurse Practitioner

## 2023-01-07 VITALS — BP 138/76 | HR 70 | Temp 97.1°F | Ht 65.0 in | Wt 141.6 lb

## 2023-01-07 DIAGNOSIS — I10 Essential (primary) hypertension: Secondary | ICD-10-CM | POA: Diagnosis not present

## 2023-01-07 DIAGNOSIS — R200 Anesthesia of skin: Secondary | ICD-10-CM | POA: Diagnosis not present

## 2023-01-07 DIAGNOSIS — R202 Paresthesia of skin: Secondary | ICD-10-CM | POA: Diagnosis not present

## 2023-01-07 DIAGNOSIS — M5442 Lumbago with sciatica, left side: Secondary | ICD-10-CM | POA: Diagnosis not present

## 2023-01-07 DIAGNOSIS — Z23 Encounter for immunization: Secondary | ICD-10-CM

## 2023-01-07 DIAGNOSIS — G8929 Other chronic pain: Secondary | ICD-10-CM

## 2023-01-07 DIAGNOSIS — R6889 Other general symptoms and signs: Secondary | ICD-10-CM | POA: Diagnosis not present

## 2023-01-07 MED ORDER — NORTRIPTYLINE HCL 10 MG PO CAPS: 10.0000 mg | ORAL_CAPSULE | Freq: Every day | ORAL | 0 refills | Status: DC

## 2023-01-07 NOTE — Patient Instructions (Signed)
It was great to see you!  Keep taking your medications, your blood pressure has improved!   Start nortriptyline 1 capsule at bedtime to help with the pain in your hands.   I Have also placed a referral to pain management.   We have you scheduled for a mammogram at our office on 02/11/23 at 10:20am.   Let's follow-up in 2 months, sooner if you have concerns.  If a referral was placed today, you will be contacted for an appointment. Please note that routine referrals can sometimes take up to 3-4 weeks to process. Please call our office if you haven't heard anything after this time frame.  Take care,  Rodman Pickle, NP

## 2023-01-07 NOTE — Progress Notes (Signed)
Established Patient Office Visit  Subjective   Patient ID: Lisa [name], female    DOB: 1950-11-08  Age: 72 y.o. MRN: [id number]  Chief Complaint  Patient presents with   Hypertension    Follow up, concerns that medication is not helping her hands    HPI:  Lisa [name] is here to follow-up on hypertension and neuropathy.   She states that she is still having ongoing pain and numbness in her hands.  She states that the duloxetine did not help with the neuropathy at all, however her mood is still well-controlled and stable.  She denies chest pain and shortness of breath.  She has been taking her medications regularly.  She has also been experiencing pain in her back that will sometimes radiate down her left leg.  She states this comes and goes but tends to happen about every day.  She states that this started happening after her stroke.  She will have to lay down when it comes on.  She denies incontinence, fevers.    ROS See pertinent positives and negatives per HPI.    Objective:     BP 138/76 (BP Location: Left Arm)   Pulse 70   Temp (!) 97.1 F (36.2 C)   Ht 5\' 5"  (1.651 m)   Wt 141 lb 9.6 oz (64.2 kg)   SpO2 97%   BMI 23.56 kg/m    Physical Exam Vitals and nursing note reviewed.  Constitutional:      General: She is not in acute distress.    Appearance: Normal appearance.  HENT:     Head: Normocephalic.  Eyes:     Conjunctiva/sclera: Conjunctivae normal.  Cardiovascular:     Rate and Rhythm: Normal rate and regular rhythm.     Pulses: Normal pulses.     Heart sounds: Normal heart sounds.  Pulmonary:     Effort: Pulmonary effort is normal.     Breath sounds: Normal breath sounds.  Musculoskeletal:        General: No tenderness. Normal range of motion.     Cervical back: Normal range of motion.     Right lower leg: No edema.     Left lower leg: No edema.  Skin:    General: Skin is warm.  Neurological:     General: No focal deficit present.      Mental Status: She is alert and oriented to person, place, and time.  Psychiatric:        Mood and Affect: Mood normal.        Behavior: Behavior normal.        Thought Content: Thought content normal.        Judgment: Judgment normal.      Assessment & Plan:   Problem List Items Addressed This Visit       Cardiovascular and Mediastinum   Essential hypertension - Primary    Chronic, stable. Continue taking amlodipine 5 mg daily, carvedilol 12.5 mg twice daily, Entresto 49-51 mg twice daily, and triamterene hydrochlorothiazide 37.5-25 mg daily, and hydralazine 25 mg twice daily.  She has an appointment with cardiology next week.  Follow-up in 2 months.        Other   Left low back pain    Chronic, not controlled.  She still intermittent pain in her left lower back that will sometimes radiate down her left leg.  No red flags on exam..  She states that the duloxetine has not helped at all with the pain.  Will have her continue this for her anxiety.  She has tried gabapentin, Lyrica in the past without relief.  Will have her start nortriptyline 10 mg at bedtime.  Will also place referral to pain management send this is ongoing.  Follow-up in 2 months.      Relevant Medications   tiZANidine (ZANAFLEX) 2 MG tablet   Other Relevant Orders   Ambulatory referral to Pain Clinic   Numbness and tingling in both hands    Chronic, not controlled.  She still having neuropathy with pain and numbness in her hands along with stiffness.  She states that the duloxetine has not helped at all with the pain.  Will have her continue this for her anxiety.  She has tried gabapentin, Lyrica in the past without relief.  Will have her start nortriptyline 10 mg at bedtime.  Will also place referral to pain management send this is ongoing.  Follow-up in 2 months      Relevant Orders   Ambulatory referral to Pain Clinic   Other Visit Diagnoses     Immunization due       Flu vaccine given today   Relevant  Orders   Flu Vaccine Trivalent High Dose (Fluad) (Completed)       Return in about 2 months (around 03/09/2023) for neuropathy, back pain.    Gerre Scull, NP

## 2023-01-07 NOTE — Assessment & Plan Note (Addendum)
Chronic, not controlled.  She still intermittent pain in her left lower back that will sometimes radiate down her left leg.  No red flags on exam..  She states that the duloxetine has not helped at all with the pain.  Will have her continue this for her anxiety.  She has tried gabapentin, Lyrica in the past without relief.  Will have her start nortriptyline 10 mg at bedtime.  Will also place referral to pain management send this is ongoing.  Follow-up in 2 months.

## 2023-01-07 NOTE — Assessment & Plan Note (Signed)
Chronic, not controlled.  She still having neuropathy with pain and numbness in her hands along with stiffness.  She states that the duloxetine has not helped at all with the pain.  Will have her continue this for her anxiety.  She has tried gabapentin, Lyrica in the past without relief.  Will have her start nortriptyline 10 mg at bedtime.  Will also place referral to pain management send this is ongoing.  Follow-up in 2 months

## 2023-01-07 NOTE — Assessment & Plan Note (Signed)
Chronic, stable. Continue taking amlodipine 5 mg daily, carvedilol 12.5 mg twice daily, Entresto 49-51 mg twice daily, and triamterene hydrochlorothiazide 37.5-25 mg daily, and hydralazine 25 mg twice daily.  She has an appointment with cardiology next week.  Follow-up in 2 months.

## 2023-01-12 ENCOUNTER — Other Ambulatory Visit (HOSPITAL_COMMUNITY): Payer: Self-pay

## 2023-01-12 ENCOUNTER — Telehealth: Payer: Self-pay

## 2023-01-12 NOTE — Telephone Encounter (Signed)
Pharmacy Patient Advocate Encounter   Received notification from Physician's Office that prior authorization for Nortriptyline is required/requested.   Insurance verification completed.   The patient is insured through [employer] .   Per test claim: PA required; PA submitted to [facility] via CoverMyMeds Key/confirmation #/EOC Key: [id number] Status is pending

## 2023-01-14 ENCOUNTER — Other Ambulatory Visit (HOSPITAL_COMMUNITY): Payer: Self-pay

## 2023-01-14 NOTE — Telephone Encounter (Signed)
Pharmacy Patient Advocate Encounter  Received notification from [facility] that Prior Authorization for Nortriptyline has been APPROVED from 1.1.24 to 12.31.24. Ran test claim, & the Rx has been last filled on 01/13/23.   This test claim was processed through [facility]- copay amounts may vary at other pharmacies due to pharmacy/plan contracts, or as the patient moves through the different stages of their insurance plan.   PA #/Case ID/Reference #: (Key: [id number])

## 2023-01-22 DIAGNOSIS — R200 Anesthesia of skin: Secondary | ICD-10-CM | POA: Diagnosis not present

## 2023-01-22 DIAGNOSIS — M79642 Pain in left hand: Secondary | ICD-10-CM | POA: Diagnosis not present

## 2023-01-22 DIAGNOSIS — R6889 Other general symptoms and signs: Secondary | ICD-10-CM | POA: Diagnosis not present

## 2023-01-22 DIAGNOSIS — G8929 Other chronic pain: Secondary | ICD-10-CM | POA: Diagnosis not present

## 2023-01-22 DIAGNOSIS — M79641 Pain in right hand: Secondary | ICD-10-CM | POA: Diagnosis not present

## 2023-01-29 DIAGNOSIS — R6889 Other general symptoms and signs: Secondary | ICD-10-CM | POA: Diagnosis not present

## 2023-02-10 ENCOUNTER — Other Ambulatory Visit: Payer: Self-pay

## 2023-02-10 ENCOUNTER — Other Ambulatory Visit: Payer: Self-pay | Admitting: *Deleted

## 2023-02-10 ENCOUNTER — Other Ambulatory Visit: Payer: Medicare HMO

## 2023-02-10 NOTE — Patient Instructions (Signed)
Visit Information  Thank you for taking time to visit with me today. Please don't hesitate to contact me if I can be of assistance to you before our next scheduled telephone appointment.  Following are the goals we discussed today:   Goals Addressed             This Visit's Progress    RNCM Care Management Expected Outcome:  Monitor, Self-Manage and Reduce Symptoms of Diabetes       Current Barriers:  Knowledge Deficits related to the need to check blood sugars on a regular basis, preventive care needs, and importance of following recommendations by the provider for effective management of DM and other chronic conditions Care Coordination needs related to resources the patient needs for senior housing so she can have a safe place to live without fear of being asked to move out in a patient with DM Chronic Disease Management support and education needs related to effective management of DM Lacks caregiver support.  Corporate treasurer.  Lab Results  Component Value Date   HGBA1C 6.4 11/21/2022  Self reported CBG on 02/10/2023 110   Planned Interventions: Provided education to patient about basic DM disease process. Review and education. Discussed changes to monitor for and when to call the provider. Reviewed medications with patient and discussed importance of medication adherence. Patient states that she is compliant with her medication regime and checks her blood sugars at minimum twice daily. Self reported CBG today 110. Reviewed prescribed diet with patient heart healthy/ADA diet. Education and support given. Patient states that she often runs out of food at the end of the month but she always manages to make it through until the 2nd Wednesday of the month. She expressed how blessed she is to receive her husbands social security monthly but states that she is currently paying $1000 per month in rent leaving her with minimal funds to eat with. Counseled on importance of regular  laboratory monitoring as prescribed. Has regular lab testing;        Discussed plans with patient for ongoing care management follow up and provided patient with direct contact information for care management team;      Provided patient with written educational materials related to hypo and hyperglycemia and importance of correct treatment. Denies any drops in her blood sugars or highs. Will continue to monitor;       Reviewed scheduled/upcoming provider appointments including: 03-09-2023      Ongoing support and education from the SW. The patient is working on a regular basis with the SW.  She has the number to [employer] today: [phone]. Patient continues to express her gratitude for her apartment but states that she needs to find alternate housing because she does not know the person she is renting from nor is she able to put in a work order for her smoke detector that is missing. Review of patient status, including review of consultants reports, relevant laboratory and other test results, and medications completed;       Advised patient to discuss changes in her DM, Questions, and concerns with provider;      Screening for signs and symptoms of depression related to chronic disease state;        Assessed social determinant of health barriers;      No recent complaints of falls, wounds, or dizziness noted Patient stated she was unable to make it to her eye appointment because she did not have $44 copay or transportation last week. She  stated it was food her the eye appointment and she chose to buy groceries instead.   Symptom Management: Take medications as prescribed   Attend all scheduled provider appointments Call provider office for new concerns or questions  call the Suicide and Crisis Lifeline: 988 call the [place] National Suicide Prevention Lifeline: 267-481-7070 or TTY: (445)023-3568 TTY [phone]) to talk to a trained counselor call 1-800-273-TALK (toll free, 24 hour  hotline) if experiencing a Mental Health or Behavioral Health Crisis  schedule appointment with eye doctor check feet daily for cuts, sores or redness trim toenails straight across wash and dry feet carefully every day wear comfortable, cotton socks wear comfortable, well-fitting shoes  Follow Up Plan: Telephone follow up appointment with care management team member scheduled for: 03-24-2023 at 0900 am       RNCM Care Management Expected Outcome:  Monitor, Self-Manage, and Reduce Symptoms of Hypertension       Current Barriers:  Knowledge Deficits related to the benefits of taking blood pressures on a regular basis to monitor changes in blood pressures and decrease risk of heart attack and stroke Care Coordination needs related to educational needs for a patient with HTN and other chronic conditions and resources in the community to help patient with housing needs  in a patient with HTN Chronic Disease Management support and education needs related to effective management of HTN  Lacks caregiver support.  BP Readings from Last 3 Encounters:  02/10/23 137/65  01/07/23 138/76  11/21/22 (!) 200/75   Self reported blood pressure this am was 156/70  Planned Interventions: Evaluation of current treatment plan related to hypertension self management and patient's adherence to plan as established by provider. Regulary checks BP twice daily and is able to identify when her BP is elevated. Denies any acute findings. Education and support given.  Provided education to patient re: stroke prevention, s/s of heart attack and stroke. The patient has a history of stroke and the patient mindful of the impact of uncontrolled blood pressures and risk of HA and stroke; Reviewed prescribed diet heart healthy/ADA diet- the patient states she is mindful of her dietary restrictions and she monitors her sodium and sugar intake.  Reviewed medications with patient and discussed importance of compliance. The patient  states that she does have her medications and she is taking her medications as directed. Has her medications and is compliant with medications. Denies any medication needs at this time.  Discussed plans with patient for ongoing care management follow up and provided patient with direct contact information for care management team; Advised patient, providing education and rationale, to monitor blood pressure daily and record, calling PCP for findings outside established parameters;  Reviewed scheduled/upcoming provider appointments including: 03-09-2023 with PCP Advised patient to discuss changes in her blood pressures or questions/Concerns about her heart health with provider; Provided education on prescribed diet heart healthy/ADA diet. Review and education.  Discussed complications of poorly controlled blood pressure such as heart disease, stroke, circulatory complications, vision complications, kidney impairment, sexual dysfunction;  Screening for signs and symptoms of depression related to chronic disease state;  Assessed social determinant of health barriers;   Symptom Management: Take medications as prescribed   Attend all scheduled provider appointments Call provider office for new concerns or questions  call the Suicide and Crisis Lifeline: 988 call the [place] National Suicide Prevention Lifeline: 540-761-2643 or TTY: 571-006-6937 TTY (848)327-2220) to talk to a trained counselor call 1-800-273-TALK (toll free, 24 hour hotline) if experiencing a Mental Health or  Behavioral Health Crisis  check blood pressure weekly learn about high blood pressure call doctor for signs and symptoms of high blood pressure develop an action plan for high blood pressure keep all doctor appointments take medications for blood pressure exactly as prescribed report new symptoms to your doctor  Follow Up Plan: Telephone follow up appointment with care management team member scheduled for: 03-24-2023 at 0900  am           Our next appointment is by telephone on 03-24-2023 at 9:00 am  Please call the care guide team at [phone] if you need to cancel or reschedule your appointment.   If you are experiencing a Mental Health or Behavioral Health Crisis or need someone to talk to, please call the Suicide and Crisis Lifeline: 988 call the [place] National Suicide Prevention Lifeline: (626)126-9711 or TTY: 5793819029 TTY 425-378-8502) to talk to a trained counselor call 1-800-273-TALK (toll free, 24 hour hotline) go to [facility] [address], [place] [phone]) call 911   The patient verbalized understanding of instructions, educational materials, and care plan provided today and DECLINED offer to receive copy of patient instructions, educational materials, and care plan.   Telephone follow up appointment with care management team member scheduled for: 03-24-2023 at 9:00 am  Danise Edge, BSN RN RN Care Manager  [facility]  Ambulatory Care Management  Direct Number: [phone]

## 2023-02-10 NOTE — Patient Outreach (Signed)
Care Management   Visit Note  02/10/2023 Name: Lisa [name] MRN: [id number] DOB: [birth year]  Subjective: Lisa [name] is a 72 y.o. year old female who is a primary care patient of [name], [name], NP. The Care Management team was consulted for assistance.      Engaged with patient spoke with patient by telephone.    Goals Addressed             This Visit's Progress    RNCM Care Management Expected Outcome:  Monitor, Self-Manage and Reduce Symptoms of Diabetes       Current Barriers:  Knowledge Deficits related to the need to check blood sugars on a regular basis, preventive care needs, and importance of following recommendations by the provider for effective management of DM and other chronic conditions Care Coordination needs related to resources the patient needs for senior housing so she can have a safe place to live without fear of being asked to move out in a patient with DM Chronic Disease Management support and education needs related to effective management of DM Lacks caregiver support.  Corporate treasurer.  Lab Results  Component Value Date   HGBA1C 6.4 11/21/2022  Self reported CBG on 02/10/2023 110   Planned Interventions: Provided education to patient about basic DM disease process. Review and education. Discussed changes to monitor for and when to call the provider. Reviewed medications with patient and discussed importance of medication adherence. Patient states that she is compliant with her medication regime and checks her blood sugars at minimum twice daily. Self reported CBG today 110. Reviewed prescribed diet with patient heart healthy/ADA diet. Education and support given. Patient states that she often runs out of food at the end of the month but she always manages to make it through until the 2nd Wednesday of the month. She expressed how blessed she is to receive her husbands social security monthly but states that she is currently paying $1000  per month in rent leaving her with minimal funds to eat with. Counseled on importance of regular laboratory monitoring as prescribed. Has regular lab testing;        Discussed plans with patient for ongoing care management follow up and provided patient with direct contact information for care management team;      Provided patient with written educational materials related to hypo and hyperglycemia and importance of correct treatment. Denies any drops in her blood sugars or highs. Will continue to monitor;       Reviewed scheduled/upcoming provider appointments including: 03-09-2023      Ongoing support and education from the SW. The patient is working on a regular basis with the SW.  She has the number to [employer] today: [phone]. Patient continues to express her gratitude for her apartment but states that she needs to find alternate housing because she does not know the person she is renting from nor is she able to put in a work order for her smoke detector that is missing. Review of patient status, including review of consultants reports, relevant laboratory and other test results, and medications completed;       Advised patient to discuss changes in her DM, Questions, and concerns with provider;      Screening for signs and symptoms of depression related to chronic disease state;        Assessed social determinant of health barriers;      No recent complaints of falls, wounds, or dizziness noted Patient stated she was  unable to make it to her eye appointment because she did not have $44 copay or transportation last week. She stated it was food her the eye appointment and she chose to buy groceries instead.   Symptom Management: Take medications as prescribed   Attend all scheduled provider appointments Call provider office for new concerns or questions  call the Suicide and Crisis Lifeline: 988 call the [place] National Suicide Prevention Lifeline: 519-377-3266 or TTY: 708-339-7881  TTY (614)243-4669) to talk to a trained counselor call 1-800-273-TALK (toll free, 24 hour hotline) if experiencing a Mental Health or Behavioral Health Crisis  schedule appointment with eye doctor check feet daily for cuts, sores or redness trim toenails straight across wash and dry feet carefully every day wear comfortable, cotton socks wear comfortable, well-fitting shoes  Follow Up Plan: Telephone follow up appointment with care management team member scheduled for: 03-24-2023 at 0900 am       RNCM Care Management Expected Outcome:  Monitor, Self-Manage, and Reduce Symptoms of Hypertension       Current Barriers:  Knowledge Deficits related to the benefits of taking blood pressures on a regular basis to monitor changes in blood pressures and decrease risk of heart attack and stroke Care Coordination needs related to educational needs for a patient with HTN and other chronic conditions and resources in the community to help patient with housing needs  in a patient with HTN Chronic Disease Management support and education needs related to effective management of HTN  Lacks caregiver support.  BP Readings from Last 3 Encounters:  02/10/23 137/65  01/07/23 138/76  11/21/22 (!) 200/75   Self reported blood pressure this am was 156/70  Planned Interventions: Evaluation of current treatment plan related to hypertension self management and patient's adherence to plan as established by provider. Regulary checks BP twice daily and is able to identify when her BP is elevated. Denies any acute findings. Education and support given.  Provided education to patient re: stroke prevention, s/s of heart attack and stroke. The patient has a history of stroke and the patient mindful of the impact of uncontrolled blood pressures and risk of HA and stroke; Reviewed prescribed diet heart healthy/ADA diet- the patient states she is mindful of her dietary restrictions and she monitors her sodium and sugar  intake.  Reviewed medications with patient and discussed importance of compliance. The patient states that she does have her medications and she is taking her medications as directed. Has her medications and is compliant with medications. Denies any medication needs at this time.  Discussed plans with patient for ongoing care management follow up and provided patient with direct contact information for care management team; Advised patient, providing education and rationale, to monitor blood pressure daily and record, calling PCP for findings outside established parameters;  Reviewed scheduled/upcoming provider appointments including: 03-09-2023 with PCP Advised patient to discuss changes in her blood pressures or questions/Concerns about her heart health with provider; Provided education on prescribed diet heart healthy/ADA diet. Review and education.  Discussed complications of poorly controlled blood pressure such as heart disease, stroke, circulatory complications, vision complications, kidney impairment, sexual dysfunction;  Screening for signs and symptoms of depression related to chronic disease state;  Assessed social determinant of health barriers;   Symptom Management: Take medications as prescribed   Attend all scheduled provider appointments Call provider office for new concerns or questions  call the Suicide and Crisis Lifeline: 988 call the [place] National Suicide Prevention Lifeline: 731-767-1486 or TTY: (925) 682-7287 TTY (  305-707-3324) to talk to a trained counselor call 1-800-273-TALK (toll free, 24 hour hotline) if experiencing a Mental Health or Behavioral Health Crisis  check blood pressure weekly learn about high blood pressure call doctor for signs and symptoms of high blood pressure develop an action plan for high blood pressure keep all doctor appointments take medications for blood pressure exactly as prescribed report new symptoms to your doctor  Follow Up Plan:  Telephone follow up appointment with care management team member scheduled for: 03-24-2023 at 0900 am              Consent to Services:  Patient was given information about care management services, agreed to services, and gave verbal consent to participate.   Plan: Telephone follow up appointment with care management team member scheduled for: 03-24-2023 at 9:00 am  Danise Edge, BSN RN RN Care Manager  [facility]  Ambulatory Care Management  Direct Number: [phone]

## 2023-02-11 ENCOUNTER — Ambulatory Visit
Admission: RE | Admit: 2023-02-11 | Discharge: 2023-02-11 | Disposition: A | Discharge status: Home / Self Care | Payer: Medicare HMO | Source: Ambulatory Visit | Attending: Nurse Practitioner | Admitting: Nurse Practitioner

## 2023-02-11 DIAGNOSIS — R6889 Other general symptoms and signs: Secondary | ICD-10-CM | POA: Diagnosis not present

## 2023-02-11 DIAGNOSIS — Z1231 Encounter for screening mammogram for malignant neoplasm of breast: Secondary | ICD-10-CM | POA: Diagnosis not present

## 2023-02-17 ENCOUNTER — Other Ambulatory Visit: Payer: Self-pay | Admitting: Nurse Practitioner

## 2023-02-17 DIAGNOSIS — R928 Other abnormal and inconclusive findings on diagnostic imaging of breast: Secondary | ICD-10-CM

## 2023-02-17 NOTE — Progress Notes (Signed)
Scheduled with Wilhemina Cash 02/27/23  [facility]  Care Coordination Care Guide  Direct Dial: [phone]

## 2023-02-18 DIAGNOSIS — I2510 Atherosclerotic heart disease of native coronary artery without angina pectoris: Secondary | ICD-10-CM | POA: Diagnosis not present

## 2023-02-18 DIAGNOSIS — R6889 Other general symptoms and signs: Secondary | ICD-10-CM | POA: Diagnosis not present

## 2023-02-18 DIAGNOSIS — I5022 Chronic systolic (congestive) heart failure: Secondary | ICD-10-CM | POA: Diagnosis not present

## 2023-02-18 DIAGNOSIS — E119 Type 2 diabetes mellitus without complications: Secondary | ICD-10-CM | POA: Diagnosis not present

## 2023-02-18 DIAGNOSIS — I10 Essential (primary) hypertension: Secondary | ICD-10-CM | POA: Diagnosis not present

## 2023-02-27 ENCOUNTER — Ambulatory Visit: Payer: Self-pay | Admitting: Licensed Clinical Social Worker

## 2023-02-27 NOTE — Patient Instructions (Signed)
Visit Information  Thank you for taking time to visit with me today. Please don't hesitate to contact me if I can be of assistance to you.   Following are the goals we discussed today:   Goals Addressed             This Visit's Progress    Care Coordination Activities       Care Coordination Interventions: Patient stated that family and friends assit with food from food banks form [place], [place] will send out a list of food pantry for [facility]  Patient originally had a need for housing, but now has an apartment that someone is allowing her to rent for $1000 per month, and the patient is very satisfied with her place.  SW completed the SDOH and no other issues.         Our next appointment is by telephone on 03/13/2023 at 1:00 pm  Please call the care guide team at [phone] if you need to cancel or reschedule your appointment.   If you are experiencing a Mental Health or Behavioral Health Crisis or need someone to talk to, please call the Suicide and Crisis Lifeline: 988 go to [facility] [address], [place] [phone]) call 911  The patient verbalized understanding of instructions, educational materials, and care plan provided today and DECLINED offer to receive copy of patient instructions, educational materials, and care plan.   Jeanie Cooks, PhD [facility], [facility] Social Worker Direct Dial: [phone]  Fax: [phone]

## 2023-02-27 NOTE — Patient Outreach (Signed)
  Care Coordination   Initial Visit Note   02/27/2023 Name: Lisa [name] MRN: [id number] DOB: [birth year]  Lisa [name] is a 72 y.o. year old female who sees [name], [name], NP for primary care. I spoke with  Lisa [name] by phone today.  What matters to the patients health and wellness today?  Food resources    Goals Addressed             This Visit's Progress    Care Coordination Activities       Care Coordination Interventions: Patient stated that family and friends assit with food from food banks form [place], [place] will send out a list of food pantry for [facility]  Patient originally had a need for housing, but now has an apartment that someone is allowing her to rent for $1000 per month, and the patient is very satisfied with her place.  SW completed the SDOH and no other issues.         SDOH assessments and interventions completed:  Yes  SDOH Interventions Today    Flowsheet Row Most Recent Value  SDOH Interventions   Food Insecurity Interventions Other (Comment)  [Patient stated that family and friends assit with food form food banks form [place], [place] will send out a list for [employer] [place]]  Housing Interventions Intervention Not Indicated  Transportation Interventions Intervention Not Indicated  Utilities Interventions Intervention Not Indicated        Care Coordination Interventions:  Yes, provided  Interventions Today    Flowsheet Row Most Recent Value  General Interventions   General Interventions Discussed/Reviewed General Interventions Discussed, [employer] stated that family and friends assit with food form food banks form [place], [place] will send out a list for [place]]        Follow up plan: Follow up call scheduled for 03/13/2023 at 1:00 pm    Encounter Outcome:  Patient Visit Completed   Jeanie Cooks, PhD [facility], [facility] Social  Worker Direct Dial: [phone]  Fax: [phone]

## 2023-03-03 DIAGNOSIS — R6889 Other general symptoms and signs: Secondary | ICD-10-CM | POA: Diagnosis not present

## 2023-03-03 DIAGNOSIS — G5603 Carpal tunnel syndrome, bilateral upper limbs: Secondary | ICD-10-CM | POA: Diagnosis not present

## 2023-03-07 ENCOUNTER — Other Ambulatory Visit: Payer: Self-pay | Admitting: Nurse Practitioner

## 2023-03-07 ENCOUNTER — Other Ambulatory Visit: Payer: Medicare HMO

## 2023-03-07 ENCOUNTER — Ambulatory Visit
Admission: RE | Admit: 2023-03-07 | Discharge: 2023-03-07 | Disposition: A | Discharge status: Home / Self Care | Payer: Medicare HMO | Source: Ambulatory Visit | Attending: Nurse Practitioner | Admitting: Nurse Practitioner

## 2023-03-07 ENCOUNTER — Ambulatory Visit: Payer: Medicare HMO

## 2023-03-07 DIAGNOSIS — R928 Other abnormal and inconclusive findings on diagnostic imaging of breast: Secondary | ICD-10-CM

## 2023-03-07 DIAGNOSIS — R921 Mammographic calcification found on diagnostic imaging of breast: Secondary | ICD-10-CM

## 2023-03-07 DIAGNOSIS — R6889 Other general symptoms and signs: Secondary | ICD-10-CM | POA: Diagnosis not present

## 2023-03-09 ENCOUNTER — Encounter: Payer: Self-pay | Admitting: Nurse Practitioner

## 2023-03-09 ENCOUNTER — Ambulatory Visit (INDEPENDENT_AMBULATORY_CARE_PROVIDER_SITE_OTHER): Payer: Medicare HMO | Admitting: Nurse Practitioner

## 2023-03-09 VITALS — BP 200/90 | HR 74 | Temp 98.1°F | Ht 65.0 in | Wt 146.4 lb

## 2023-03-09 DIAGNOSIS — R1032 Left lower quadrant pain: Secondary | ICD-10-CM | POA: Diagnosis not present

## 2023-03-09 DIAGNOSIS — R6889 Other general symptoms and signs: Secondary | ICD-10-CM | POA: Diagnosis not present

## 2023-03-09 DIAGNOSIS — K5900 Constipation, unspecified: Secondary | ICD-10-CM | POA: Insufficient documentation

## 2023-03-09 DIAGNOSIS — G8929 Other chronic pain: Secondary | ICD-10-CM | POA: Diagnosis not present

## 2023-03-09 DIAGNOSIS — J309 Allergic rhinitis, unspecified: Secondary | ICD-10-CM | POA: Diagnosis not present

## 2023-03-09 DIAGNOSIS — I10 Essential (primary) hypertension: Secondary | ICD-10-CM | POA: Diagnosis not present

## 2023-03-09 DIAGNOSIS — M545 Low back pain, unspecified: Secondary | ICD-10-CM | POA: Diagnosis not present

## 2023-03-09 LAB — CBC WITH DIFFERENTIAL/PLATELET
Basophils Absolute: 0.1 10*3/uL (ref 0.0–0.1)
Basophils Relative: 1.1 % (ref 0.0–3.0)
Eosinophils Absolute: 0.1 10*3/uL (ref 0.0–0.7)
Eosinophils Relative: 2.5 % (ref 0.0–5.0)
HCT: 36.5 % (ref 36.0–46.0)
Hemoglobin: 12 g/dL (ref 12.0–15.0)
Lymphocytes Relative: 23.3 % (ref 12.0–46.0)
Lymphs Abs: 1.1 10*3/uL (ref 0.7–4.0)
MCHC: 32.9 g/dL (ref 30.0–36.0)
MCV: 93.3 fL (ref 78.0–100.0)
Monocytes Absolute: 0.5 10*3/uL (ref 0.1–1.0)
Monocytes Relative: 9.9 % (ref 3.0–12.0)
Neutro Abs: 3 10*3/uL (ref 1.4–7.7)
Neutrophils Relative %: 63.2 % (ref 43.0–77.0)
Platelets: 277 10*3/uL (ref 150.0–400.0)
RBC: 3.91 Mil/uL (ref 3.87–5.11)
RDW: 15.6 % — ABNORMAL HIGH (ref 11.5–15.5)
WBC: 4.8 10*3/uL (ref 4.0–10.5)

## 2023-03-09 LAB — COMPREHENSIVE METABOLIC PANEL
ALT: 15 U/L (ref 0–35)
AST: 20 U/L (ref 0–37)
Albumin: 4.5 g/dL (ref 3.5–5.2)
Alkaline Phosphatase: 87 U/L (ref 39–117)
BUN: 13 mg/dL (ref 6–23)
CO2: 25 meq/L (ref 19–32)
Calcium: 9.4 mg/dL (ref 8.4–10.5)
Chloride: 105 meq/L (ref 96–112)
Creatinine, Ser: 0.91 mg/dL (ref 0.40–1.20)
GFR: 63.44 mL/min (ref >60.00)
Glucose, Bld: 122 mg/dL — ABNORMAL HIGH (ref 70–99)
Potassium: 3.9 meq/L (ref 3.5–5.1)
Sodium: 139 meq/L (ref 135–145)
Total Bilirubin: 0.5 mg/dL (ref 0.2–1.2)
Total Protein: 8.3 g/dL (ref 6.0–8.3)

## 2023-03-09 LAB — POCT URINALYSIS DIPSTICK
Bilirubin, UA: NEGATIVE
Blood, UA: POSITIVE
Glucose, UA: NEGATIVE
Ketones, UA: NEGATIVE
Leukocytes, UA: NEGATIVE
Nitrite, UA: NEGATIVE
Protein, UA: NEGATIVE
Spec Grav, UA: 1.01 (ref 1.010–1.025)
Urobilinogen, UA: 0.2 U/dL
pH, UA: 7 (ref 5.0–8.0)

## 2023-03-09 MED ORDER — LORATADINE 10 MG PO TABS: 10.0000 mg | ORAL_TABLET | Freq: Every day | ORAL | 0 refills | Status: DC

## 2023-03-09 MED ORDER — ENTRESTO 97-103 MG PO TABS: 1.0000 | ORAL_TABLET | Freq: Two times a day (BID) | ORAL | 1 refills | Status: DC

## 2023-03-09 NOTE — Assessment & Plan Note (Signed)
Her chronic back pain is worsening. She has an upcoming follow-up with a pain specialist. We will continue the current management and ensure she follows up with the pain specialist on December 9th.

## 2023-03-09 NOTE — Assessment & Plan Note (Signed)
She reports runny nose and cough, worsening at night, for the past couple of weeks, without associated facial pain or sore throat. We will prescribe loratadine 10mg  daily for these symptoms.

## 2023-03-09 NOTE — Patient Instructions (Signed)
It was great to see you!  Increase your entresto to 97-103 mg twice a day. This will be sent to your pharmacy. When you get it, stop the old dose.   Start claritin (loratadine) 1 tablet daily for your runny nose.   Start taking your miralax every day. Keep drinking plenty of water  Let's follow-up in 3 weeks, sooner if you have concerns.  If a referral was placed today, you will be contacted for an appointment. Please note that routine referrals can sometimes take up to 3-4 weeks to process. Please call our office if you haven't heard anything after this time frame.  Take care,  Rodman Pickle, NP

## 2023-03-09 NOTE — Progress Notes (Signed)
Established Patient Office Visit  Subjective   Patient ID: Lisa [name], female    DOB: [birth year]  Age: 72 y.o. MRN: [id number]  Chief Complaint  Patient presents with   Neuropathy and Back Pain    Follow up, concerns with left side pelvic pain since September, sneezing and runny nose for 2 weeks    HPI  Discussed the use of AI scribe software for clinical note transcription with the patient, who gave verbal consent to proceed.  History of Present Illness   Lisa [name], a patient with a history of stroke, presents with worsening left pelvic pain that started in September. The pain is particularly bothersome when walking and has been progressively worsening. The patient also reports constipation, with bowel movements only two to three times a week. The patient has been taking miralax for constipation, which has been helpful. She does not take this every day and has about 2-3 BM per week. The patient also reports worsening back pain and has an upcoming appointment with a pain specialist. The patient denies chest pain, shortness of breath, nausea, and blood in stool. The patient's blood pressure was high during the visit. The patient also reports stress related to concerns about her son. The patient has been monitoring her blood sugars at home, which have been well controlled. The patient also reports numbness and tingling in her hands.   She also notes rhinorrhea for the last 2 weeks. She does have a slight cough that is worse at night. She denies fevers, sore throat, ear pain, and shortness of breath.       ROS See pertinent positives and negatives per HPI.    Objective:     BP (!) 200/90 (BP Location: Right Arm, Cuff Size: Normal)   Pulse 74   Temp 98.1 F (36.7 C) (Oral)   Ht 5\' 5"  (1.651 m)   Wt 146 lb 6.4 oz (66.4 kg)   SpO2 97%   BMI 24.36 kg/m    Physical Exam Vitals and nursing note reviewed.  Constitutional:      General: She is not in acute distress.     Appearance: Normal appearance.  HENT:     Head: Normocephalic.     Right Ear: Tympanic membrane, ear canal and external ear normal.     Left Ear: Tympanic membrane, ear canal and external ear normal.     Nose: Rhinorrhea present. No congestion.  Eyes:     Conjunctiva/sclera: Conjunctivae normal.  Cardiovascular:     Rate and Rhythm: Normal rate and regular rhythm.     Pulses: Normal pulses.     Heart sounds: Normal heart sounds.  Pulmonary:     Effort: Pulmonary effort is normal.     Breath sounds: Normal breath sounds.  Abdominal:     General: There is no distension.     Palpations: Abdomen is soft.     Tenderness: There is abdominal tenderness (LLQ, LUQ). There is no guarding.     Hernia: No hernia is present.  Musculoskeletal:     Cervical back: Normal range of motion.  Skin:    General: Skin is warm.  Neurological:     General: No focal deficit present.     Mental Status: She is alert and oriented to person, place, and time.  Psychiatric:        Mood and Affect: Mood normal.        Behavior: Behavior normal.        Thought Content: Thought content normal.  Judgment: Judgment normal.      Assessment & Plan:   Problem List Items Addressed This Visit       Cardiovascular and Mediastinum   Essential hypertension    Her blood pressure was elevated at today's visit, 200/90.  We will increase her entresto to 97-103mg  BID, and continue amlodpine 5mg  daily, coreg 12.5mg  BID, hydralazine 25mg  BID, and triamterene-hctz 37.5-25mg  daily. Encouraged her to check her blood pressure at home. Follow-up in 2-3 weeks.      Relevant Medications   sacubitril-valsartan (ENTRESTO) 97-103 MG     Respiratory   Allergic rhinitis    She reports runny nose and cough, worsening at night, for the past couple of weeks, without associated facial pain or sore throat. We will prescribe loratadine 10mg  daily for these symptoms.         Other   Left low back pain    Her chronic back  pain is worsening. She has an upcoming follow-up with a pain specialist. We will continue the current management and ensure she follows up with the pain specialist on December 9th.      LLQ pain - Primary    Chronic left pelvic pain, worsening and exacerbated by walking, presents without associated gastrointestinal or urinary symptoms. She has a history of stroke on the same side, suggesting a possible relation to her pain. Constipation may also be contributing to her discomfort. We will increase the frequency of MiraLAX to daily and check CMP, CBC, and U/A today.       Relevant Orders   CBC with Differential/Platelet   Comprehensive metabolic panel   POCT urinalysis dipstick (Completed)   Constipation    She experiences infrequent bowel movements, occurring 2-3 times per week, despite using MiraLAX. We will increase MiraLAX to daily to address this issue.       Return in about 3 weeks (around 03/30/2023) for HTN.    Gerre Scull, NP

## 2023-03-09 NOTE — Assessment & Plan Note (Signed)
She experiences infrequent bowel movements, occurring 2-3 times per week, despite using MiraLAX. We will increase MiraLAX to daily to address this issue.

## 2023-03-09 NOTE — Assessment & Plan Note (Signed)
Chronic left pelvic pain, worsening and exacerbated by walking, presents without associated gastrointestinal or urinary symptoms. She has a history of stroke on the same side, suggesting a possible relation to her pain. Constipation may also be contributing to her discomfort. We will increase the frequency of MiraLAX to daily and check CMP, CBC, and U/A today.

## 2023-03-09 NOTE — Assessment & Plan Note (Signed)
Her blood pressure was elevated at today's visit, 200/90.  We will increase her entresto to 97-103mg  BID, and continue amlodpine 5mg  daily, coreg 12.5mg  BID, hydralazine 25mg  BID, and triamterene-hctz 37.5-25mg  daily. Encouraged her to check her blood pressure at home. Follow-up in 2-3 weeks.

## 2023-03-10 ENCOUNTER — Telehealth: Payer: Self-pay | Admitting: Nurse Practitioner

## 2023-03-10 NOTE — Telephone Encounter (Signed)
Patient notified of recent lab results and no questions or concerns.

## 2023-03-10 NOTE — Telephone Encounter (Signed)
Pt is wanting a cb concerning her most recent lab results.  Please advise pt at [phone].

## 2023-03-13 ENCOUNTER — Encounter: Payer: Self-pay | Admitting: Licensed Clinical Social Worker

## 2023-03-18 ENCOUNTER — Other Ambulatory Visit: Payer: Self-pay | Admitting: Nurse Practitioner

## 2023-03-18 NOTE — Telephone Encounter (Signed)
Requesting: Triamterene-HCTZ Oral Tablet 37.5-25 MG  Last Visit: 03/09/2023 Next Visit: 03/30/2023 Last Refill: 09/02/2022  Please Advise

## 2023-03-23 DIAGNOSIS — G5603 Carpal tunnel syndrome, bilateral upper limbs: Secondary | ICD-10-CM | POA: Diagnosis not present

## 2023-03-23 DIAGNOSIS — G8929 Other chronic pain: Secondary | ICD-10-CM | POA: Diagnosis not present

## 2023-03-23 DIAGNOSIS — R6889 Other general symptoms and signs: Secondary | ICD-10-CM | POA: Diagnosis not present

## 2023-03-24 ENCOUNTER — Telehealth: Payer: Self-pay

## 2023-03-24 ENCOUNTER — Ambulatory Visit: Payer: Self-pay

## 2023-03-24 NOTE — Patient Outreach (Addendum)
  Care Coordination   Initial Visit Note   03/24/2023 Name: Lisa [name] MRN: [id number] DOB: 1950/10/30  Lisa [name] is a 72 y.o. year old female who sees [name], [name], NP for primary care. I spoke with  Lisa [name] by phone today.  What matters to the patients health and wellness today?  Blood pressure control.    Goals Addressed             This Visit's Progress    Diabetes and HTN Management       Patient Goals/Self Care Activities: -Patient/Caregiver will take medications as prescribed   -Patient/Caregiver will attend all scheduled provider appointments -Patient/Caregiver will call provider office for new concerns or questions   -Calls provider office for new concerns, questions, or BP outside discussed parameters -Checks BP and records as discussed -Follows a low sodium diet/DASH diet -check blood sugar at prescribed times -check blood sugar if I feel it is too high or too low -record values and write them down take them to all doctor visits  -schedule appointment with eye doctor -check feet daily for cuts, sores or redness -trim toenails straight across -wash and dry feet carefully every day -wear comfortable, cotton socks -wear comfortable, well-fitting shoes    Spoke with patient. She reports doing wonderful.  She says her blood pressure is better today at  143/80.  Discussed HTN Management and importance of monitoring blood pressure and writing down to take to PCP. She verbalized understanding. She is doing well with sugar management.  Blood sugar this am 94 and last A1c 6.4.  Discussed diabetes management and importance.  She verbalized understanding and voices no RN CM concerns.           SDOH assessments and interventions completed:  Yes  SDOH Interventions Today    Flowsheet Row Most Recent Value  SDOH Interventions   Food Insecurity Interventions Intervention Not Indicated  Transportation Interventions Intervention Not Indicated   Utilities Interventions Intervention Not Indicated  Health Literacy Interventions Intervention Not Indicated        Care Coordination Interventions:  Yes, provided   Follow up plan: Follow up call scheduled for 04/07/23    Encounter Outcome:  Patient Visit Completed   Lisa Silveria Idelle Jo, RN, MSN RN Care Manager [facility], Population Health Direct Dial: [phone]  Fax: [phone] Website: [email or web address]

## 2023-03-24 NOTE — Patient Instructions (Signed)
Visit Information  Thank you for taking time to visit with me today. Please don't hesitate to contact me if I can be of assistance to you.   Following are the goals we discussed today:   Goals Addressed             This Visit's Progress    Diabetes and HTN Management       Patient Goals/Self Care Activities: -Patient/Caregiver will take medications as prescribed   -Patient/Caregiver will attend all scheduled provider appointments -Patient/Caregiver will call provider office for new concerns or questions   -Calls provider office for new concerns, questions, or BP outside discussed parameters -Checks BP and records as discussed -Follows a low sodium diet/DASH diet -check blood sugar at prescribed times -check blood sugar if I feel it is too high or too low -record values and write them down take them to all doctor visits  -schedule appointment with eye doctor -check feet daily for cuts, sores or redness -trim toenails straight across -wash and dry feet carefully every day -wear comfortable, cotton socks -wear comfortable, well-fitting shoes    Spoke with patient. She reports doing wonderful.  She says her blood pressure is better today at  143/80.  Discussed HTN Management and importance of monitoring blood pressure and writing down to take to PCP. She verbalized understanding. She is doing well with sugar management.  Blood sugar this am 94 and last A1c 6.4.  Discussed diabetes management and importance.  She verbalized understanding and voices no RN CM concerns.           Our next appointment is by telephone on 04/07/23 at 1200 pm  Please call the care guide team at [phone] if you need to cancel or reschedule your appointment.   If you are experiencing a Mental Health or Behavioral Health Crisis or need someone to talk to, please call the Suicide and Crisis Lifeline: 988   The patient verbalized understanding of instructions, educational materials, and care plan  provided today and agreed to receive a mailed copy of patient instructions, educational materials, and care plan.   The patient has been provided with contact information for the care management team and has been advised to call with any health related questions or concerns.   Bary Leriche, RN, MSN RN Care Manager [facility], Population Health Direct Dial: [phone]  Fax: [phone] Website: [email or web address]

## 2023-03-24 NOTE — Patient Outreach (Signed)
  Care Coordination   03/24/2023 Name: Lisa [name] MRN: [id number] DOB: 03/28/1951   Care Coordination Outreach Attempts:  An unsuccessful telephone outreach was attempted for a scheduled appointment today.  Follow Up Plan:  Additional outreach attempts will be made to offer the patient care coordination information and services.   Encounter Outcome:  No Answer   Care Coordination Interventions:  No, not indicated    Lisa Oyen Idelle Jo, RN, MSN RN Care Manager [facility], Population Health Direct Dial: [phone]  Fax: [phone] Website: [email or web address]

## 2023-03-25 ENCOUNTER — Other Ambulatory Visit: Payer: Medicare HMO

## 2023-03-30 ENCOUNTER — Ambulatory Visit: Payer: Medicare HMO | Admitting: Nurse Practitioner

## 2023-04-01 ENCOUNTER — Encounter: Payer: Self-pay | Admitting: Nurse Practitioner

## 2023-04-01 ENCOUNTER — Ambulatory Visit (INDEPENDENT_AMBULATORY_CARE_PROVIDER_SITE_OTHER): Payer: Medicare HMO | Admitting: Nurse Practitioner

## 2023-04-01 VITALS — BP 132/70 | HR 73 | Temp 97.2°F | Ht 65.0 in | Wt 149.8 lb

## 2023-04-01 DIAGNOSIS — M19041 Primary osteoarthritis, right hand: Secondary | ICD-10-CM | POA: Diagnosis not present

## 2023-04-01 DIAGNOSIS — L299 Pruritus, unspecified: Secondary | ICD-10-CM | POA: Diagnosis not present

## 2023-04-01 DIAGNOSIS — M19042 Primary osteoarthritis, left hand: Secondary | ICD-10-CM | POA: Diagnosis not present

## 2023-04-01 DIAGNOSIS — I10 Essential (primary) hypertension: Secondary | ICD-10-CM

## 2023-04-01 DIAGNOSIS — E1140 Type 2 diabetes mellitus with diabetic neuropathy, unspecified: Secondary | ICD-10-CM | POA: Diagnosis not present

## 2023-04-01 DIAGNOSIS — R6889 Other general symptoms and signs: Secondary | ICD-10-CM | POA: Diagnosis not present

## 2023-04-01 LAB — BASIC METABOLIC PANEL
BUN: 11 mg/dL (ref 6–23)
CO2: 28 meq/L (ref 19–32)
Calcium: 9.3 mg/dL (ref 8.4–10.5)
Chloride: 103 meq/L (ref 96–112)
Creatinine, Ser: 0.97 mg/dL (ref 0.40–1.20)
GFR: 58.73 mL/min — ABNORMAL LOW (ref >60.00)
Glucose, Bld: 135 mg/dL — ABNORMAL HIGH (ref 70–99)
Potassium: 4.3 meq/L (ref 3.5–5.1)
Sodium: 138 meq/L (ref 135–145)

## 2023-04-01 LAB — HEMOGLOBIN A1C: Hgb A1c MFr Bld: 6.9 % — ABNORMAL HIGH (ref 4.6–6.5)

## 2023-04-01 LAB — MICROALBUMIN / CREATININE URINE RATIO
Creatinine,U: 23.2 mg/dL
Microalb Creat Ratio: 3 mg/g (ref 0.0–30.0)
Microalb, Ur: <0.7 mg/dL (ref 0.0–1.9)

## 2023-04-01 MED ORDER — SARNA 0.5-0.5 % EX LOTN: 1.0000 | TOPICAL_LOTION | CUTANEOUS | 0 refills | Status: AC | PRN

## 2023-04-01 MED ORDER — TRAMADOL HCL 50 MG PO TABS
50.0000 mg | ORAL_TABLET | Freq: Every evening | ORAL | 0 refills | Status: AC | PRN
Start: 2023-04-01 — End: 2023-04-06

## 2023-04-01 NOTE — Assessment & Plan Note (Signed)
Chronic to multiple areas. She reports worsening shoulder pain, particularly at night, likely due to arthritis. We will prescribe Tramadol 50mg  as needed at bedtime. She is unable to take NSAIDS due to recent GI bleed and ulcer. She states tylenol did not help. PDMP reviewed.

## 2023-04-01 NOTE — Assessment & Plan Note (Signed)
She reports generalized itching, particularly at night, with no visible rash noted on examination. Start sarna lotion as needed

## 2023-04-01 NOTE — Assessment & Plan Note (Signed)
Chronic, stable. Check A1c today and urine microalbumin.  Continue controlling sugar with nutrition and limiting sugars.

## 2023-04-01 NOTE — Patient Instructions (Signed)
It was great to see you!  Start tramadol at bedtime as needed for pain   Keep taking your blood pressure medications   We are checking your labs today and will let you know the results via mychart/phone.   Let's follow-up in 3 months, sooner if you have concerns.  If a referral was placed today, you will be contacted for an appointment. Please note that routine referrals can sometimes take up to 3-4 weeks to process. Please call our office if you haven't heard anything after this time frame.  Take care,  Rodman Pickle, NP

## 2023-04-01 NOTE — Progress Notes (Signed)
Established Patient Office Visit  Subjective   Patient ID: Lisa [name], female    DOB: [birth year]  Age: 72 y.o. MRN: [id number]  Chief Complaint  Patient presents with   Hypertension    Follow up, no concerns    HPI  Discussed the use of AI scribe software for clinical note transcription with the patient, who gave verbal consent to proceed.  History of Present Illness   The patient, with a history of hypertension, heart failure, diabetes, and carpal tunnel syndrome, presents for a follow-up visit. She reports no issues with the increased dose of Entresto for hypertension. She denies chest pain and shortness of breath. Home blood pressure readings have improved, with a recent reading of 145/70, down from 200 systolic. She also reports intermittent abdominal discomfort, which is not as severe as before.  The patient has been checking her blood glucose levels at home, with the most recent reading being 100. She denies chest pain, shortness of breath, and urinating frequently.  She also reports itching, particularly around the right shoulder area, but no visible rash is noted. The patient has been using a prescribed medication for the itching, which recently ran out. This medication is not on her medication list and unable to verify which it was.   The patient also reports pain in the shoulder blade, which worsens at night and is associated with the itching. She has been experiencing difficulty sleeping due to the discomfort. She has tried OTC tylenol, but this does not help. She also recently had GI bleed and stomach ulcer so is not taking NSAIDs.        ROS See pertinent positives and negatives per HPI.    Objective:     BP 132/70 (BP Location: Left Arm, Cuff Size: Normal)   Pulse 73   Temp (!) 97.2 F (36.2 C)   Ht 5\' 5"  (1.651 m)   Wt 149 lb 12.8 oz (67.9 kg)   SpO2 96%   BMI 24.93 kg/m    Physical Exam Vitals and nursing note reviewed.  Constitutional:       General: She is not in acute distress.    Appearance: Normal appearance.  HENT:     Head: Normocephalic.  Eyes:     Conjunctiva/sclera: Conjunctivae normal.  Cardiovascular:     Rate and Rhythm: Normal rate and regular rhythm.     Pulses: Normal pulses.     Heart sounds: Normal heart sounds.  Pulmonary:     Effort: Pulmonary effort is normal.     Breath sounds: Normal breath sounds.  Musculoskeletal:     Cervical back: Normal range of motion.  Skin:    General: Skin is warm and dry.     Findings: No rash.  Neurological:     General: No focal deficit present.     Mental Status: She is alert and oriented to person, place, and time.  Psychiatric:        Mood and Affect: Mood normal.        Behavior: Behavior normal.        Thought Content: Thought content normal.        Judgment: Judgment normal.      Assessment & Plan:   Problem List Items Addressed This Visit       Cardiovascular and Mediastinum   Essential hypertension   Improved control of her hypertension is noted with the recent increase in Entresto, as evidenced by a decrease in home blood pressure readings from 200  to 132/70. We will continue the current dose of Entresto and check kidney function today due to the recent increase in dosage. Continue amlodipine 5mg  daily, coreg 12.5mg  bid, hydralazine 25mg  bid, and triamterine-hctz 37.5-25mg  daily.       Relevant Orders   Basic metabolic panel     Endocrine   Controlled type 2 diabetes with neuropathy (HCC) - Primary   Chronic, stable. Check A1c today and urine microalbumin.  Continue controlling sugar with nutrition and limiting sugars.       Relevant Orders   Hemoglobin A1c   Microalbumin / creatinine urine ratio     Musculoskeletal and Integument   Osteoarthritis   Chronic to multiple areas. She reports worsening shoulder pain, particularly at night, likely due to arthritis. We will prescribe Tramadol 50mg  as needed at bedtime. She is unable to take NSAIDS  due to recent GI bleed and ulcer. She states tylenol did not help. PDMP reviewed.       Relevant Medications   traMADol (ULTRAM) 50 MG tablet     Other   Itching   She reports generalized itching, particularly at night, with no visible rash noted on examination. Start sarna lotion as needed      Return in about 3 months (around 06/30/2023) for HTN.    Gerre Scull, NP

## 2023-04-01 NOTE — Assessment & Plan Note (Signed)
Improved control of her hypertension is noted with the recent increase in [place], as evidenced by a decrease in home blood pressure readings from 200 to 132/70. We will continue the current dose of Entresto and check kidney function today due to the recent increase in dosage. Continue amlodipine 5mg  daily, coreg 12.5mg  bid, hydralazine 25mg  bid, and triamterine-hctz 37.5-25mg  daily.

## 2023-04-07 ENCOUNTER — Ambulatory Visit: Payer: Self-pay

## 2023-04-07 NOTE — Patient Instructions (Signed)
Visit Information  Thank you for taking time to visit with me today. Please don't hesitate to contact me if I can be of assistance to you.   Following are the goals we discussed today:   Goals Addressed             This Visit's Progress    Diabetes and HTN Management       Patient Goals/Self Care Activities: -Patient/Caregiver will take medications as prescribed   -Patient/Caregiver will attend all scheduled provider appointments -Patient/Caregiver will call provider office for new concerns or questions   -Calls provider office for new concerns, questions, or BP outside discussed parameters -Checks BP and records as discussed -Follows a low sodium diet/DASH diet -check blood sugar at prescribed times -check blood sugar if I feel it is too high or too low -record values and write them down take them to all doctor visits  -schedule appointment with eye doctor -check feet daily for cuts, sores or redness -trim toenails straight across -wash and dry feet carefully every day -wear comfortable, cotton socks -wear comfortable, well-fitting shoes    Spoke with patient. She reports doing really good.  No complaints.  Blood sugar running 100-112.  Blood pressure today 145/70.  Advised on Diabetes and HTN Management.  She verbalized understanding and voices no RN CM concerns.           Our next appointment is by telephone on 05/06/23 at 1130 am  Please call the care guide team at [phone] if you need to cancel or reschedule your appointment.   If you are experiencing a Mental Health or Behavioral Health Crisis or need someone to talk to, please call the Suicide and Crisis Lifeline: 988   Patient verbalizes understanding of instructions and care plan provided today and agrees to view in MyChart. Active MyChart status and patient understanding of how to access instructions and care plan via MyChart confirmed with patient.     The patient has been provided with contact information  for the care management team and has been advised to call with any health related questions or concerns.   Bary Leriche, RN, MSN RN Care Manager [facility], Population Health Direct Dial: [phone]  Fax: [phone] Website: [email or web address]

## 2023-04-07 NOTE — Patient Outreach (Signed)
  Care Coordination   Follow Up Visit Note   04/07/2023 Name: Lisa [name] MRN: [id number] DOB: [birth year]  Lisa [name] is a 72 y.o. year old female who sees [name], [name], NP for primary care. I spoke with  Frazier Richards Magowan by phone today.  What matters to the patients health and wellness today?  Maintaining health    Goals Addressed             This Visit's Progress    Diabetes and HTN Management       Patient Goals/Self Care Activities: -Patient/Caregiver will take medications as prescribed   -Patient/Caregiver will attend all scheduled provider appointments -Patient/Caregiver will call provider office for new concerns or questions   -Calls provider office for new concerns, questions, or BP outside discussed parameters -Checks BP and records as discussed -Follows a low sodium diet/DASH diet -check blood sugar at prescribed times -check blood sugar if I feel it is too high or too low -record values and write them down take them to all doctor visits  -schedule appointment with eye doctor -check feet daily for cuts, sores or redness -trim toenails straight across -wash and dry feet carefully every day -wear comfortable, cotton socks -wear comfortable, well-fitting shoes    Spoke with patient. She reports doing really good.  No complaints.  Blood sugar running 100-112.  Blood pressure today 145/70.  Advised on Diabetes and HTN Management.  She verbalized understanding and voices no RN CM concerns.           SDOH assessments and interventions completed:  Yes      Care Coordination Interventions:  Yes, provided   Follow up plan: Follow up call scheduled for January    Encounter Outcome:  Patient Visit Completed    Bary Leriche, RN, MSN RN Care Manager [facility], Population Health Direct Dial: [phone]  Fax: [phone] Website: [email or web address]

## 2023-05-06 ENCOUNTER — Ambulatory Visit: Payer: Self-pay

## 2023-05-06 DIAGNOSIS — E1140 Type 2 diabetes mellitus with diabetic neuropathy, unspecified: Secondary | ICD-10-CM

## 2023-05-06 NOTE — Patient Outreach (Signed)
  Care Coordination   Follow Up Visit Note   05/06/2023 Name: Lisa [name] MRN: [id number] DOB: [birth year]  Lisa [name] is a 73 y.o. year old female who sees [name], [name], NP for primary care. I spoke with  Lisa [name] by phone today.  What matters to the patients health and wellness today?  Getting smoke detector in apartment    Goals Addressed             This Visit's Progress    Diabetes and HTN Management       Patient Goals/Self Care Activities: -Patient/Caregiver will take medications as prescribed   -Patient/Caregiver will attend all scheduled provider appointments -Patient/Caregiver will call provider office for new concerns or questions   -Calls provider office for new concerns, questions, or BP outside discussed parameters -Checks BP and records as discussed -Follows a low sodium diet/DASH diet -check blood sugar at prescribed times -check blood sugar if I feel it is too high or too low -record values and write them down take them to all doctor visits  -schedule appointment with eye doctor -check feet daily for cuts, sores or redness -trim toenails straight across -wash and dry feet carefully every day -wear comfortable, cotton socks -wear comfortable, well-fitting shoes    Spoke with patient. She reports doing really good.  She reports that she is in need of smoke detector in apartment.  Patient given information on [employer] to request smoke detectors.  She states she will call.  She also states she has no living room furniture and waiting assistance to get some.  Advised patient that CM would do social work referral for resources for furniture.  She verbalized understanding.  Blood sugar today 110.   Blood pressure today 135/60.  Reviewed Diabetes and HTN management.  She verbalized understanding and voices no RN CM concerns.           SDOH assessments and interventions completed:  Yes     Care Coordination  Interventions:  Yes, provided   Follow up plan: Follow up call scheduled for February    Encounter Outcome:  Patient Visit Completed   Lisa Leriche RN, MSN [facility], [facility] Health RN Care Manager Direct Dial: [phone]  Fax: [phone] Website: [email or web address]

## 2023-05-06 NOTE — Patient Instructions (Signed)
Visit Information  Thank you for taking time to visit with me today. Please don't hesitate to contact me if I can be of assistance to you.   Following are the goals we discussed today:   Goals Addressed             This Visit's Progress    Diabetes and HTN Management       Patient Goals/Self Care Activities: -Patient/Caregiver will take medications as prescribed   -Patient/Caregiver will attend all scheduled provider appointments -Patient/Caregiver will call provider office for new concerns or questions   -Calls provider office for new concerns, questions, or BP outside discussed parameters -Checks BP and records as discussed -Follows a low sodium diet/DASH diet -check blood sugar at prescribed times -check blood sugar if I feel it is too high or too low -record values and write them down take them to all doctor visits  -schedule appointment with eye doctor -check feet daily for cuts, sores or redness -trim toenails straight across -wash and dry feet carefully every day -wear comfortable, cotton socks -wear comfortable, well-fitting shoes    Spoke with patient. She reports doing really good.  She reports that she is in need of smoke detector in apartment.  Patient given information on [employer] to request smoke detectors.  She states she will call.  She also states she has no living room furniture and waiting assistance to get some.  Advised patient that CM would do social work referral for resources for furniture.  She verbalized understanding.  Blood sugar today 110.   Blood pressure today 135/60.  Reviewed Diabetes and HTN management.  She verbalized understanding and voices no RN CM concerns.           Our next appointment is by telephone on 06/03/23 at 1130 am  Please call the care guide team at [phone] if you need to cancel or reschedule your appointment.   If you are experiencing a Mental Health or Behavioral Health Crisis or need someone to talk  to, please call the Suicide and Crisis Lifeline: 988   Patient verbalizes understanding of instructions and care plan provided today and agrees to view in MyChart. Active MyChart status and patient understanding of how to access instructions and care plan via MyChart confirmed with patient.     The patient has been provided with contact information for the care management team and has been advised to call with any health related questions or concerns.   Bary Leriche RN, MSN [facility], [facility] Health RN Care Manager Direct Dial: [phone]  Fax: [phone] Website: [email or web address]

## 2023-05-07 ENCOUNTER — Telehealth: Payer: Self-pay | Admitting: *Deleted

## 2023-05-07 NOTE — Progress Notes (Signed)
Complex Care Management Care Guide Note  05/07/2023 Name: Danah Bern MRN: [id number] DOB: 1950/08/25  Ekam Kui Ferdig is a 73 y.o. year old female who is a primary care patient of [name], [name], NP and is actively engaged with the care management team. I reached out to [facility] by phone today to assist with scheduling  with the BSW.  Follow up plan: Telephone appointment with complex care management team member scheduled for:  05/26/23  Gwenevere Ghazi  [facility] Health  [facility], [facility] Guide  Direct Dial: [phone]  Fax [phone]

## 2023-05-11 ENCOUNTER — Telehealth: Payer: Self-pay

## 2023-05-11 DIAGNOSIS — E1140 Type 2 diabetes mellitus with diabetic neuropathy, unspecified: Secondary | ICD-10-CM

## 2023-05-11 NOTE — Telephone Encounter (Signed)
Referral placed.

## 2023-05-11 NOTE — Telephone Encounter (Signed)
---   Message -----  From: Fleeta Emmer, RN  Sent: 05/07/2023   3:16 PM EST  To: Gerre Scull, NP  Subject: Podiatrist referral                            Hello,   Can Ms. Reicks please be referred to a podiatrist?    Diagnosis: diabetes   Thanks,  Bary Leriche RN, MSN  [facility], [facility]  RN Care Manager  Direct Dial: [phone]  Fax: [phone]  Website: [email or web address]

## 2023-05-20 ENCOUNTER — Encounter: Payer: Self-pay | Admitting: Podiatry

## 2023-05-20 ENCOUNTER — Ambulatory Visit (INDEPENDENT_AMBULATORY_CARE_PROVIDER_SITE_OTHER): Payer: 59 | Admitting: Podiatry

## 2023-05-20 DIAGNOSIS — B351 Tinea unguium: Secondary | ICD-10-CM | POA: Diagnosis not present

## 2023-05-20 DIAGNOSIS — M79675 Pain in left toe(s): Secondary | ICD-10-CM | POA: Insufficient documentation

## 2023-05-20 DIAGNOSIS — M79674 Pain in right toe(s): Secondary | ICD-10-CM

## 2023-05-20 LAB — HM DIABETES FOOT EXAM

## 2023-05-20 NOTE — Progress Notes (Signed)
 This patient presents to the office with chief complaint of long thick nails and diabetic feet.  This patient  says there  is  no pain and discomfort in their feet.  This patient says there are long thick painful nails.  These nails are painful walking and wearing shoes.  Patient has no history of infection or drainage from both feet.  Patient is unable to  self treat his own nails . This patient presents  to the office today for treatment of the  long nails and a foot evaluation due to history of  diabetes.  General Appearance  Alert, conversant and in no acute stress.  Vascular  Dorsalis pedis and posterior tibial  pulses are  absent.  bilaterally.  Capillary return is within normal limits  bilaterally. Temperature is within normal limits  bilaterally.  Neurologic  Senn-Weinstein monofilament wire test within normal limits  bilaterally. Muscle power within normal limits bilaterally.  Nails Thick disfigured discolored nails with subungual debris  from hallux to fifth toes bilaterally. No evidence of bacterial infection or drainage bilaterally.  Orthopedic  No limitations of motion of motion feet .  No crepitus or effusions noted. HAV  B/L.  Skin  normotropic skin with no porokeratosis noted bilaterally.  No signs of infections or ulcers noted.     Onychomycosis  Diabetes with no foot complications  IE  Debride nails x 10.  A diabetic foot exam was performed and there is no evidence of any vascular or neurologic pathology.   RTC 3 months.   Cordella Bold DPM

## 2023-05-26 ENCOUNTER — Ambulatory Visit: Payer: Self-pay | Admitting: Licensed Clinical Social Worker

## 2023-05-27 NOTE — Patient Instructions (Signed)
Visit Information  Thank you for taking time to visit with me today. Please don't hesitate to contact me if I can be of assistance to you.   Following are the goals we discussed today:   Goals Addressed             This Visit's Progress    Patient Stated          Our next appointment is by telephone on 2/257/2025 at 1:30 pm  Please call the care guide team at [phone] if you need to cancel or reschedule your appointment.   If you are experiencing a Mental Health or Behavioral Health Crisis or need someone to talk to, please call the Suicide and Crisis Lifeline: 988 go to [facility] [address] [place] [phone]) call 911  The patient verbalized understanding of instructions, educational materials, and care plan provided today and DECLINED offer to receive copy of patient instructions, educational materials, and care plan.   Jeanie Cooks, PhD [facility], [facility] Social Worker Direct Dial: [phone]  Fax: [phone]

## 2023-05-27 NOTE — Patient Outreach (Signed)
  Care Coordination   Initial Visit Note   05/27/2023 Name: Lisa [name] MRN: [id number] DOB: June 05, 1950  Lisa [name] is [name] 73 y.o. year old female who sees [name], [name], NP for primary care. I spoke with  Lisa [name] by phone today.  What matters to the patients health and wellness today?  Furniture needed    Goals Addressed             This Visit's Progress    Patient Stated          SDOH assessments and interventions completed:  Yes  SDOH Interventions Today    Flowsheet Row Most Recent Value  SDOH Interventions   Food Insecurity Interventions Intervention Not Indicated  Housing Interventions Intervention Not Indicated  Transportation Interventions Intervention Not Indicated  Utilities Interventions Intervention Not Indicated        Care Coordination Interventions:  Yes, provided   Follow up plan: Follow up call scheduled for 06/11/2023 at 1:30 pm    Encounter Outcome:  Patient Visit Completed   Lisa Cooks, PhD [facility], [facility] Social Worker Direct Dial: [phone]  Fax: [phone]

## 2023-06-03 ENCOUNTER — Ambulatory Visit: Payer: Self-pay

## 2023-06-03 NOTE — Patient Outreach (Signed)
  Care Coordination   06/03/2023 Name: Lisa [name] MRN: [id number] DOB: Jul 15, 1950   Care Coordination Outreach Attempts:  An unsuccessful outreach was attempted for an appointment today.  Follow Up Plan:  Additional outreach attempts will be made to offer the patient complex care management information and services.   Encounter Outcome:  No Answer   Care Coordination Interventions:  No, not indicated    Bary Leriche RN, MSN [facility] Health  [facility], [facility] Health RN Care Manager Direct Dial: [phone]  Fax: [phone] Website: [email or web address]

## 2023-06-11 ENCOUNTER — Ambulatory Visit: Payer: Self-pay | Admitting: Licensed Clinical Social Worker

## 2023-06-11 ENCOUNTER — Telehealth: Payer: Self-pay

## 2023-06-11 NOTE — Patient Instructions (Signed)
 Visit Information  Thank you for taking time to visit with me today. Please don't hesitate to contact me if I can be of assistance to you.   Following are the goals we discussed today:   Goals Addressed             This Visit's Progress    Care Coordination Activities       Care Coordination Interventions: Patient stated that she needs som living room furniture the Sw discussed the [facility] but told the patient that she will be responsible for the delivery fee, the patient stated that she can't afford any delivery fees.  SW completed the SDOH and no other issues.  SW will follow up in two weeks        Our next appointment is by telephone on 07/01/2023 at 1:30 pm  Please call the care guide team at [phone] if you need to cancel or reschedule your appointment.   If you are experiencing a Mental Health or Behavioral Health Crisis or need someone to talk to, please call the Suicide and Crisis Lifeline: 988 go to [facility] [address], [place] [phone]) call 911  The patient verbalized understanding of instructions, educational materials, and care plan provided today and DECLINED offer to receive copy of patient instructions, educational materials, and care plan.   Jeanie Cooks, PhD [facility], [facility] Social Worker Direct Dial: [phone]  Fax: [phone]

## 2023-06-11 NOTE — Patient Outreach (Signed)
 Care Coordination   06/11/2023 Name: Jernie Schutt MRN: [id number] DOB: Apr 06, 1951   Care Coordination Outreach Attempts:  A second unsuccessful outreach was attempted today to offer the patient with information about available complex care management services.  Follow Up Plan:  Additional outreach attempts will be made to offer the patient complex care management information and services.   Encounter Outcome:  No Answer   Care Coordination Interventions:  No, not indicated    Bary Leriche RN, MSN [facility] Health  [facility], [facility] Health RN Care Manager Direct Dial: [phone]  Fax: [phone] Website: [email or web address]

## 2023-06-11 NOTE — Patient Outreach (Signed)
 Care Coordination   Follow Up Visit Note   06/11/2023 Name: Lisa [name] MRN: [id number] DOB: [birth year]  Lisa [name] is a 73 y.o. year old female who sees [name], [name], NP for primary care. I spoke with  Frazier Richards Martelli by phone today.  What matters to the patients health and wellness today?  Furniture    Goals Addressed             This Visit's Progress    Care Coordination Activities       Care Coordination Interventions: Patient stated that she needs som living room furniture the Sw discussed the [place] but told the patient that she will be responsible for the delivery fee, the patient stated that she can't afford any delivery fees.  SW completed the SDOH and no other issues.  SW will follow up in two weeks        SDOH assessments and interventions completed:  Yes  SDOH Interventions Today    Flowsheet Row Most Recent Value  SDOH Interventions   Food Insecurity Interventions Intervention Not Indicated  Housing Interventions Intervention Not Indicated  Transportation Interventions Intervention Not Indicated  Utilities Interventions Intervention Not Indicated        Care Coordination Interventions:  Yes, provided  Interventions Today    Flowsheet Row Most Recent Value  General Interventions   General Interventions Discussed/Reviewed General Interventions Reviewed, [employer] is still in need of furniture and does not have money to pay delivery fee for any free furniture]        Follow up plan: Follow up call scheduled for 07/01/2023 at   1:30 pm  Encounter Outcome:  Patient Visit Completed   Jeanie Cooks, PhD [facility], [facility] Social Worker Direct Dial: [phone]  Fax: [phone]

## 2023-06-12 ENCOUNTER — Ambulatory Visit: Payer: 59

## 2023-06-12 DIAGNOSIS — E2839 Other primary ovarian failure: Secondary | ICD-10-CM | POA: Diagnosis not present

## 2023-06-12 DIAGNOSIS — Z0000 Encounter for general adult medical examination without abnormal findings: Secondary | ICD-10-CM | POA: Diagnosis not present

## 2023-06-12 NOTE — Progress Notes (Signed)
 Subjective:   Lisa [name] is a 73 y.o. who presents for a Medicare Wellness preventive visit.  Visit Complete: Virtual I connected with  Lisa [name] on 06/12/23 by a audio enabled telemedicine application and verified that I am speaking with the correct person using two identifiers.  Patient Location: Home  Provider Location: Office/Clinic  I discussed the limitations of evaluation and management by telemedicine. The patient expressed understanding and agreed to proceed.  Vital Signs: Because this visit was a virtual/telehealth visit, some criteria may be missing or patient reported. Any vitals not documented were not able to be obtained and vitals that have been documented are patient reported.  VideoError- Librarian, academic were attempted between this provider and patient, however failed, due to patient having technical difficulties OR patient did not have access to video capability.  We continued and completed visit with audio only.   AWV Questionnaire: No: Patient Medicare AWV questionnaire was not completed prior to this visit.  Cardiac Risk Factors include: advanced age (>81men, >66 women);diabetes mellitus;dyslipidemia;hypertension     Objective:    Today's Vitals   There is no height or weight on file to calculate BMI.     06/12/2023    8:56 AM 09/29/2022    7:21 AM 09/24/2022   10:03 AM 09/23/2022    6:53 PM 06/09/2022    9:16 AM 05/04/2022   10:13 AM 10/30/2020    9:20 AM  Advanced Directives  Does Patient Have a Medical Advance Directive? No  No No No No No  Would patient like information on creating a medical advance directive? No - Patient declined No - Patient declined No - Patient declined   No - Patient declined No - Patient declined    Current Medications (verified) Outpatient Encounter Medications as of 06/12/2023  Medication Sig   Accu-Chek Softclix Lancets lancets Use as instructed   acetaminophen (TYLENOL) 500 MG  tablet Take 1,000 mg by mouth every 6 (six) hours as needed for moderate pain or mild pain.   Alcohol Swabs (DROPSAFE ALCOHOL PREP) 70 % PADS Apply topically.   amLODipine (NORVASC) 5 MG tablet Take 5 mg by mouth daily.   atorvastatin (LIPITOR) 40 MG tablet Take 40 mg by mouth daily.   Blood Glucose Monitoring Suppl (ACCU-CHEK AVIVA PLUS) w/Device KIT 1 each by Does not apply route daily.   camphor-menthol (SARNA) lotion Apply 1 Application topically as needed for itching.   carvedilol (COREG) 12.5 MG tablet Take 1 tablet (12.5 mg total) by mouth 2 (two) times daily with a meal.   cholecalciferol (VITAMIN D3) 25 MCG (1000 UNIT) tablet Take 1 tablet (1,000 Units total) by mouth in the morning and at bedtime.   clopidogrel (PLAVIX) 75 MG tablet Take 1 tablet (75 mg total) by mouth daily.   diclofenac Sodium (VOLTAREN) 1 % GEL APPLY 2 G TOPICALLY 4 (FOUR) TIMES DAILY.   DULoxetine (CYMBALTA) 30 MG capsule Take 1 capsule (30 mg total) by mouth daily.   glucose blood (ACCU-CHEK AVIVA PLUS) test strip Use as instructed   glucose blood (ONETOUCH ULTRA) test strip Use as instructed   hydrALAZINE (APRESOLINE) 25 MG tablet Take 25 mg by mouth 2 (two) times daily.   loratadine (CLARITIN) 10 MG tablet Take 1 tablet (10 mg total) by mouth daily.   melatonin 3 MG TABS tablet TAKE 1 TABLET AT BEDTIME   Multiple Vitamin (MULTIVITAMIN) tablet Take 1 tablet by mouth daily.   nortriptyline (PAMELOR) 10 MG capsule Take  1 capsule (10 mg total) by mouth at bedtime.   ondansetron (ZOFRAN) 4 MG tablet TAKE 1 TABLET EVERY 8 HOURS AS NEEDED FOR NAUSEA OR VOMITING (Patient taking differently: Take 4 mg by mouth every 8 (eight) hours as needed for nausea or vomiting.)   pantoprazole (PROTONIX) 40 MG tablet TAKE 1 TABLET TWICE DAILY   polyethylene glycol powder (GLYCOLAX/MIRALAX) 17 GM/SCOOP powder Take 17 g by mouth 2 (two) times daily as needed for mild constipation.   sacubitril-valsartan (ENTRESTO) 97-103 MG Take 1  tablet by mouth 2 (two) times daily.   tiZANidine (ZANAFLEX) 2 MG tablet Take 2 mg by mouth at bedtime.   triamterene-hydrochlorothiazide (MAXZIDE-25) 37.5-25 MG tablet TAKE 1 TABLET EVERY DAY   metFORMIN (GLUCOPHAGE) 500 MG tablet Take 500 mg by mouth daily. (Patient not taking: Reported on 06/12/2023)   No facility-administered encounter medications on file as of 06/12/2023.    Allergies (verified) Ace inhibitors, Aspirin, Codeine, and Hydrocodone   History: Past Medical History:  Diagnosis Date   ARF (acute renal failure) (HCC) 11/18/2018   Coronary artery disease    Diabetes mellitus (HCC) 2007   High cholesterol 2007   Hypertension    Peptic ulcer 1965   Stroke [facility])    Past Surgical History:  Procedure Laterality Date   BIOPSY  09/29/2022   Procedure: BIOPSY;  Surgeon: Benancio Deeds, MD;  Location: [facility];  Service: Gastroenterology;;   CESAREAN SECTION     COLONOSCOPY N/A 09/29/2022   Procedure: COLONOSCOPY;  Surgeon: Benancio Deeds, MD;  Location: [facility];  Service: Gastroenterology;  Laterality: N/A;   ESOPHAGOGASTRODUODENOSCOPY (EGD) WITH PROPOFOL N/A 05/04/2022   Procedure: ESOPHAGOGASTRODUODENOSCOPY (EGD) WITH PROPOFOL;  Surgeon: Benancio Deeds, MD;  Location: [facility] ENDOSCOPY;  Service: Gastroenterology;  Laterality: N/A;   ESOPHAGOGASTRODUODENOSCOPY (EGD) WITH PROPOFOL N/A 09/29/2022   Procedure: ESOPHAGOGASTRODUODENOSCOPY (EGD) WITH PROPOFOL;  Surgeon: Benancio Deeds, MD;  Location: [facility] ENDOSCOPY;  Service: Gastroenterology;  Laterality: N/A;   RIGHT/LEFT HEART CATH AND CORONARY ANGIOGRAPHY N/A 11/19/2018   Procedure: RIGHT/LEFT HEART CATH AND CORONARY ANGIOGRAPHY;  Surgeon: Orpah Cobb, MD;  Location: [facility] INVASIVE CV LAB;  Service: Cardiovascular;  Laterality: N/A;   Family History  Problem Relation Age of Onset   Kidney disease Father    Heart disease Father    Cancer Brother        Throat    Birth defects Son    Social History    Socioeconomic History   Marital status: Widowed    Spouse name: Mervin Cowing   Number of children: 2   Years of education: 11   Highest education level: Not on file  Occupational History   Occupation: Homemaker   Tobacco Use   Smoking status: Former    Current packs/day: 0.00    Average packs/day: 0.3 packs/day for 25.0 years (6.3 ttl pk-yrs)    Types: Cigarettes    Start date: 04/07/1988    Quit date: 04/07/2013    Years since quitting: 10.1   Smokeless tobacco: Former    Quit date: 04/07/2013   Tobacco comments:    2 cigs a day  Vaping Use   Vaping status: Never Used  Substance and Sexual Activity   Alcohol use: Not Currently    Alcohol/week: 2.0 standard drinks of alcohol    Types: 2 Cans of beer per week   Drug use: No   Sexual activity: Yes  Other Topics Concern   Not on file  Social History Narrative   Lives  with cousin. Was homeless for 1 year.Husband died 04/17/2012.    Social Drivers of Corporate investment banker Strain: Low Risk  (06/12/2023)   Overall Financial Resource Strain (CARDIA)    Difficulty of Paying Living Expenses: Not hard at all  Food Insecurity: No Food Insecurity (06/12/2023)   Hunger Vital Sign    Worried About Running Out of Food in the Last Year: Never true    Ran Out of Food in the Last Year: Never true  Transportation Needs: No Transportation Needs (06/12/2023)   PRAPARE - Administrator, Civil Service (Medical): No    Lack of Transportation (Non-Medical): No  Physical Activity: Insufficiently Active (06/12/2023)   Exercise Vital Sign    Days of Exercise per Week: 7 days    Minutes of Exercise per Session: 20 min  Stress: No Stress Concern Present (06/12/2023)   [employer] of Occupational Health - Occupational Stress Questionnaire    Feeling of Stress : Not at all  Social Connections: Socially Isolated (06/12/2023)   Social Connection and Isolation Panel [NHANES]    Frequency of Communication with Friends and  Family: More than three times a week    Frequency of Social Gatherings with Friends and Family: Three times a week    Attends Religious Services: Never    Active Member of Clubs or Organizations: No    Attends Banker Meetings: Never    Marital Status: Widowed    Tobacco Counseling Counseling given: Not Answered Tobacco comments: 2 cigs a day    Clinical Intake:  Pre-visit preparation completed: Yes  Pain : No/denies pain     Nutritional Risks: None Diabetes: Yes CBG done?: No Did pt. bring in CBG monitor from home?: No  How often do you need to have someone help you when you read instructions, pamphlets, or other written materials from your doctor or pharmacy?: 1 - Never  Interpreter Needed?: No  Information entered by :: NAllen LPN   Activities of Daily Living     06/12/2023    8:47 AM 09/24/2022    6:00 PM  In your present state of health, do you have any difficulty performing the following activities:  Hearing? 0 0  Vision? 0 0  Difficulty concentrating or making decisions? 0 0  Walking or climbing stairs? 0 1  Dressing or bathing? 0 0  Doing errands, shopping? 0 1  Preparing Food and eating ? N   Using the Toilet? N   In the past six months, have you accidently leaked urine? N   Do you have problems with loss of bowel control? N   Managing your Medications? N   Managing your Finances? N   Housekeeping or managing your Housekeeping? N     Patient Care Team: Gerre Scull, NP as PCP - General (Internal Medicine) Fleeta Emmer, RN as VBCI Care Management (General Practice) Remigio Eisenmenger D as Social Worker  Indicate any recent Medical Services you may have received from other than [facility] providers in the past year (date may be approximate).     Assessment:   This is a routine wellness examination for [place].  Hearing/Vision screen Hearing Screening - Comments:: Denies hearing issues Vision Screening - Comments:: No regular eye exams,  [facility]   Goals Addressed             This Visit's Progress    Patient Stated       06/12/2023, wants to grow old feeling good about herself  Depression Screen     06/12/2023    8:58 AM 03/24/2023   12:43 PM 03/09/2023   11:24 AM 01/07/2023    1:30 PM 11/21/2022   10:50 AM 09/02/2022   10:57 AM 06/09/2022    9:17 AM  PHQ 2/9 Scores  PHQ - 2 Score 0 0 0 0 0 0 0  PHQ- 9 Score 3  3 3   0     Fall Risk     06/12/2023    8:58 AM 03/24/2023   12:42 PM 03/09/2023   11:24 AM 12/10/2022    9:29 AM 11/21/2022   10:49 AM  Fall Risk   Falls in the past year? 0 1 1 0 0  Number falls in past yr: 0  1 0 0  Injury with Fall? 0 0 0 0 0  Risk for fall due to : Medication side effect  History of fall(s) No Fall Risks No Fall Risks  Follow up Falls prevention discussed;Falls evaluation completed  Falls evaluation completed Falls evaluation completed;Education provided;Falls prevention discussed Falls evaluation completed    MEDICARE RISK AT HOME:  Medicare Risk at Home Any stairs in or around the home?: No If so, are there any without handrails?: No Home free of loose throw rugs in walkways, pet beds, electrical cords, etc?: Yes Adequate lighting in your home to reduce risk of falls?: Yes Life alert?: No Use of a cane, walker or w/c?: No Grab bars in the bathroom?: No Shower chair or bench in shower?: No Elevated toilet seat or a handicapped toilet?: No  TIMED UP AND GO:  Was the test performed?  No  Cognitive Function: 6CIT completed        06/12/2023    9:01 AM 06/09/2022    9:17 AM  6CIT Screen  What Year? 0 points 0 points  What month? 0 points 0 points  What time? 0 points 0 points  Count back from 20 0 points 0 points  Months in reverse 0 points 0 points  Repeat phrase 6 points 6 points  Total Score 6 points 6 points    Immunizations Immunization History  Administered Date(s) Administered   Fluad Quad(high Dose 65+) 06/20/2020, 04/17/2022   Fluad  Trivalent(High Dose 65+) 01/07/2023   Influenza,inj,Quad PF,6+ Mos 01/20/2013   PFIZER Comirnaty(Gray Top)Covid-19 Tri-Sucrose Vaccine 06/20/2020, 07/12/2020   PNEUMOCOCCAL CONJUGATE-20 11/21/2022    Screening Tests Health Maintenance  Topic Date Due   OPHTHALMOLOGY EXAM  Never done   DTaP/Tdap/Td (1 - Tdap) Never done   Zoster Vaccines- Shingrix (1 of 2) Never done   DEXA SCAN  Never done   COVID-19 Vaccine (3 - Pfizer risk series) 08/09/2020   FOOT EXAM  04/18/2023   HEMOGLOBIN A1C  09/30/2023   Diabetic kidney evaluation - eGFR measurement  03/31/2024   Diabetic kidney evaluation - Urine ACR  03/31/2024   Medicare Annual Wellness (AWV)  06/11/2024   MAMMOGRAM  03/06/2025   Colonoscopy  09/28/2032   Pneumonia Vaccine 17+ Years old  Completed   INFLUENZA VACCINE  Completed   Hepatitis C Screening  Completed   HPV VACCINES  Aged Out    Health Maintenance  Health Maintenance Due  Topic Date Due   OPHTHALMOLOGY EXAM  Never done   DTaP/Tdap/Td (1 - Tdap) Never done   Zoster Vaccines- Shingrix (1 of 2) Never done   DEXA SCAN  Never done   COVID-19 Vaccine (3 - Pfizer risk series) 08/09/2020   FOOT EXAM  04/18/2023  Health Maintenance Items Addressed: DEXA ordered, Going to make an appointment for the eye doctor, due for TDAP, Shingles and Covid vaccines. Seen podiatrist on 05/20/2023  Additional Screening:  Vision Screening: Recommended annual ophthalmology exams for early detection of glaucoma and other disorders of the eye.  Dental Screening: Recommended annual dental exams for proper oral hygiene  Community Resource Referral / Chronic Care Management: CRR required this visit?  No   CCM required this visit?  No     Plan:     I have personally reviewed and noted the following in the patient's chart:   Medical and social history Use of alcohol, tobacco or illicit drugs  Current medications and supplements including opioid prescriptions. Patient is not currently  taking opioid prescriptions. Functional ability and status Nutritional status Physical activity Advanced directives List of other physicians Hospitalizations, surgeries, and ER visits in previous 12 months Vitals Screenings to include cognitive, depression, and falls Referrals and appointments  In addition, I have reviewed and discussed with patient certain preventive protocols, quality metrics, and best practice recommendations. A written personalized care plan for preventive services as well as general preventive health recommendations were provided to patient.     Barb Merino, LPN   1/61/0960   After Visit Summary: (Pick Up) Due to this being a telephonic visit, with patients personalized plan was offered to patient and patient has requested to Pick up at office.  Notes: Nothing significant to report at this time.

## 2023-06-12 NOTE — Patient Instructions (Signed)
 Ms. Syverson , Thank you for taking time to come for your Medicare Wellness Visit. I appreciate your ongoing commitment to your health goals. Please review the following plan we discussed and let me know if I can assist you in the future.   Referrals/Orders/Follow-Ups/Clinician Recommendations: bone density  You have an order for:  []   2D Mammogram  []   3D Mammogram  [x]   Bone Density     Please call for appointment:  [facility] [address] [place], [place] [address] [phone]     Make sure to wear two-piece clothing.  No lotions, powders, or deodorants the day of the appointment. Make sure to bring picture ID and insurance card.  Bring list of medications you are currently taking including any supplements.   Schedule your [place] screening mammogram through MyChart!   Log into your MyChart account.  Go to 'Visit' (or 'Appointments' if on mobile App) --> Schedule an Appointment  Under 'Select a Reason for Visit' choose the Mammogram Screening option.  Complete the pre-visit questions and select the time and place that best fits your schedule.    This is a list of the screening recommended for you and due dates:  Health Maintenance  Topic Date Due   Eye exam for diabetics  Never done   DTaP/Tdap/Td vaccine (1 - Tdap) Never done   Zoster (Shingles) Vaccine (1 of 2) Never done   DEXA scan (bone density measurement)  Never done   COVID-19 Vaccine (3 - Pfizer risk series) 08/09/2020   Complete foot exam   04/18/2023   Hemoglobin A1C  09/30/2023   Yearly kidney function blood test for diabetes  03/31/2024   Yearly kidney health urinalysis for diabetes  03/31/2024   Medicare Annual Wellness Visit  06/11/2024   Mammogram  03/06/2025   Colon Cancer Screening  09/28/2032   Pneumonia Vaccine  Completed   Flu Shot  Completed   Hepatitis C Screening  Completed   HPV Vaccine  Aged Out    Advanced directives: (ACP Link)Information on Advanced  Care Planning can be found at [facility] Advance Health Care Directives Advance Health Care Directives ([email or web address]   Next Medicare Annual Wellness Visit scheduled for next year: Yes  insert Preventive Care attachment Insert FALL PREVENTION attachment if needed

## 2023-06-18 ENCOUNTER — Inpatient Hospital Stay: Admission: RE | Admit: 2023-06-18 | Source: Ambulatory Visit

## 2023-06-19 ENCOUNTER — Other Ambulatory Visit: Payer: Self-pay | Admitting: Nurse Practitioner

## 2023-06-19 NOTE — Telephone Encounter (Signed)
 Copied from CRM [id number]. Topic: Clinical - Medication Refill >> Jun 19, 2023  2:06 PM Elizebeth Brooking wrote: Most Recent Primary Care Visit:  Provider: Barb Merino  Department: [facility]  Visit Type: MEDICARE AWV, SEQUENTIAL  Date: 06/12/2023  Medication: triamterene-hydrochlorothiazide (MAXZIDE-25) 37.5-25 MG table sacubitril-valsartan (ENTRESTO) 97-103 MG polyethylene glycol powder (GLYCOLAX/MIRALAX) 17 GM/SCOOP powder pantoprazole (PROTONIX) 40 MG tablet nortriptyline (PAMELOR) 10 MG capsule Multiple Vitamin (MULTIVITAMIN) tablet loratadine (CLARITIN) 10 MG tablet DULoxetine (CYMBALTA) 30 MG capsule  Has the patient contacted their pharmacy? Yes (Agent: If no, request that the patient contact the pharmacy for the refill. If patient does not wish to contact the pharmacy document the reason why and proceed with request.) (Agent: If yes, when and what did the pharmacy advise?)  Is this the correct pharmacy for this prescription? Yes If no, delete pharmacy and type the correct one.  This is the patient's preferred pharmacy:  [facility]  [address]  [place], [place] [address]  NCPDP ID: [id number]     Has the prescription been filled recently? No  Is the patient out of the medication? Yes  Has the patient been seen for an appointment in the last year OR does the patient have an upcoming appointment? Yes  Can we respond through MyChart? Yes  Agent: Please be advised that Rx refills may take up to 3 business days. We ask that you follow-up with your pharmacy.

## 2023-06-22 MED ORDER — PANTOPRAZOLE SODIUM 40 MG PO TBEC: 40.0000 mg | DELAYED_RELEASE_TABLET | Freq: Two times a day (BID) | ORAL | 3 refills | Status: DC

## 2023-06-22 MED ORDER — DULOXETINE HCL 30 MG PO CPEP: 30.0000 mg | ORAL_CAPSULE | Freq: Every day | ORAL | 0 refills | Status: DC

## 2023-06-22 MED ORDER — ONE-DAILY MULTI VITAMINS PO TABS: 1.0000 | ORAL_TABLET | Freq: Every day | ORAL | 3 refills | Status: AC

## 2023-06-22 MED ORDER — NORTRIPTYLINE HCL 10 MG PO CAPS: 10.0000 mg | ORAL_CAPSULE | Freq: Every day | ORAL | 0 refills | Status: DC

## 2023-06-22 MED ORDER — SACUBITRIL-VALSARTAN 97-103 MG PO TABS: 1.0000 | ORAL_TABLET | Freq: Two times a day (BID) | ORAL | 1 refills | Status: DC

## 2023-06-22 MED ORDER — TRIAMTERENE-HCTZ 37.5-25 MG PO TABS: 1.0000 | ORAL_TABLET | Freq: Every day | ORAL | 11 refills | Status: AC

## 2023-06-22 MED ORDER — POLYETHYLENE GLYCOL 3350 17 GM/SCOOP PO POWD: 17.0000 g | Freq: Two times a day (BID) | ORAL | 1 refills | Status: AC | PRN

## 2023-06-22 MED ORDER — LORATADINE 10 MG PO TABS: 10.0000 mg | ORAL_TABLET | Freq: Every day | ORAL | 0 refills | Status: AC

## 2023-06-25 ENCOUNTER — Other Ambulatory Visit

## 2023-06-29 ENCOUNTER — Telehealth: Payer: Self-pay

## 2023-06-29 NOTE — Patient Outreach (Signed)
 Care Coordination   06/29/2023 Name: Lisa [name] MRN: [id number] DOB: 14-Jul-1950   Care Coordination Outreach Attempts:  A third unsuccessful outreach was attempted today to offer the patient with information about available complex care management services.  Follow Up Plan:  No further outreach attempts will be made at this time. We have been unable to contact the patient to offer or enroll patient in complex care management services.  Encounter Outcome:  No Answer   Care Coordination Interventions:  No, not indicated    Bary Leriche RN, MSN [facility]  [facility], [facility] RN Care Manager Direct Dial: [phone]  Fax: [phone] Website: [email or web address]

## 2023-06-30 ENCOUNTER — Telehealth: Payer: Self-pay | Admitting: Nurse Practitioner

## 2023-06-30 ENCOUNTER — Ambulatory Visit: Payer: Medicare HMO | Admitting: Nurse Practitioner

## 2023-06-30 NOTE — Telephone Encounter (Signed)
 [facility] called and spoke to [place], [facility] about the refill(s) requested. Advised it was sent on 06/22/23.He says they have them on hold because they weren't able to reach the patient to verify insurance and address. I asked which phone number and the one he had the area code wasn't correct. I gave the correct number and he says they will try to reach out, but it may be faster if we would as well and have the patient call [facility] Delivery Customer [phone]. I called the patient, no answer, unable to leave a message due to mailbox is full. If patient returns the call, let her know to call [facility].

## 2023-06-30 NOTE — Telephone Encounter (Signed)
 Copied from CRM [id number]. Topic: Clinical - Medication Refill >> Jun 19, 2023  2:06 PM Elizebeth Brooking wrote: Most Recent Primary Care Visit:  Provider: Barb Merino  Department: [facility]  Visit Type: MEDICARE AWV, SEQUENTIAL  Date: 06/12/2023  Medication: triamterene-hydrochlorothiazide (MAXZIDE-25) 37.5-25 MG table sacubitril-valsartan (ENTRESTO) 97-103 MG polyethylene glycol powder (GLYCOLAX/MIRALAX) 17 GM/SCOOP powder pantoprazole (PROTONIX) 40 MG tablet nortriptyline (PAMELOR) 10 MG capsule Multiple Vitamin (MULTIVITAMIN) tablet loratadine (CLARITIN) 10 MG tablet DULoxetine (CYMBALTA) 30 MG capsule  Has the patient contacted their pharmacy? Yes (Agent: If no, request that the patient contact the pharmacy for the refill. If patient does not wish to contact the pharmacy document the reason why and proceed with request.) (Agent: If yes, when and what did the pharmacy advise?)  Is this the correct pharmacy for this prescription? Yes If no, delete pharmacy and type the correct one.  This is the patient's preferred pharmacy:  [facility]  [address]  [place], [place] [address]  NCPDP ID: [id number]     Has the prescription been filled recently? No  Is the patient out of the medication? Yes  Has the patient been seen for an appointment in the last year OR does the patient have an upcoming appointment? Yes  Can we respond through MyChart? Yes  Agent: Please be advised that Rx refills may take up to 3 business days. We ask that you follow-up with your pharmacy. >> Jun 30, 2023 11:10 AM Chantha C wrote: Patient does not have [facility], current insurance is [facility] [employer] ID# [id number] effective 04/15/2023. Per CAL, insurance has been verified correctly.  Patient [phone] has not heard from the office on medication refill request. Patient concerned about medications running out. Patient is unsure of where  the new pharmacy, because of the new insurance. Patient is asking to speak with CMA to clarify the medications. Please call back.

## 2023-07-01 ENCOUNTER — Ambulatory Visit: Payer: Self-pay | Admitting: Licensed Clinical Social Worker

## 2023-07-01 NOTE — Patient Outreach (Signed)
 Care Coordination   07/01/2023 Name: Lisa [name] MRN: [id number] DOB: [birth year]   Care Coordination Outreach Attempts:  An unsuccessful outreach was attempted for an appointment today.  Follow Up Plan:  Additional outreach attempts will be made to offer the patient complex care management information and services.   Encounter Outcome:  No Answer   Care Coordination Interventions:  No, not indicated    SW will make another attempt to call the patient   Jeanie Cooks, PhD [facility], [facility] Social Worker Direct Dial: [phone]  Fax: [phone]

## 2023-07-01 NOTE — Telephone Encounter (Signed)
 Spoke to Pt and informed her that [employer] is trying to contact her to verified address and insurance information. Pt verbalized understand and stated she will call today; contact information was provided below.

## 2023-07-09 ENCOUNTER — Ambulatory Visit: Payer: Self-pay | Admitting: Licensed Clinical Social Worker

## 2023-07-09 NOTE — Patient Outreach (Signed)
 Care Coordination   07/09/2023 Name: Lisa [name] MRN: [id number] DOB: December 05, 1950   Care Coordination Outreach Attempts:  An unsuccessful outreach was attempted for an appointment today.  Follow Up Plan:  No further outreach attempts will be made at this time. We have been unable to contact the patient to offer or enroll patient in complex care management services.  Encounter Outcome:  No Answer   Care Coordination Interventions:  No, not indicated    Sw attempted several times to reach the patient VM has been full or Sw left a message.   Jeanie Cooks, PhD [facility], [facility] Social Worker Direct Dial: [phone]  Fax: [phone]

## 2023-07-21 ENCOUNTER — Other Ambulatory Visit: Payer: Self-pay | Admitting: Nurse Practitioner

## 2023-07-21 NOTE — Telephone Encounter (Addendum)
 Requesting: Nortriptyline HCl 10 MG Oral Capsule  Last Visit: 04/01/2023 Next Visit: 06/13/2024 Last Refill: 06/22/2023  Please Advise    To pharmacy: Requesting 1 year supply

## 2023-08-11 ENCOUNTER — Other Ambulatory Visit: Payer: Self-pay | Admitting: Nurse Practitioner

## 2023-08-11 NOTE — Telephone Encounter (Signed)
 Refill request for   Entresto   LR 06/22/23, #60, 1 rf LOV  04/01/23 FOV  none scheduled.   Please review and advise.  Thanks.  Dm/cma

## 2023-08-19 ENCOUNTER — Ambulatory Visit (INDEPENDENT_AMBULATORY_CARE_PROVIDER_SITE_OTHER): Payer: 59 | Admitting: Podiatry

## 2023-08-19 ENCOUNTER — Encounter: Payer: Self-pay | Admitting: Podiatry

## 2023-08-19 DIAGNOSIS — B351 Tinea unguium: Secondary | ICD-10-CM

## 2023-08-19 DIAGNOSIS — E1140 Type 2 diabetes mellitus with diabetic neuropathy, unspecified: Secondary | ICD-10-CM

## 2023-08-19 DIAGNOSIS — M79674 Pain in right toe(s): Secondary | ICD-10-CM | POA: Diagnosis not present

## 2023-08-19 DIAGNOSIS — M79675 Pain in left toe(s): Secondary | ICD-10-CM | POA: Diagnosis not present

## 2023-08-19 NOTE — Progress Notes (Signed)
 This patient presents to the office with chief complaint of long thick nails and diabetic feet.  This patient  says there  is  no pain and discomfort in their feet.  This patient says there are long thick painful nails.  These nails are painful walking and wearing shoes.  Patient has no history of infection or drainage from both feet.  Patient is unable to  self treat his own nails . This patient presents  to the office today for treatment of the  long nails and a foot evaluation due to history of  diabetes.  General Appearance  Alert, conversant and in no acute stress.  Vascular  Dorsalis pedis and posterior tibial  pulses are  present  bilaterally.  Capillary return is within normal limits  bilaterally. Temperature is within normal limits  bilaterally.  Neurologic  Senn-Weinstein monofilament wire test within normal limits  bilaterally. Muscle power within normal limits bilaterally.  Nails Thick disfigured discolored nails with subungual debris  from hallux to fifth toes bilaterally. No evidence of bacterial infection or drainage bilaterally.  Orthopedic  No limitations of motion of motion feet .  No crepitus or effusions noted. HAV  B/L.  Skin  normotropic skin with no porokeratosis noted bilaterally.  No signs of infections or ulcers noted.     Onychomycosis  Diabetes with no foot complications  IE  Debride nails x 10.  A diabetic foot exam was performed and there is no evidence of any vascular or neurologic pathology.   RTC 3 months.   Ruffin Cotton DPM

## 2023-08-25 DIAGNOSIS — E119 Type 2 diabetes mellitus without complications: Secondary | ICD-10-CM | POA: Diagnosis not present

## 2023-08-25 DIAGNOSIS — I5022 Chronic systolic (congestive) heart failure: Secondary | ICD-10-CM | POA: Diagnosis not present

## 2023-08-25 DIAGNOSIS — I2510 Atherosclerotic heart disease of native coronary artery without angina pectoris: Secondary | ICD-10-CM | POA: Diagnosis not present

## 2023-08-25 DIAGNOSIS — I10 Essential (primary) hypertension: Secondary | ICD-10-CM | POA: Diagnosis not present

## 2023-09-01 ENCOUNTER — Other Ambulatory Visit: Payer: Self-pay | Admitting: Nurse Practitioner

## 2023-09-01 NOTE — Telephone Encounter (Signed)
 Requesting:  DULoxetine  HCl 30 MG Oral Capsule Delayed Release Particles  Last Visit: 04/01/2023 Next Visit: 09/15/2023 Last Refill: 06/22/23  Please Advise

## 2023-09-15 ENCOUNTER — Ambulatory Visit: Admitting: Nurse Practitioner

## 2023-09-17 ENCOUNTER — Telehealth: Payer: Self-pay

## 2023-09-17 ENCOUNTER — Ambulatory Visit: Admitting: Nurse Practitioner

## 2023-09-17 NOTE — Telephone Encounter (Signed)
 Patient has not called or arrived to office for appointment.

## 2023-09-17 NOTE — Telephone Encounter (Signed)
 Noted

## 2023-09-17 NOTE — Telephone Encounter (Signed)
 I called patient again to get her checked in for Mychart visit today at 920am and there was no answer and voicemail was not set up to leave a message.

## 2023-09-17 NOTE — Telephone Encounter (Signed)
 I called patient to get her checked in for her MyChart Video Visit and no answer and no voicemail set up.

## 2023-10-08 ENCOUNTER — Emergency Department (HOSPITAL_COMMUNITY)

## 2023-10-08 ENCOUNTER — Inpatient Hospital Stay (HOSPITAL_COMMUNITY)
Admission: EM | Admit: 2023-10-08 | Discharge: 2023-10-10 | DRG: 683 | Disposition: A | Discharge status: Home / Self Care | Attending: Internal Medicine | Admitting: Internal Medicine

## 2023-10-08 ENCOUNTER — Other Ambulatory Visit: Payer: Self-pay

## 2023-10-08 ENCOUNTER — Encounter (HOSPITAL_COMMUNITY): Payer: Self-pay

## 2023-10-08 DIAGNOSIS — E2839 Other primary ovarian failure: Secondary | ICD-10-CM

## 2023-10-08 DIAGNOSIS — E871 Hypo-osmolality and hyponatremia: Secondary | ICD-10-CM | POA: Diagnosis present

## 2023-10-08 DIAGNOSIS — I2510 Atherosclerotic heart disease of native coronary artery without angina pectoris: Secondary | ICD-10-CM | POA: Diagnosis present

## 2023-10-08 DIAGNOSIS — Z7984 Long term (current) use of oral hypoglycemic drugs: Secondary | ICD-10-CM

## 2023-10-08 DIAGNOSIS — N179 Acute kidney failure, unspecified: Secondary | ICD-10-CM | POA: Diagnosis present

## 2023-10-08 DIAGNOSIS — Z8673 Personal history of transient ischemic attack (TIA), and cerebral infarction without residual deficits: Secondary | ICD-10-CM | POA: Diagnosis not present

## 2023-10-08 DIAGNOSIS — E7800 Pure hypercholesterolemia, unspecified: Secondary | ICD-10-CM | POA: Diagnosis present

## 2023-10-08 DIAGNOSIS — E1140 Type 2 diabetes mellitus with diabetic neuropathy, unspecified: Secondary | ICD-10-CM | POA: Diagnosis present

## 2023-10-08 DIAGNOSIS — F17210 Nicotine dependence, cigarettes, uncomplicated: Secondary | ICD-10-CM | POA: Diagnosis present

## 2023-10-08 DIAGNOSIS — Z886 Allergy status to analgesic agent status: Secondary | ICD-10-CM | POA: Diagnosis not present

## 2023-10-08 DIAGNOSIS — E860 Dehydration: Secondary | ICD-10-CM | POA: Diagnosis present

## 2023-10-08 DIAGNOSIS — I110 Hypertensive heart disease with heart failure: Secondary | ICD-10-CM | POA: Diagnosis present

## 2023-10-08 DIAGNOSIS — Z79899 Other long term (current) drug therapy: Secondary | ICD-10-CM

## 2023-10-08 DIAGNOSIS — K279 Peptic ulcer, site unspecified, unspecified as acute or chronic, without hemorrhage or perforation: Secondary | ICD-10-CM

## 2023-10-08 DIAGNOSIS — Z888 Allergy status to other drugs, medicaments and biological substances status: Secondary | ICD-10-CM

## 2023-10-08 DIAGNOSIS — R296 Repeated falls: Secondary | ICD-10-CM | POA: Diagnosis present

## 2023-10-08 DIAGNOSIS — E861 Hypovolemia: Secondary | ICD-10-CM | POA: Diagnosis present

## 2023-10-08 DIAGNOSIS — Z8249 Family history of ischemic heart disease and other diseases of the circulatory system: Secondary | ICD-10-CM

## 2023-10-08 DIAGNOSIS — Z885 Allergy status to narcotic agent status: Secondary | ICD-10-CM | POA: Diagnosis not present

## 2023-10-08 DIAGNOSIS — K2970 Gastritis, unspecified, without bleeding: Secondary | ICD-10-CM | POA: Diagnosis not present

## 2023-10-08 DIAGNOSIS — I429 Cardiomyopathy, unspecified: Secondary | ICD-10-CM | POA: Diagnosis present

## 2023-10-08 DIAGNOSIS — Z841 Family history of disorders of kidney and ureter: Secondary | ICD-10-CM

## 2023-10-08 DIAGNOSIS — I10 Essential (primary) hypertension: Secondary | ICD-10-CM | POA: Diagnosis present

## 2023-10-08 DIAGNOSIS — Z802 Family history of malignant neoplasm of other respiratory and intrathoracic organs: Secondary | ICD-10-CM | POA: Diagnosis not present

## 2023-10-08 DIAGNOSIS — Z7902 Long term (current) use of antithrombotics/antiplatelets: Secondary | ICD-10-CM | POA: Diagnosis not present

## 2023-10-08 DIAGNOSIS — Z602 Problems related to living alone: Secondary | ICD-10-CM | POA: Diagnosis present

## 2023-10-08 DIAGNOSIS — I5032 Chronic diastolic (congestive) heart failure: Secondary | ICD-10-CM | POA: Diagnosis present

## 2023-10-08 DIAGNOSIS — M47812 Spondylosis without myelopathy or radiculopathy, cervical region: Secondary | ICD-10-CM | POA: Diagnosis present

## 2023-10-08 LAB — CBC WITH DIFFERENTIAL/PLATELET
Abs Immature Granulocytes: 0 10*3/uL (ref 0.00–0.07)
Basophils Absolute: 0 10*3/uL (ref 0.0–0.1)
Basophils Relative: 1 %
Eosinophils Absolute: 0.1 10*3/uL (ref 0.0–0.5)
Eosinophils Relative: 2 %
HCT: 33.1 % — ABNORMAL LOW (ref 36.0–46.0)
Hemoglobin: 11.2 g/dL — ABNORMAL LOW (ref 12.0–15.0)
Immature Granulocytes: 0 %
Lymphocytes Relative: 25 %
Lymphs Abs: 1.2 10*3/uL (ref 0.7–4.0)
MCH: 31.4 pg (ref 26.0–34.0)
MCHC: 33.8 g/dL (ref 30.0–36.0)
MCV: 92.7 fL (ref 80.0–100.0)
Monocytes Absolute: 0.5 10*3/uL (ref 0.1–1.0)
Monocytes Relative: 11 %
Neutro Abs: 2.9 10*3/uL (ref 1.7–7.7)
Neutrophils Relative %: 61 %
Platelets: 206 10*3/uL (ref 150–400)
RBC: 3.57 MIL/uL — ABNORMAL LOW (ref 3.87–5.11)
RDW: 12.4 % (ref 11.5–15.5)
WBC: 4.7 10*3/uL (ref 4.0–10.5)
nRBC: 0 % (ref 0.0–0.2)

## 2023-10-08 LAB — COMPREHENSIVE METABOLIC PANEL WITH GFR
ALT: 13 U/L (ref 0–44)
AST: 21 U/L (ref 15–41)
Albumin: 3.9 g/dL (ref 3.5–5.0)
Alkaline Phosphatase: 87 U/L (ref 38–126)
Anion gap: 3 — ABNORMAL LOW (ref 5–15)
BUN: 29 mg/dL — ABNORMAL HIGH (ref 8–23)
CO2: 20 mmol/L — ABNORMAL LOW (ref 22–32)
Calcium: 8 mg/dL — ABNORMAL LOW (ref 8.9–10.3)
Chloride: 107 mmol/L (ref 98–111)
Creatinine, Ser: 2.06 mg/dL — ABNORMAL HIGH (ref 0.44–1.00)
GFR, Estimated: 25 mL/min — ABNORMAL LOW (ref >60)
Glucose, Bld: 140 mg/dL — ABNORMAL HIGH (ref 70–99)
Potassium: 4.3 mmol/L (ref 3.5–5.1)
Sodium: 130 mmol/L — ABNORMAL LOW (ref 135–145)
Total Bilirubin: 1 mg/dL (ref 0.0–1.2)
Total Protein: 7.7 g/dL (ref 6.5–8.1)

## 2023-10-08 LAB — CBG MONITORING, ED: Glucose-Capillary: 147 mg/dL — ABNORMAL HIGH (ref 70–99)

## 2023-10-08 LAB — CK: Total CK: 186 U/L (ref 38–234)

## 2023-10-08 LAB — MAGNESIUM: Magnesium: 2.2 mg/dL (ref 1.7–2.4)

## 2023-10-08 MED ORDER — ONDANSETRON HCL 4 MG/2ML IJ SOLN: 4.0000 mg | Freq: Four times a day (QID) | INTRAMUSCULAR | Status: DC | PRN

## 2023-10-08 MED ORDER — AMLODIPINE BESYLATE 5 MG PO TABS
5.0000 mg | ORAL_TABLET | Freq: Every day | ORAL | Status: DC
  Administered 2023-10-09 – 2023-10-10 (×2): 5 mg via ORAL
  Filled 2023-10-08 (×2): qty 1

## 2023-10-08 MED ORDER — INSULIN ASPART 100 UNIT/ML IJ SOLN
0.0000 [IU] | Freq: Every day | INTRAMUSCULAR | Status: DC
  Administered 2023-10-09: 2 [IU] via SUBCUTANEOUS

## 2023-10-08 MED ORDER — ACETAMINOPHEN 650 MG RE SUPP: 650.0000 mg | Freq: Four times a day (QID) | RECTAL | Status: DC | PRN

## 2023-10-08 MED ORDER — LACTATED RINGERS IV SOLN: INTRAVENOUS | Status: AC

## 2023-10-08 MED ORDER — ACETAMINOPHEN 325 MG PO TABS: 650.0000 mg | ORAL_TABLET | Freq: Four times a day (QID) | ORAL | Status: DC | PRN

## 2023-10-08 MED ORDER — CLOPIDOGREL BISULFATE 75 MG PO TABS
75.0000 mg | ORAL_TABLET | Freq: Every day | ORAL | Status: DC
  Administered 2023-10-09 – 2023-10-10 (×2): 75 mg via ORAL
  Filled 2023-10-08 (×2): qty 1

## 2023-10-08 MED ORDER — HEPARIN SODIUM (PORCINE) 5000 UNIT/ML IJ SOLN
5000.0000 [IU] | Freq: Three times a day (TID) | INTRAMUSCULAR | Status: DC
  Administered 2023-10-08 – 2023-10-10 (×5): 5000 [IU] via SUBCUTANEOUS
  Filled 2023-10-08 (×5): qty 1

## 2023-10-08 MED ORDER — SODIUM CHLORIDE 0.9 % IV BOLUS
1000.0000 mL | Freq: Once | INTRAVENOUS | Status: AC
  Administered 2023-10-08: 1000 mL via INTRAVENOUS

## 2023-10-08 MED ORDER — DULOXETINE HCL 30 MG PO CPEP
30.0000 mg | ORAL_CAPSULE | Freq: Every day | ORAL | Status: DC
  Administered 2023-10-09 – 2023-10-10 (×2): 30 mg via ORAL
  Filled 2023-10-08 (×2): qty 1

## 2023-10-08 MED ORDER — ATORVASTATIN CALCIUM 40 MG PO TABS
40.0000 mg | ORAL_TABLET | Freq: Every day | ORAL | Status: DC
  Administered 2023-10-09 – 2023-10-10 (×2): 40 mg via ORAL
  Filled 2023-10-08 (×2): qty 1

## 2023-10-08 MED ORDER — ONDANSETRON HCL 4 MG PO TABS: 4.0000 mg | ORAL_TABLET | Freq: Four times a day (QID) | ORAL | Status: DC | PRN

## 2023-10-08 MED ORDER — CARVEDILOL 12.5 MG PO TABS
12.5000 mg | ORAL_TABLET | Freq: Two times a day (BID) | ORAL | Status: DC
  Administered 2023-10-09 – 2023-10-10 (×3): 12.5 mg via ORAL
  Filled 2023-10-08 (×3): qty 1

## 2023-10-08 MED ORDER — PANTOPRAZOLE SODIUM 40 MG PO TBEC
40.0000 mg | DELAYED_RELEASE_TABLET | Freq: Two times a day (BID) | ORAL | Status: DC
  Administered 2023-10-09 – 2023-10-10 (×4): 40 mg via ORAL
  Filled 2023-10-08 (×4): qty 1

## 2023-10-08 MED ORDER — INSULIN ASPART 100 UNIT/ML IJ SOLN
0.0000 [IU] | Freq: Three times a day (TID) | INTRAMUSCULAR | Status: DC
  Administered 2023-10-09: 1 [IU] via SUBCUTANEOUS

## 2023-10-08 NOTE — ED Triage Notes (Signed)
 Pt coming in with dizziness with standing in her home where she is without A/C pt has frequent falls at baseline and is complaining of bilateral knee pain.   Bp 130/90  Hr 75 Spo2 96  Cbg 98

## 2023-10-08 NOTE — H&P (Signed)
 History and Physical    Lisa [name] FMW:[id number] DOB: 1950-09-12 DOA: 10/08/2023  PCP: [facility] [address]   Patient coming from: Home   Chief Complaint: Lightheaded on standing, b/l knee pain from a fall  HPI: Lisa [name] is a 73 y.o. female with medical history significant for hypertension, hyperlipidemia, diabetes mellitus, CAD, history of CVA, and cardiomyopathy with recovered EF who presents with lightheadedness on standing and bilateral knee pain from a fall.  Patient complains of insidious development of lightheadedness upon standing which has been worsening over the course of weeks.  This has resulted in multiple falls and she complains of bilateral knee pain related to this.  She denies hitting her head or experiencing head or neck pain.  She denies chest pain.  She has been taking all of her medications as directed.  She denies nausea, vomiting, or diarrhea but has had decreased appetite in recent weeks.  ED Course: Upon arrival to the ED, patient is found to be afebrile and saturating well on room air with normal HR and stable BP.  Labs are most notable for sodium 130, creatinine 2.06, normal WBC, and normal CK.  There are no acute findings on head CT or cervical spine CT.  Plain radiographs of the bilateral knees are negative for acute fracture or dislocation.  Patient was given a liter of NS.  Review of Systems:  All other systems reviewed and apart from HPI, are negative.  Past Medical History:  Diagnosis Date   ARF (acute renal failure) (HCC) 11/18/2018   Coronary artery disease    Diabetes mellitus (HCC) 2007   High cholesterol 2007   Hypertension    Peptic ulcer 1965   Stroke [facility])     Past Surgical History:  Procedure Laterality Date   BIOPSY  09/29/2022   Procedure: BIOPSY;  Surgeon: Leigh Elspeth SQUIBB, MD;  Location: [facility];  Service: Gastroenterology;;   CESAREAN SECTION     COLONOSCOPY N/A 09/29/2022   Procedure: COLONOSCOPY;  Surgeon:  Leigh Elspeth SQUIBB, MD;  Location: [facility];  Service: Gastroenterology;  Laterality: N/A;   ESOPHAGOGASTRODUODENOSCOPY (EGD) WITH PROPOFOL  N/A 05/04/2022   Procedure: ESOPHAGOGASTRODUODENOSCOPY (EGD) WITH PROPOFOL ;  Surgeon: Leigh Elspeth SQUIBB, MD;  Location: [facility];  Service: Gastroenterology;  Laterality: N/A;   ESOPHAGOGASTRODUODENOSCOPY (EGD) WITH PROPOFOL  N/A 09/29/2022   Procedure: ESOPHAGOGASTRODUODENOSCOPY (EGD) WITH PROPOFOL ;  Surgeon: Leigh Elspeth SQUIBB, MD;  Location: [facility];  Service: Gastroenterology;  Laterality: N/A;   RIGHT/LEFT HEART CATH AND CORONARY ANGIOGRAPHY N/A 11/19/2018   Procedure: RIGHT/LEFT HEART CATH AND CORONARY ANGIOGRAPHY;  Surgeon: Claudene Pacific, MD;  Location: [facility];  Service: Cardiovascular;  Laterality: N/A;    Social History:   reports that she quit smoking about 10 years ago. Her smoking use included cigarettes. She started smoking about 35 years ago. She has a 6.3 pack-year smoking history. She quit smokeless tobacco use about 10 years ago. She reports that she does not currently use alcohol after a past usage of about 2.0 standard drinks of alcohol per week. She reports that she does not use drugs.  Allergies  Allergen Reactions   Ace Inhibitors     Elevated Cr for 0.8 to 1.4 (30 percent or greater rise in serum creatinine)    Aspirin  Other (See Comments)    Irritates ulcers   Codeine     upsets my ulcers   Hydrocodone     upsets my ulcers    Family History  Problem Relation Age of  Onset   Kidney disease Father    Heart disease Father    Cancer Brother        Throat    Birth defects Son      Prior to Admission medications   Medication Sig Start Date End Date Taking? Authorizing Provider  Accu-Chek Softclix Lancets lancets Use as instructed 05/19/22   [name], [name], NP  acetaminophen  (TYLENOL ) 500 MG tablet Take 1,000 mg by mouth every 6 (six) hours as needed for moderate pain or mild pain.    [provider]  Alcohol Swabs (DROPSAFE ALCOHOL PREP) 70 % PADS Apply topically. 05/29/22   [provider]  amLODipine  (NORVASC ) 5 MG tablet Take 5 mg by mouth daily. 11/03/22   [provider]  atorvastatin  (LIPITOR) 40 MG tablet Take 40 mg by mouth daily.    [provider]  Blood Glucose Monitoring Suppl (ACCU-CHEK AVIVA PLUS) w/Device KIT 1 each by Does not apply route daily. 05/09/22   [name], Tinnie LABOR, NP  camphor-menthol (SARNA) lotion Apply 1 Application topically as needed for itching. 04/01/23   [name], Tinnie LABOR, NP  carvedilol  (COREG ) 12.5 MG tablet Take 1 tablet (12.5 mg total) by mouth 2 (two) times daily with a meal. 09/25/22   Jens Durand, MD  cholecalciferol (VITAMIN D3) 25 MCG (1000 UNIT) tablet Take 1 tablet (1,000 Units total) by mouth in the morning and at bedtime. 09/25/22   Jens Durand, MD  clopidogrel  (PLAVIX ) 75 MG tablet Take 1 tablet (75 mg total) by mouth daily. 05/07/22   Ghimire, Donalda HERO, MD  diclofenac  Sodium (VOLTAREN ) 1 % GEL APPLY 2 G TOPICALLY 4 (FOUR) TIMES DAILY. 12/19/22   [name], Tinnie LABOR, NP  DULoxetine  (CYMBALTA ) 30 MG capsule TAKE 1 CAPSULE BY MOUTH DAILY 09/01/23   [name], [name], NP  ENTRESTO  97-103 MG TAKE 1 TABLET BY MOUTH TWICE  DAILY 08/11/23   [name], [name], NP  glucose blood (ACCU-CHEK AVIVA PLUS) test strip Use as instructed 05/19/22   [name], [name], NP  glucose blood (ONETOUCH ULTRA) test strip Use as instructed 04/17/22   [name], [name], NP  hydrALAZINE  (APRESOLINE ) 25 MG tablet Take 25 mg by mouth 2 (two) times daily. 10/15/22   [provider]  loratadine  (CLARITIN ) 10 MG tablet Take 1 tablet (10 mg total) by mouth daily. 06/22/23   [name], [name], NP  melatonin 3 MG TABS tablet TAKE 1 TABLET AT BEDTIME 03/17/22   Ganta, Anupa, DO  metFORMIN  (GLUCOPHAGE ) 500 MG tablet Take 500 mg by mouth daily. 12/25/22   [provider]  Multiple Vitamin (MULTIVITAMIN) tablet Take 1 tablet by mouth daily.  06/22/23   [name], [name], NP  nortriptyline  (PAMELOR ) 10 MG capsule TAKE 1 CAPSULE BY MOUTH AT  BEDTIME 07/21/23   [name], [name], NP  ondansetron  (ZOFRAN ) 4 MG tablet TAKE 1 TABLET EVERY 8 HOURS AS NEEDED FOR NAUSEA OR VOMITING Patient taking differently: Take 4 mg by mouth every 8 (eight) hours as needed for nausea or vomiting. 06/03/22   [name], [name], NP  pantoprazole  (PROTONIX ) 40 MG tablet Take 1 tablet (40 mg total) by mouth 2 (two) times daily. 06/22/23   [name], [name], NP  polyethylene glycol powder (GLYCOLAX /MIRALAX ) 17 GM/SCOOP powder Take 17 g by mouth 2 (two) times daily as needed for mild constipation. 06/22/23   [name], [name], NP  tiZANidine (ZANAFLEX) 2 MG tablet Take 2 mg by mouth at bedtime. 12/17/22   [provider]  triamterene -hydrochlorothiazide  (MAXZIDE-25) 37.5-25 MG tablet Take 1 tablet by mouth daily. 06/22/23   Nedra Tinnie LABOR, NP    Physical Exam: Vitals:   10/08/23 1400 10/08/23 1407 10/08/23 1756 10/08/23 1758  BP: (!) 150/70  (!) 171/75 (!) 151/84  Pulse: 74  75 72  Resp: 16  16 16   Temp: 98.1 F (36.7 C)  98.2 F (36.8 C)   TempSrc: Oral     SpO2: 98%  98% 99%  Weight:  67 kg    Height:  5' 5 (1.651 m)      Constitutional: NAD, calm  Eyes: PERTLA, lids and conjunctivae normal ENMT: Mucous membranes are moist. Posterior pharynx clear of any exudate or lesions.   Neck: supple, no masses  Respiratory: no wheezing, no crackles. No accessory muscle use.  Cardiovascular: S1 & S2 heard, regular rate and rhythm. No extremity edema.   Abdomen: No tenderness, soft. Bowel sounds active.  Musculoskeletal: no clubbing / cyanosis. No joint deformity upper and lower extremities.   Skin: no significant rashes, lesions, ulcers. Warm, dry, well-perfused. Poor turgor.  Neurologic: CN 2-12 grossly intact. Moving all extremities. Alert and oriented.  Psychiatric: Pleasant. Cooperative.    Labs and Imaging on Admission: I have personally reviewed  following labs and imaging studies  CBC: Recent Labs  Lab 10/08/23 1806  WBC 4.7  NEUTROABS 2.9  HGB 11.2*  HCT 33.1*  MCV 92.7  PLT 206   Basic Metabolic Panel: Recent Labs  Lab 10/08/23 1806  NA 130*  K 4.3  CL 107  CO2 20*  GLUCOSE 140*  BUN 29*  CREATININE 2.06*  CALCIUM  8.0*  MG 2.2   GFR: Estimated Creatinine Clearance: 22.2 mL/min (A) (by C-G formula based on SCr of 2.06 mg/dL (H)). Liver Function Tests: Recent Labs  Lab 10/08/23 1806  AST 21  ALT 13  ALKPHOS 87  BILITOT 1.0  PROT 7.7  ALBUMIN 3.9   No results for input(s): LIPASE, AMYLASE in the last 168 hours. No results for input(s): AMMONIA in the last 168 hours. Coagulation Profile: No results for input(s): INR, PROTIME in the last 168 hours. Cardiac Enzymes: Recent Labs  Lab 10/08/23 1800  CKTOTAL 186   BNP (last 3 results) No results for input(s): PROBNP in the last 8760 hours. HbA1C: No results for input(s): HGBA1C in the last 72 hours. CBG: No results for input(s): GLUCAP in the last 168 hours. Lipid Profile: No results for input(s): CHOL, HDL, LDLCALC, TRIG, CHOLHDL, LDLDIRECT in the last 72 hours. Thyroid  Function Tests: No results for input(s): TSH, T4TOTAL, FREET4, T3FREE, THYROIDAB in the last 72 hours. Anemia Panel: No results for input(s): VITAMINB12, FOLATE, FERRITIN, TIBC, IRON, RETICCTPCT in the last 72 hours. Urine analysis:    Component Value Date/Time   COLORURINE YELLOW 09/24/2022 0000   APPEARANCEUR HAZY (A) 09/24/2022 0000   LABSPEC 1.008 09/24/2022 0000   PHURINE 5.0 09/24/2022 0000   GLUCOSEU NEGATIVE 09/24/2022 0000   HGBUR NEGATIVE 09/24/2022 0000   BILIRUBINUR Negative 03/09/2023 1121   KETONESUR NEGATIVE 09/24/2022 0000   PROTEINUR Negative 03/09/2023 1121   PROTEINUR NEGATIVE 09/24/2022 0000   UROBILINOGEN 0.2 03/09/2023 1121   UROBILINOGEN 0.2 02/13/2010 0943   NITRITE Negative 03/09/2023 1121    NITRITE NEGATIVE 09/24/2022 0000   LEUKOCYTESUR Negative 03/09/2023 1121   LEUKOCYTESUR NEGATIVE 09/24/2022 0000   Sepsis Labs: @LABRCNTIP (procalcitonin:4,lacticidven:4) )No results found for this or any previous visit (from the past 240 hours).   Radiological Exams on Admission: CT Head Wo Contrast  Result Date: 10/08/2023 CLINICAL DATA:  Head trauma EXAM: CT HEAD WITHOUT CONTRAST CT CERVICAL SPINE WITHOUT CONTRAST TECHNIQUE: Multidetector CT imaging of the head and cervical spine was performed following the standard protocol without intravenous contrast. Multiplanar CT image reconstructions of the cervical spine were also generated. RADIATION DOSE REDUCTION: This exam was performed according to the departmental dose-optimization program which includes automated exposure control, adjustment of the mA and/or kV according to patient size and/or use of iterative reconstruction technique. COMPARISON:  09/23/2022 FINDINGS: CT HEAD FINDINGS Brain: No evidence of acute infarction, hemorrhage, hydrocephalus, extra-axial collection or mass lesion/mass effect. Vascular: No hyperdense vessel or unexpected calcification. Skull: Normal. Negative for fracture or focal lesion. Sinuses/Orbits: No acute finding. Other: None. CT CERVICAL SPINE FINDINGS Alignment: Normal. Skull base and vertebrae: No acute fracture. No primary bone lesion or focal pathologic process. Soft tissues and spinal canal: No prevertebral fluid or swelling. No visible canal hematoma. Disc levels: Moderate multilevel cervical disc degenerative disease with large anterior bridging osteophytes. Upper chest: Negative. Other: None. IMPRESSION: 1. No acute intracranial pathology. 2. No fracture or static subluxation of the cervical spine. 3. Moderate multilevel cervical disc degenerative disease with large anterior bridging osteophytes. Electronically Signed   By: Marolyn JONETTA Jaksch M.D.   On: 10/08/2023 19:31   CT Cervical Spine Wo Contrast Result Date:  10/08/2023 CLINICAL DATA:  Head trauma EXAM: CT HEAD WITHOUT CONTRAST CT CERVICAL SPINE WITHOUT CONTRAST TECHNIQUE: Multidetector CT imaging of the head and cervical spine was performed following the standard protocol without intravenous contrast. Multiplanar CT image reconstructions of the cervical spine were also generated. RADIATION DOSE REDUCTION: This exam was performed according to the departmental dose-optimization program which includes automated exposure control, adjustment of the mA and/or kV according to patient size and/or use of iterative reconstruction technique. COMPARISON:  09/23/2022 FINDINGS: CT HEAD FINDINGS Brain: No evidence of acute infarction, hemorrhage, hydrocephalus, extra-axial collection or mass lesion/mass effect. Vascular: No hyperdense vessel or unexpected calcification. Skull: Normal. Negative for fracture or focal lesion. Sinuses/Orbits: No acute finding. Other: None. CT CERVICAL SPINE FINDINGS Alignment: Normal. Skull base and vertebrae: No acute fracture. No primary bone lesion or focal pathologic process. Soft tissues and spinal canal: No prevertebral fluid or swelling. No visible canal hematoma. Disc levels: Moderate multilevel cervical disc degenerative disease with large anterior bridging osteophytes. Upper chest: Negative. Other: None. IMPRESSION: 1. No acute intracranial pathology. 2. No fracture or static subluxation of the cervical spine. 3. Moderate multilevel cervical disc degenerative disease with large anterior bridging osteophytes. Electronically Signed   By: Marolyn JONETTA Jaksch M.D.   On: 10/08/2023 19:31   DG Knee Complete 4 Views Right Result Date: 10/08/2023 CLINICAL DATA:  Bilateral knee pain after fall. EXAM: RIGHT KNEE - COMPLETE 4+ VIEW COMPARISON:  None Available. FINDINGS: No evidence of fracture, dislocation, or joint effusion. No evidence of arthropathy or other focal bone abnormality. Soft tissues are unremarkable. IMPRESSION: Negative. Electronically Signed    By: Lynwood Landy Raddle M.D.   On: 10/08/2023 16:30   DG Knee Complete 4 Views Left Result Date: 10/08/2023 CLINICAL DATA:  Bilateral knee pain after fall. EXAM: LEFT KNEE - COMPLETE 4+ VIEW COMPARISON:  None Available. FINDINGS: No evidence of fracture, dislocation, or joint effusion. No evidence of arthropathy or other focal bone abnormality. Soft tissues are unremarkable. IMPRESSION: Negative. Electronically Signed   By: Lynwood Landy Raddle M.D.   On: 10/08/2023 16:29    EKG: Independently reviewed. Sinus rhythm, PVCs.   Assessment/Plan   1.  AKI  - Likely related to loss of appetite with hypovolemia and continued use of Entresto  and diuretics  - Hold Entresto , Maxzide, and metformin , continue IVF hydration, renally-dose medications, repeat chem panel in am    2. Chronic diastolic CHF  - Appears hypovolemic    - Hold Entresto  and diuretics, continue Coreg , monitor volume status    3. Hypertension  - Continue Norvasc  and Coreg , hold Maxzide   4. Type II DM  - Hold metformin , check CBGs, and use low-intensity SSI for now    5. Hx of CVA  - Lipitor, Plavix     6. Hyponatremia  - Serum sodium is 130 in setting of hypovolemia  - Continue isotonic IVF, repeat chem panel in am     DVT prophylaxis: sq heparin   Code Status: Full   Level of Care: Level of care: Med-Surg Family Communication: None present   Disposition Plan:  Patient is from: home Anticipated d/c is to: TBD Anticipated d/c date is: 10/11/23  Patient currently: Pending improved renal function, PT eval, disposition planning Consults called: None  Admission status: Inpatient     Evalene GORMAN Sprinkles, MD [facility]  10/08/2023, 8:11 PM

## 2023-10-08 NOTE — ED Provider Notes (Signed)
 [place] EMERGENCY DEPARTMENT AT [facility] Provider Note  CSN: [id number] Arrival date & time: 10/08/23 1356  Chief Complaint(s) Knee Pain  HPI Lisa [name] is a 73 y.o. female history of diabetes, coronary artery disease, hypertension, lipidemia presenting to the emergency department with lightheadedness.  Patient reports that she has been having lightheadedness and dizziness recently, has been at home without [facility].  Reports frequent falls and did strike her head once.  Worse with standing.  Denies any chest pain, shortness of breath, abdominal pain, back pain, neck pain.  No loss of consciousness.  No diarrhea.  Symptoms mild.   Past Medical History Past Medical History:  Diagnosis Date   ARF (acute renal failure) (HCC) 11/18/2018   Coronary artery disease    Diabetes mellitus (HCC) 2007   High cholesterol 2007   Hypertension    Peptic ulcer 1965   Stroke [facility]    Patient Active Problem List   Diagnosis Date Noted   Hyponatremia 10/08/2023   Pain due to onychomycosis of toenails of both feet 05/20/2023   Itching 04/01/2023   LLQ pain 03/09/2023   Constipation 03/09/2023   PUD (peptic ulcer disease) 09/29/2022   History of Helicobacter pylori infection 09/29/2022   Degenerative joint disease of cervical spine 09/25/2022   Acute kidney injury (HCC) 09/24/2022   AKI (acute kidney injury) (HCC) 09/24/2022   Dizziness 06/19/2022   Upper GI bleed 05/04/2022   Anemia, posthemorrhagic, acute 05/04/2022   Hematemesis without nausea 05/04/2022   Colon cancer screening 05/04/2022   Acute gastric ulcer with hemorrhage 05/04/2022   Chronic diastolic CHF (congestive heart failure) (HCC) 04/17/2022   Depression, major, single episode, mild (HCC) 04/17/2022   Controlled type 2 diabetes with neuropathy (HCC) 04/17/2022   Housing instability, currently housed, at risk for homelessness 04/17/2022   Fibromyalgia 08/29/2020   Hyperlipidemia associated with type 2 diabetes  mellitus (HCC) 06/20/2020   Osteoarthritis 06/20/2020   Normocytic normochromic anemia 11/18/2018   Insomnia 04/25/2013   Allergic rhinitis 08/10/2012   Left low back pain 06/26/2012   Numbness and tingling in both hands 06/26/2012   S4 (fourth heart sound) 05/02/2012   History of stroke 11/20/2011   Essential hypertension 11/20/2011   Hyperlipidemia 11/20/2011   Former light cigarette smoker (1-9 per day) 11/20/2011   Home Medication(s) Prior to Admission medications   Medication Sig Start Date End Date Taking? Authorizing Provider  Accu-Chek Softclix Lancets lancets Use as instructed 05/19/22   [name], [name], NP  acetaminophen  (TYLENOL ) 500 MG tablet Take 1,000 mg by mouth every 6 (six) hours as needed for moderate pain or mild pain.    [provider]  Alcohol Swabs (DROPSAFE ALCOHOL PREP) 70 % PADS Apply topically. 05/29/22   [provider]  amLODipine  (NORVASC ) 5 MG tablet Take 5 mg by mouth daily. 11/03/22   [provider]  atorvastatin  (LIPITOR) 40 MG tablet Take 40 mg by mouth daily.    [provider]  Blood Glucose Monitoring Suppl (ACCU-CHEK AVIVA PLUS) w/Device KIT 1 each by Does not apply route daily. 05/09/22   [name], Tinnie LABOR, NP  camphor-menthol (SARNA) lotion Apply 1 Application topically as needed for itching. 04/01/23   [name], Tinnie LABOR, NP  carvedilol  (COREG ) 12.5 MG tablet Take 1 tablet (12.5 mg total) by mouth 2 (two) times daily with a meal. 09/25/22   Jens Durand, MD  cholecalciferol (VITAMIN D3) 25 MCG (1000 UNIT) tablet Take 1 tablet (1,000 Units total) by mouth in the  morning and at bedtime. 09/25/22   Jens Durand, MD  clopidogrel  (PLAVIX ) 75 MG tablet Take 1 tablet (75 mg total) by mouth daily. 05/07/22   Ghimire, Donalda HERO, MD  diclofenac  Sodium (VOLTAREN ) 1 % GEL APPLY 2 G TOPICALLY 4 (FOUR) TIMES DAILY. 12/19/22   [name], Tinnie LABOR, NP  DULoxetine  (CYMBALTA ) 30 MG capsule TAKE 1 CAPSULE BY MOUTH DAILY 09/01/23    [name], [name], NP  ENTRESTO  97-103 MG TAKE 1 TABLET BY MOUTH TWICE  DAILY 08/11/23   [name], [name], NP  glucose blood (ACCU-CHEK AVIVA PLUS) test strip Use as instructed 05/19/22   [name], [name], NP  glucose blood (ONETOUCH ULTRA) test strip Use as instructed 04/17/22   [name], [name], NP  hydrALAZINE  (APRESOLINE ) 25 MG tablet Take 25 mg by mouth 2 (two) times daily. 10/15/22   [provider]  loratadine  (CLARITIN ) 10 MG tablet Take 1 tablet (10 mg total) by mouth daily. 06/22/23   [name], [name], NP  meclizine  (ANTIVERT ) 25 MG tablet Take 25 mg by mouth daily as needed. 10/07/23   [provider]  melatonin 3 MG TABS tablet TAKE 1 TABLET AT BEDTIME 03/17/22   Ganta, Anupa, DO  metFORMIN  (GLUCOPHAGE ) 500 MG tablet Take 500 mg by mouth daily. 12/25/22   [provider]  Multiple Vitamin (MULTIVITAMIN) tablet Take 1 tablet by mouth daily. 06/22/23   [name], Tinnie LABOR, NP  nortriptyline  (PAMELOR ) 10 MG capsule TAKE 1 CAPSULE BY MOUTH AT  BEDTIME 07/21/23   [name], [name], NP  ondansetron  (ZOFRAN ) 4 MG tablet TAKE 1 TABLET EVERY 8 HOURS AS NEEDED FOR NAUSEA OR VOMITING Patient taking differently: Take 4 mg by mouth every 8 (eight) hours as needed for nausea or vomiting. 06/03/22   [name], [name], NP  pantoprazole  (PROTONIX ) 40 MG tablet Take 1 tablet (40 mg total) by mouth 2 (two) times daily. 06/22/23   [name], [name], NP  polyethylene glycol powder (GLYCOLAX /MIRALAX ) 17 GM/SCOOP powder Take 17 g by mouth 2 (two) times daily as needed for mild constipation. 06/22/23   [name], [name], NP  tiZANidine (ZANAFLEX) 2 MG tablet Take 2 mg by mouth at bedtime. 12/17/22   [provider]  triamcinolone cream (KENALOG) 0.1 % Apply 1 Application topically See admin instructions. 2-3 times daily 09/18/23   [provider]  triamterene -hydrochlorothiazide  (MAXZIDE-25) 37.5-25 MG tablet Take 1 tablet by mouth daily. 06/22/23   Nedra Tinnie LABOR, NP                                                                                                                                     Past Surgical History Past Surgical History:  Procedure Laterality Date   BIOPSY  09/29/2022   Procedure: BIOPSY;  Surgeon: Leigh Elspeth SQUIBB, MD;  Location: [facility];  Service: Gastroenterology;;   CESAREAN SECTION     COLONOSCOPY N/A 09/29/2022   Procedure:  COLONOSCOPY;  Surgeon: Leigh Elspeth SQUIBB, MD;  Location: [facility];  Service: Gastroenterology;  Laterality: N/A;   ESOPHAGOGASTRODUODENOSCOPY (EGD) WITH PROPOFOL  N/A 05/04/2022   Procedure: ESOPHAGOGASTRODUODENOSCOPY (EGD) WITH PROPOFOL ;  Surgeon: Leigh Elspeth SQUIBB, MD;  Location: [facility];  Service: Gastroenterology;  Laterality: N/A;   ESOPHAGOGASTRODUODENOSCOPY (EGD) WITH PROPOFOL  N/A 09/29/2022   Procedure: ESOPHAGOGASTRODUODENOSCOPY (EGD) WITH PROPOFOL ;  Surgeon: Leigh Elspeth SQUIBB, MD;  Location: [facility];  Service: Gastroenterology;  Laterality: N/A;   RIGHT/LEFT HEART CATH AND CORONARY ANGIOGRAPHY N/A 11/19/2018   Procedure: RIGHT/LEFT HEART CATH AND CORONARY ANGIOGRAPHY;  Surgeon: Claudene Pacific, MD;  Location: [facility] INVASIVE [facility];  Service: Cardiovascular;  Laterality: N/A;   Family History Family History  Problem Relation Age of Onset   Kidney disease Father    Heart disease Father    Cancer Brother        Throat    Birth defects Son     Social History Social History   Tobacco Use   Smoking status: Former    Current packs/day: 0.00    Average packs/day: 0.3 packs/day for 25.0 years (6.3 ttl pk-yrs)    Types: Cigarettes    Start date: 04/07/1988    Quit date: 04/07/2013    Years since quitting: 10.5   Smokeless tobacco: Former    Quit date: 04/07/2013   Tobacco comments:    2 cigs a day  Vaping Use   Vaping status: Never Used  Substance Use Topics   Alcohol use: Not Currently    Alcohol/week: 2.0 standard drinks of alcohol    Types: 2 Cans of beer per week   Drug use: No    Allergies Ace inhibitors, Aspirin , Codeine, and Hydrocodone  Review of Systems Review of Systems  All other systems reviewed and are negative.   Physical Exam Vital Signs  I have reviewed the triage vital signs BP (!) 151/84   Pulse 72   Temp 98.2 F (36.8 C)   Resp 16   Ht 5' 5 (1.651 m)   Wt 67 kg   SpO2 99%   BMI 24.58 kg/m  Physical Exam Vitals and nursing note reviewed.  Constitutional:      General: She is not in acute distress.    Appearance: She is well-developed.  HENT:     Head: Normocephalic and atraumatic.     Mouth/Throat:     Mouth: Mucous membranes are dry.   Eyes:     Pupils: Pupils are equal, round, and reactive to light.    Cardiovascular:     Rate and Rhythm: Normal rate and regular rhythm.     Heart sounds: No murmur heard. Pulmonary:     Effort: Pulmonary effort is normal. No respiratory distress.     Breath sounds: Normal breath sounds.  Abdominal:     General: Abdomen is flat.     Palpations: Abdomen is soft.     Tenderness: There is no abdominal tenderness.   Musculoskeletal:        General: No tenderness.     Right lower leg: No edema.     Left lower leg: No edema.     Comments: No midline C, T, L-spine tenderness.  No chest wall tenderness or crepitus.  Full painless range of motion at the bilateral upper extremities including the shoulders, elbows, wrists, hand and fingers, and in the bilateral lower extremities including the hips, knees, ankle, toes.  No focal bony tenderness, injury or deformity.   Skin:    General: Skin  is warm and dry.   Neurological:     General: No focal deficit present.     Mental Status: She is alert. Mental status is at baseline.   Psychiatric:        Mood and Affect: Mood normal.        Behavior: Behavior normal.     ED Results and Treatments Labs (all labs ordered are listed, but only abnormal results are displayed) Labs Reviewed  CBC WITH DIFFERENTIAL/PLATELET - Abnormal; Notable for the  following components:      Result Value   RBC 3.57 (*)    Hemoglobin 11.2 (*)    HCT 33.1 (*)    All other components within normal limits  COMPREHENSIVE METABOLIC PANEL WITH GFR - Abnormal; Notable for the following components:   Sodium 130 (*)    CO2 20 (*)    Glucose, Bld 140 (*)    BUN 29 (*)    Creatinine, Ser 2.06 (*)    Calcium  8.0 (*)    GFR, Estimated 25 (*)    Anion gap 3 (*)    All other components within normal limits  MAGNESIUM  CK  HEMOGLOBIN A1C  BASIC METABOLIC PANEL WITH GFR  CBC                                                                                                                          Radiology CT Head Wo Contrast Result Date: 10/08/2023 CLINICAL DATA:  Head trauma EXAM: CT HEAD WITHOUT CONTRAST CT CERVICAL SPINE WITHOUT CONTRAST TECHNIQUE: Multidetector CT imaging of the head and cervical spine was performed following the standard protocol without intravenous contrast. Multiplanar CT image reconstructions of the cervical spine were also generated. RADIATION DOSE REDUCTION: This exam was performed according to the departmental dose-optimization program which includes automated exposure control, adjustment of the mA and/or kV according to patient size and/or use of iterative reconstruction technique. COMPARISON:  09/23/2022 FINDINGS: CT HEAD FINDINGS Brain: No evidence of acute infarction, hemorrhage, hydrocephalus, extra-axial collection or mass lesion/mass effect. Vascular: No hyperdense vessel or unexpected calcification. Skull: Normal. Negative for fracture or focal lesion. Sinuses/Orbits: No acute finding. Other: None. CT CERVICAL SPINE FINDINGS Alignment: Normal. Skull base and vertebrae: No acute fracture. No primary bone lesion or focal pathologic process. Soft tissues and spinal canal: No prevertebral fluid or swelling. No visible canal hematoma. Disc levels: Moderate multilevel cervical disc degenerative disease with large anterior bridging  osteophytes. Upper chest: Negative. Other: None. IMPRESSION: 1. No acute intracranial pathology. 2. No fracture or static subluxation of the cervical spine. 3. Moderate multilevel cervical disc degenerative disease with large anterior bridging osteophytes. Electronically Signed   By: Marolyn JONETTA Jaksch M.D.   On: 10/08/2023 19:31   CT Cervical Spine Wo Contrast Result Date: 10/08/2023 CLINICAL DATA:  Head trauma EXAM: CT HEAD WITHOUT CONTRAST CT CERVICAL SPINE WITHOUT CONTRAST TECHNIQUE: Multidetector CT imaging of the head and cervical spine was performed following the standard protocol without intravenous contrast.  Multiplanar CT image reconstructions of the cervical spine were also generated. RADIATION DOSE REDUCTION: This exam was performed according to the departmental dose-optimization program which includes automated exposure control, adjustment of the mA and/or kV according to patient size and/or use of iterative reconstruction technique. COMPARISON:  09/23/2022 FINDINGS: CT HEAD FINDINGS Brain: No evidence of acute infarction, hemorrhage, hydrocephalus, extra-axial collection or mass lesion/mass effect. Vascular: No hyperdense vessel or unexpected calcification. Skull: Normal. Negative for fracture or focal lesion. Sinuses/Orbits: No acute finding. Other: None. CT CERVICAL SPINE FINDINGS Alignment: Normal. Skull base and vertebrae: No acute fracture. No primary bone lesion or focal pathologic process. Soft tissues and spinal canal: No prevertebral fluid or swelling. No visible canal hematoma. Disc levels: Moderate multilevel cervical disc degenerative disease with large anterior bridging osteophytes. Upper chest: Negative. Other: None. IMPRESSION: 1. No acute intracranial pathology. 2. No fracture or static subluxation of the cervical spine. 3. Moderate multilevel cervical disc degenerative disease with large anterior bridging osteophytes. Electronically Signed   By: Marolyn JONETTA Jaksch M.D.   On: 10/08/2023 19:31    DG Knee Complete 4 Views Right Result Date: 10/08/2023 CLINICAL DATA:  Bilateral knee pain after fall. EXAM: RIGHT KNEE - COMPLETE 4+ VIEW COMPARISON:  None Available. FINDINGS: No evidence of fracture, dislocation, or joint effusion. No evidence of arthropathy or other focal bone abnormality. Soft tissues are unremarkable. IMPRESSION: Negative. Electronically Signed   By: Lynwood Landy Raddle M.D.   On: 10/08/2023 16:30   DG Knee Complete 4 Views Left Result Date: 10/08/2023 CLINICAL DATA:  Bilateral knee pain after fall. EXAM: LEFT KNEE - COMPLETE 4+ VIEW COMPARISON:  None Available. FINDINGS: No evidence of fracture, dislocation, or joint effusion. No evidence of arthropathy or other focal bone abnormality. Soft tissues are unremarkable. IMPRESSION: Negative. Electronically Signed   By: Lynwood Landy Raddle M.D.   On: 10/08/2023 16:29    Pertinent labs & imaging results that were available during my care of the patient were reviewed by me and considered in my medical decision making (see MDM for details).  Medications Ordered in ED Medications  amLODipine  (NORVASC ) tablet 5 mg (has no administration in time range)  atorvastatin  (LIPITOR) tablet 40 mg (has no administration in time range)  carvedilol  (COREG ) tablet 12.5 mg (has no administration in time range)  DULoxetine  (CYMBALTA ) DR capsule 30 mg (has no administration in time range)  clopidogrel  (PLAVIX ) tablet 75 mg (has no administration in time range)  pantoprazole  (PROTONIX ) EC tablet 40 mg (has no administration in time range)  insulin  aspart (novoLOG ) injection 0-6 Units (has no administration in time range)  insulin  aspart (novoLOG ) injection 0-5 Units (has no administration in time range)  heparin  injection 5,000 Units (has no administration in time range)  acetaminophen  (TYLENOL ) tablet 650 mg (has no administration in time range)    Or  acetaminophen  (TYLENOL ) suppository 650 mg (has no administration in time range)  lactated ringers   infusion (has no administration in time range)  ondansetron  (ZOFRAN ) tablet 4 mg (has no administration in time range)    Or  ondansetron  (ZOFRAN ) injection 4 mg (has no administration in time range)  sodium chloride  0.9 % bolus 1,000 mL (1,000 mLs Intravenous New Bag/Given 10/08/23 1843)  Procedures Procedures  (including critical care time)  Medical Decision Making / ED Course   MDM:  73 year old presenting to the emergency department with lightheadedness with standing.  Patient overall well-appearing, physical examination with no obvious process, no signs of acute traumatic injury.  I did not appreciate  any specific knee deformity or significant tenderness.  Given head injury in elderly patient also obtain CT head, cervical spine.  X-rays of the knees negative.  Will obtain labs to evaluate for dehydration.  Will give IV fluids.  Will reassess.  Suspect may be component of dehydration given extreme environmental heat recently and lack of air conditioning.  No chest pain, palpitations to suggest cardiac cause send no loss of consciousness.  Will check EKG  Clinical Course as of 10/08/23 2020  Thu Oct 08, 2023  2019 Signed out to Dr. Charlton for admission.  [WS]    Clinical Course User Index [WS] Francesca Elsie CROME, MD     Additional history obtained:  -External records from outside source obtained and reviewed including: Chart review including previous notes, labs, imaging, consultation notes including prior labs    Lab Tests: -I ordered, reviewed, and interpreted labs.   The pertinent results include:   Labs Reviewed  CBC WITH DIFFERENTIAL/PLATELET - Abnormal; Notable for the following components:      Result Value   RBC 3.57 (*)    Hemoglobin 11.2 (*)    HCT 33.1 (*)    All other components within normal limits  COMPREHENSIVE METABOLIC  PANEL WITH GFR - Abnormal; Notable for the following components:   Sodium 130 (*)    CO2 20 (*)    Glucose, Bld 140 (*)    BUN 29 (*)    Creatinine, Ser 2.06 (*)    Calcium  8.0 (*)    GFR, Estimated 25 (*)    Anion gap 3 (*)    All other components within normal limits  MAGNESIUM  CK  HEMOGLOBIN A1C  BASIC METABOLIC PANEL WITH GFR  CBC    Notable for AKI  EKG   EKG Interpretation Date/Time:  Thursday October 08 2023 17:40:38 EDT Ventricular Rate:  71 PR Interval:  198 QRS Duration:  82 QT Interval:  410 QTC Calculation: 445 R Axis:   16  Text Interpretation: Sinus rhythm with frequent Premature ventricular complexes Otherwise normal ECG Confirmed by Francesca Elsie ([id number]) on 10/08/2023 6:40:48 PM         Imaging Studies ordered: I ordered imaging studies including CT head, XR knee bilat On my interpretation imaging demonstrates no acute injury I independently visualized and interpreted imaging. I agree with the radiologist interpretation   Medicines ordered and prescription drug management: Meds ordered this encounter  Medications   sodium chloride  0.9 % bolus 1,000 mL   amLODipine  (NORVASC ) tablet 5 mg   atorvastatin  (LIPITOR) tablet 40 mg   carvedilol  (COREG ) tablet 12.5 mg   DULoxetine  (CYMBALTA ) DR capsule 30 mg   clopidogrel  (PLAVIX ) tablet 75 mg   pantoprazole  (PROTONIX ) EC tablet 40 mg   insulin  aspart (novoLOG ) injection 0-6 Units    Correction coverage::   Very Sensitive (ESRD/Dialysis)    CBG < 70::   Implement Hypoglycemia Standing Orders and refer to Hypoglycemia Standing Orders sidebar report    CBG 70 - 120::   0 units    CBG 121 - 150::   0 units    CBG 151 - 200::   1 unit    CBG 201-250::   2 units  CBG 251-300::   3 units    CBG 301-350::   4 units    CBG 351-400::   5 units    CBG > 400:   Give 6 units and call MD   insulin  aspart (novoLOG ) injection 0-5 Units    Correction coverage::   HS scale    CBG < 70::   Implement  Hypoglycemia Standing Orders and refer to Hypoglycemia Standing Orders sidebar report    CBG 70 - 120::   0 units    CBG 121 - 150::   0 units    CBG 151 - 200::   0 units    CBG 201 - 250::   2 units    CBG 251 - 300::   3 units    CBG 301 - 350::   4 units    CBG 351 - 400::   5 units    CBG > 400:   call MD and obtain STAT lab verification   heparin  injection 5,000 Units   OR Linked Order Group    acetaminophen  (TYLENOL ) tablet 650 mg    acetaminophen  (TYLENOL ) suppository 650 mg   lactated ringers  infusion   OR Linked Order Group    ondansetron  (ZOFRAN ) tablet 4 mg    ondansetron  (ZOFRAN ) injection 4 mg    -I have reviewed the patients home medicines and have made adjustments as needed   Consultations Obtained: I requested consultation with the hospitalist,  and discussed lab and imaging findings as well as pertinent plan - they recommend: admission   Cardiac Monitoring: The patient was maintained on a cardiac monitor.  I personally viewed and interpreted the cardiac monitored which showed an underlying rhythm of: NSR  Social Determinants of Health:  Diagnosis or treatment significantly limited by social determinants of health: lives alone   Reevaluation: After the interventions noted above, I reevaluated the patient and found that their symptoms have improved  Co morbidities that complicate the patient evaluation  Past Medical History:  Diagnosis Date   ARF (acute renal failure) (HCC) 11/18/2018   Coronary artery disease    Diabetes mellitus (HCC) 2007   High cholesterol 2007   Hypertension    Peptic ulcer 1965   Stroke [facility])       Dispostion: Disposition decision including need for hospitalization was considered, and patient admitted to the hospital.    Final Clinical Impression(s) / ED Diagnoses Final diagnoses:  Dehydration  AKI (acute kidney injury) (HCC)     This chart was dictated using voice recognition software.  Despite best efforts to  proofread,  errors can occur which can change the documentation meaning.    Francesca Elsie CROME, MD 10/08/23 2020

## 2023-10-08 NOTE — ED Provider Triage Note (Signed)
 Emergency Medicine Provider Triage Evaluation Note  Lisa [name] [place] , a 73 y.o. female  was evaluated in triage.  Pt complains of bilateral knee pain, dizziness and dehydration.  Patient states for the past couple days she has been having dizziness, specifically with standing or changing positions.  This caused her to fall this morning while she was trying to pull her water, now resulting in bilateral knee pain.  No syncope or loss of consciousness.  No new medications or recent illness.  Review of Systems  Positive: Dizziness, knee pain Negative: Fever recent infection  Physical Exam  BP (!) 150/70 (BP Location: Left Arm)   Pulse 74   Temp 98.1 F (36.7 C) (Oral)   Resp 16   Ht 5' 5 (1.651 m)   Wt 67 kg   SpO2 98%   BMI 24.58 kg/m  Gen:   Awake, no distress   Resp:  Normal effort  MSK:   Moves extremities without difficulty, tenderness to palpation of the bilateral patellas without noted deformity/swelling Other:  Conversational and pleasant  Medical Decision Making  Medically screening exam initiated at 3:51 PM.  Appropriate orders placed.  Lisa [name] was informed that the remainder of the evaluation will be completed by another provider, this initial triage assessment does not replace that evaluation, and the importance of remaining in the ED until their evaluation is complete.  73 year old female presents emergency department with dizziness with standing, resulting in a mechanical fall down onto her bilateral knees now with knee pain.  Currently feels fatigued and dehydrated.  No other acute complaints.  Patient evaluated sitting up in chair, fully clothed, limited PE. Orders placed. Patient was counseled that they need to remain in the ED until the completion of their work-up including a full H&P, additional testing and results of any tests.  The patient appears stable and the remainder of the encounter may be completed by another provider.   [name],  DO 10/08/23 1552

## 2023-10-08 NOTE — ED Notes (Signed)
 Report given patient transferred to room 46

## 2023-10-09 DIAGNOSIS — E1140 Type 2 diabetes mellitus with diabetic neuropathy, unspecified: Secondary | ICD-10-CM | POA: Diagnosis not present

## 2023-10-09 DIAGNOSIS — I5032 Chronic diastolic (congestive) heart failure: Secondary | ICD-10-CM | POA: Diagnosis not present

## 2023-10-09 DIAGNOSIS — I10 Essential (primary) hypertension: Secondary | ICD-10-CM | POA: Diagnosis not present

## 2023-10-09 DIAGNOSIS — N179 Acute kidney failure, unspecified: Secondary | ICD-10-CM | POA: Diagnosis not present

## 2023-10-09 LAB — BASIC METABOLIC PANEL WITH GFR
Anion gap: 7 (ref 5–15)
BUN: 27 mg/dL — ABNORMAL HIGH (ref 8–23)
CO2: 23 mmol/L (ref 22–32)
Calcium: 8.7 mg/dL — ABNORMAL LOW (ref 8.9–10.3)
Chloride: 104 mmol/L (ref 98–111)
Creatinine, Ser: 1.73 mg/dL — ABNORMAL HIGH (ref 0.44–1.00)
GFR, Estimated: 31 mL/min — ABNORMAL LOW (ref >60)
Glucose, Bld: 104 mg/dL — ABNORMAL HIGH (ref 70–99)
Potassium: 4.2 mmol/L (ref 3.5–5.1)
Sodium: 134 mmol/L — ABNORMAL LOW (ref 135–145)

## 2023-10-09 LAB — GLUCOSE, CAPILLARY
Glucose-Capillary: 245 mg/dL — ABNORMAL HIGH (ref 70–99)
Glucose-Capillary: 163 mg/dL — ABNORMAL HIGH (ref 70–99)
Glucose-Capillary: 133 mg/dL — ABNORMAL HIGH (ref 70–99)
Glucose-Capillary: 201 mg/dL — ABNORMAL HIGH (ref 70–99)

## 2023-10-09 LAB — HEMOGLOBIN A1C
Hgb A1c MFr Bld: 6.8 % — ABNORMAL HIGH (ref 4.8–5.6)
Mean Plasma Glucose: 148.46 mg/dL

## 2023-10-09 LAB — CBC
HCT: 31.2 % — ABNORMAL LOW (ref 36.0–46.0)
Hemoglobin: 10.3 g/dL — ABNORMAL LOW (ref 12.0–15.0)
MCH: 31 pg (ref 26.0–34.0)
MCHC: 33 g/dL (ref 30.0–36.0)
MCV: 94 fL (ref 80.0–100.0)
Platelets: 192 10*3/uL (ref 150–400)
RBC: 3.32 MIL/uL — ABNORMAL LOW (ref 3.87–5.11)
RDW: 12.5 % (ref 11.5–15.5)
WBC: 5.3 10*3/uL (ref 4.0–10.5)
nRBC: 0 % (ref 0.0–0.2)

## 2023-10-09 MED ORDER — METOCLOPRAMIDE HCL 5 MG/ML IJ SOLN: 5.0000 mg | Freq: Three times a day (TID) | INTRAMUSCULAR | Status: DC | PRN

## 2023-10-09 MED ORDER — SUCRALFATE 1 GM/10ML PO SUSP
1.0000 g | Freq: Three times a day (TID) | ORAL | Status: DC
  Administered 2023-10-09 – 2023-10-10 (×3): 1 g via ORAL
  Filled 2023-10-09 (×3): qty 10

## 2023-10-09 NOTE — Plan of Care (Signed)

## 2023-10-09 NOTE — Progress Notes (Signed)
 Progress Note   Patient: Lisa [name] FMW:[id number] DOB: 1950/11/30 DOA: 10/08/2023  DOS: the patient was seen and examined on 10/09/2023   Brief hospital course:   73 y.o. female with medical history significant for hypertension, hyperlipidemia, diabetes mellitus, CAD, history of CVA, and cardiomyopathy with recovered EF who presents with lightheadedness on standing and bilateral knee pain from a fall.   Assessment and Plan:  Acute kidney injury - Likely prerenal secondary to decreased p.o. intake.  Exacerbated by Entresto /diuretics.  Holding nephrotoxic agents.  IV fluid hydration on board.  Creatinine showing improvement.  Monitor urine output recheck BMP and magnesium in AM.  Intractable nausea - Possible gastritis or gastroparesis.  Patient states she has worsening nausea when eating which is led to her not wanting to eat.  Will initiate on PPI, sucralfate.  Will also give Zofran , attempt Reglan  as needed.  Chronic HFpEF - Does not appear to be in exacerbation.  Continue Coreg .  Diabetes mellitus - Insulin  sliding scale on board.  Physical debilitation muscle weakness - Multiple falls at home, likely exacerbated by orthostasis/dizziness.  PT/OT ordered.  Subjective: Patient resting in bed comfortably this morning.  States she feels improved from presentation.  Was having multiple falls at home.   admits to increased nausea with the eating and low p.o. intake over the last few days.  Denies any fevers, chest pain, shortness of breath, but does admit to mild abdominal pain and nausea.  No diarrhea.  No vomiting.  Physical Exam:  Vitals:   10/09/23 0115 10/09/23 0149 10/09/23 0432 10/09/23 0935  BP:  (!) 147/72 (!) 112/52 (!) 120/58  Pulse: 73 70 69 74  Resp: 17 18 18 18   Temp:  98 F (36.7 C) 98.2 F (36.8 C) 97.8 F (36.6 C)  TempSrc:      SpO2: 100% 100% 96% 98%  Weight:      Height:        GENERAL:  Alert, pleasant, no acute distress  HEENT:   EOMI CARDIOVASCULAR:  RRR, no murmurs appreciated RESPIRATORY:  Clear to auscultation, no wheezing, rales, or rhonchi GASTROINTESTINAL:  Soft, nontender, nondistended EXTREMITIES:  No LE edema bilaterally NEURO:  No new focal deficits appreciated SKIN:  No rashes noted PSYCH:  Appropriate mood and affect    Data Reviewed:  No new imaging to review  Previous records (including but not limited to H&P, progress notes, nursing notes, TOC management) were reviewed in assessment of this patient.  Labs: CBC: Recent Labs  Lab 10/08/23 1806 10/09/23 0721  WBC 4.7 5.3  NEUTROABS 2.9  --   HGB 11.2* 10.3*  HCT 33.1* 31.2*  MCV 92.7 94.0  PLT 206 192   Basic Metabolic Panel: Recent Labs  Lab 10/08/23 1806 10/09/23 0721  NA 130* 134*  K 4.3 4.2  CL 107 104  CO2 20* 23  GLUCOSE 140* 104*  BUN 29* 27*  CREATININE 2.06* 1.73*  CALCIUM  8.0* 8.7*  MG 2.2  --    Liver Function Tests: Recent Labs  Lab 10/08/23 1806  AST 21  ALT 13  ALKPHOS 87  BILITOT 1.0  PROT 7.7  ALBUMIN 3.9   CBG: Recent Labs  Lab 10/08/23 2140 10/09/23 0937 10/09/23 1120  GLUCAP 147* 245* 163*    Scheduled Meds:  amLODipine   5 mg Oral Daily   atorvastatin   40 mg Oral Daily   carvedilol   12.5 mg Oral BID WC   clopidogrel   75 mg Oral Daily   DULoxetine   30  mg Oral Daily   heparin   5,000 Units Subcutaneous Q8H   insulin  aspart  0-5 Units Subcutaneous QHS   insulin  aspart  0-6 Units Subcutaneous TID WC   pantoprazole   40 mg Oral BID   Continuous Infusions: PRN Meds:.acetaminophen  **OR** acetaminophen , ondansetron  **OR** ondansetron  (ZOFRAN ) IV  Family Communication: None at bedside  Disposition: Status is: Inpatient Remains inpatient appropriate because: Acute kidney injury     Time spent: 37 minutes  Length of inpatient stay: 1 days  Author: Carliss LELON Canales, DO 10/09/2023 12:56 PM  For on call review [email or web address].

## 2023-10-09 NOTE — Hospital Course (Signed)
 73 y.o. female with medical history significant for hypertension, hyperlipidemia, diabetes mellitus, CAD, history of CVA, and cardiomyopathy with recovered EF who presents with lightheadedness on standing and bilateral knee pain from a fall.    Assessment and Plan:   Acute kidney injury - Likely prerenal secondary to decreased p.o. intake.  Exacerbated by Entresto /diuretics.  Holding nephrotoxic agents.  IV fluid hydration on board.  Creatinine showing improvement.  Monitor urine output recheck BMP and magnesium in AM.   Intractable nausea - Possible gastritis or gastroparesis.  Patient states she has worsening nausea when eating which is led to her not wanting to eat.  Will initiate on PPI, sucralfate.  Will also give Zofran , attempt Reglan  as needed.   Chronic HFpEF - Does not appear to be in exacerbation.  Continue Coreg .   Diabetes mellitus - Insulin  sliding scale on board.   Physical debilitation muscle weakness - Multiple falls at home, likely exacerbated by orthostasis/dizziness.  PT/OT ordered.

## 2023-10-09 NOTE — Evaluation (Signed)
 Physical Therapy Evaluation Patient Details Name: Lisa [name] MRN: [id number] DOB: [birth year] Today's Date: 10/09/2023  History of Present Illness  Pt is a 73 y/o F admitted on 10/08/23 after presenting with c/o lightheadedness resulting in multiple falls. Pt is being treated for AKI. PMH: DM, CAD, HTN, HLD, stroke  Clinical Impression  Pt seen for PT evaluation with pt agreeable, very pleasant & appreciative of PT's efforts. Pt reports prior to admission she was living alone, ambulatory without AD, notes 2 falls recently, reports dizziness happens when she drinks cold water. On this date, pt is able to complete bed mobility without assistance, ambulate without AD with mod I (pt reports current gait pattern is baseline). Pt without c/o dizziness throughout session. At this time, pt appears to be at baseline in regards to functional mobility, no acute PT needs at this time.  BP checked in LUE: Supine: 149/77 (94) Sitting: 120/69 (85) Standing at 0: 120/64 (77) Standing at 3: 132/64 (85)      If plan is discharge home, recommend the following: Assist for transportation   Can travel by private vehicle        Equipment Recommendations None recommended by PT  Recommendations for Other Services       Functional Status Assessment Patient has had a recent decline in their functional status and demonstrates the ability to make significant improvements in function in a reasonable and predictable amount of time.     Precautions / Restrictions Precautions Precautions: Fall Restrictions Weight Bearing Restrictions Per Provider Order: No      Mobility  Bed Mobility Overal bed mobility: Modified Independent                  Transfers Overall transfer level: Modified independent Equipment used: None               General transfer comment: sit>stand from EOB    Ambulation/Gait Ambulation/Gait assistance: Modified independent (Device/Increase time) Gait Distance  (Feet): 200 Feet Assistive device: None Gait Pattern/deviations: Decreased step length - right, Decreased step length - left, Decreased stride length, Decreased dorsiflexion - right Gait velocity: decreased     General Gait Details: decreased reciprocal arm swing RUE; pt reports current gait pattern is baseline  Careers information officer     Tilt Bed    Modified Rankin (Stroke Patients Only)       Balance Overall balance assessment: Needs assistance, History of Falls Sitting-balance support: Feet supported Sitting balance-Leahy Scale: Good     Standing balance support: During functional activity, No upper extremity supported Standing balance-Leahy Scale: Good                               Pertinent Vitals/Pain Pain Assessment Pain Assessment: Faces Faces Pain Scale: Hurts a little bit Pain Location: discomfort posterior R knee Pain Descriptors / Indicators: Discomfort Pain Intervention(s): Monitored during session    Home Living Family/patient expects to be discharged to:: Private residence Living Arrangements: Alone Available Help at Discharge: Friend(s);Available PRN/intermittently Type of Home: Apartment Home Access: Level entry       Home Layout: One level Home Equipment: Cane - single point      Prior Function               Mobility Comments: Does not drive, transportation services to appointments, ambulatory without AD ADLs Comments: cooks & cleans without assistance,  bathes & dresses independently     Extremity/Trunk Assessment                Communication   Communication Communication: No apparent difficulties    Cognition Arousal: Alert Behavior During Therapy: WFL for tasks assessed/performed   PT - Cognitive impairments: No apparent impairments                         Following commands: Intact       Cueing Cueing Techniques: Verbal cues     General Comments      Exercises      Assessment/Plan    PT Assessment Patient does not need any further PT services  PT Problem List         PT Treatment Interventions      PT Goals (Current goals can be found in the Care Plan section)  Acute Rehab PT Goals Patient Stated Goal: figure out why she's falling so much PT Goal Formulation: All assessment and education complete, DC therapy Time For Goal Achievement: 10/23/23 Potential to Achieve Goals: Good    Frequency       Co-evaluation               AM-PAC PT 6 Clicks Mobility  Outcome Measure Help needed turning from your back to your side while in a flat bed without using bedrails?: None Help needed moving from lying on your back to sitting on the side of a flat bed without using bedrails?: None Help needed moving to and from a bed to a chair (including a wheelchair)?: None Help needed standing up from a chair using your arms (e.g., wheelchair or bedside chair)?: None Help needed to walk in hospital room?: None Help needed climbing 3-5 steps with a railing? : None 6 Click Score: 24    End of Session   Activity Tolerance: Patient tolerated treatment well Patient left: in chair;with call bell/phone within reach Nurse Communication: Mobility status      Time: 8676-8657 PT Time Calculation (min) (ACUTE ONLY): 19 min   Charges:   PT Evaluation $PT Eval Low Complexity: 1 Low   PT General Charges $$ ACUTE PT VISIT: 1 Visit         [name], PT, DPT 10/09/23, 1:52 PM   [name] 10/09/2023, 1:49 PM

## 2023-10-10 ENCOUNTER — Other Ambulatory Visit (HOSPITAL_COMMUNITY): Payer: Self-pay

## 2023-10-10 DIAGNOSIS — N179 Acute kidney failure, unspecified: Secondary | ICD-10-CM | POA: Diagnosis not present

## 2023-10-10 DIAGNOSIS — I10 Essential (primary) hypertension: Secondary | ICD-10-CM | POA: Diagnosis not present

## 2023-10-10 DIAGNOSIS — E1140 Type 2 diabetes mellitus with diabetic neuropathy, unspecified: Secondary | ICD-10-CM | POA: Diagnosis not present

## 2023-10-10 DIAGNOSIS — I5032 Chronic diastolic (congestive) heart failure: Secondary | ICD-10-CM | POA: Diagnosis not present

## 2023-10-10 DIAGNOSIS — K2970 Gastritis, unspecified, without bleeding: Secondary | ICD-10-CM

## 2023-10-10 LAB — MAGNESIUM: Magnesium: 2 mg/dL (ref 1.7–2.4)

## 2023-10-10 LAB — BASIC METABOLIC PANEL WITH GFR
Anion gap: 8 (ref 5–15)
BUN: 22 mg/dL (ref 8–23)
CO2: 24 mmol/L (ref 22–32)
Calcium: 8.9 mg/dL (ref 8.9–10.3)
Chloride: 106 mmol/L (ref 98–111)
Creatinine, Ser: 1.62 mg/dL — ABNORMAL HIGH (ref 0.44–1.00)
GFR, Estimated: 34 mL/min — ABNORMAL LOW (ref >60)
Glucose, Bld: 134 mg/dL — ABNORMAL HIGH (ref 70–99)
Potassium: 4.1 mmol/L (ref 3.5–5.1)
Sodium: 138 mmol/L (ref 135–145)

## 2023-10-10 LAB — CBC
HCT: 32.3 % — ABNORMAL LOW (ref 36.0–46.0)
Hemoglobin: 10.8 g/dL — ABNORMAL LOW (ref 12.0–15.0)
MCH: 31.6 pg (ref 26.0–34.0)
MCHC: 33.4 g/dL (ref 30.0–36.0)
MCV: 94.4 fL (ref 80.0–100.0)
Platelets: 196 10*3/uL (ref 150–400)
RBC: 3.42 MIL/uL — ABNORMAL LOW (ref 3.87–5.11)
RDW: 12.6 % (ref 11.5–15.5)
WBC: 4.5 10*3/uL (ref 4.0–10.5)
nRBC: 0 % (ref 0.0–0.2)

## 2023-10-10 LAB — GLUCOSE, CAPILLARY
Glucose-Capillary: 128 mg/dL — ABNORMAL HIGH (ref 70–99)
Glucose-Capillary: 149 mg/dL — ABNORMAL HIGH (ref 70–99)

## 2023-10-10 MED ORDER — SUCRALFATE 1 GM/10ML PO SUSP
1.0000 g | Freq: Three times a day (TID) | ORAL | 0 refills | Status: AC
  Filled 2023-10-10: qty 473, 12d supply, fill #0

## 2023-10-10 MED ORDER — ONDANSETRON HCL 4 MG PO TABS
4.0000 mg | ORAL_TABLET | Freq: Four times a day (QID) | ORAL | 0 refills | Status: AC | PRN
  Filled 2023-10-10: qty 20, 5d supply, fill #0

## 2023-10-10 MED ORDER — PANTOPRAZOLE SODIUM 40 MG PO TBEC
40.0000 mg | DELAYED_RELEASE_TABLET | Freq: Two times a day (BID) | ORAL | 3 refills | Status: AC
  Filled 2023-10-10: qty 180, 90d supply, fill #0

## 2023-10-10 NOTE — Discharge Instructions (Addendum)
 [facility] Program for Eligible Seniors  [employer] is distributing fans to eligible seniors, while supplies list. Pick up hours at [address] ([place]), [place], [place] [address] Monday - Thursday 9:00 am - 4:00 pm, Friday :00 am  - 2:30 pm.  Eligibility (must meet both criteria 1 and 2):  1. You must be age 72 or older OR be an adult with a disability (ID required for everyone. SSI/SSDI letter of disability also required if under age 6.)  2. You have NO air conditioning in home OR you have non-functioning air conditioning and cannot afford to operate/repair it.  3. We are prioritizing seniors who did not receive a fan last year.  COOLING STATIONS Rankin:   [facility] has two  locations offering relief from the heat.  The first is at [address], and the second at [address]. Both are open Monday through Friday from 8 am to 5 pm. [facility], [address], is open Tuesday through Thursday from 12 to 5 pm.

## 2023-10-10 NOTE — Discharge Summary (Signed)
 Physician Discharge Summary   Patient: Lisa [name] MRN: [id number] DOB: [birth year]  Admit date:     10/08/2023  Discharge date: 10/10/23  Discharge Physician: Carliss LELON Canales   PCP: [facility], [facility]   Recommendations at discharge:    Pt to be discharged home.   If you experience worsening fever, chills, chest pain, shortness of breath, or other concerning symptoms, please call your PCP or go to the emergency department immediately.  Discharge Diagnoses: Principal Problem:   AKI (acute kidney injury) (HCC) Active Problems:   Essential hypertension   Chronic diastolic CHF (congestive heart failure) (HCC)   History of stroke   Controlled type 2 diabetes with neuropathy (HCC)   Hyponatremia  Resolved Problems:   * No resolved hospital problems. *   Hospital Course:  73 y.o. female with medical history significant for hypertension, hyperlipidemia, diabetes mellitus, CAD, history of CVA, and cardiomyopathy with recovered EF who presents with lightheadedness on standing and bilateral knee pain from a fall.    Assessment and Plan:   Acute kidney injury - Likely prerenal secondary to decreased p.o. intake.  Exacerbated by Entresto /diuretics.  Holding nephrotoxic agents.  IV fluid hydration on board.  Creatinine showing throughout the hospital stay.  Recommend continued oral hydration.  Can resume home medication regimen including Entresto , triamterene /HCTZ beginning tomorrow 6/29.   Intractable nausea - Possible gastritis, possibly chronic.  Patient with history of PUD in the past.  Patient  Patient states she has worsening nausea when eating which is led to her not wanting to eat.  Initiated on PPI, sucralfate.  Given prescriptions for PPI to take twice daily (increased from daily) as well as sucralfate to take as directed.  Recommend patient follow-up with GI in the outpatient setting should her symptoms continue to be unresolved.  Referral provided.   Chronic HFpEF - Does not  appear to be in exacerbation.  Continue home medication regimen.   Diabetes mellitus -Resume home medication regimen   Physical debilitation muscle weakness - Multiple falls at home, likely exacerbated by orthostasis/dizziness.  PT/OT ordered.  No recommendations for STIR/home health/outpatient PT.   Consultants: None Procedures performed: None Disposition: Home Diet recommendation:  Discharge Diet Orders (From admission, onward)     Start     Ordered   10/10/23 0000  Diet - low sodium heart healthy        10/10/23 1127           Cardiac and Carb modified diet  DISCHARGE MEDICATION: Allergies as of 10/10/2023       Reactions   Ace Inhibitors Other (See Comments)   Elevated Cr for 0.8 to 1.4 (30 percent or greater rise in serum creatinine)    Aspirin  Other (See Comments)   Irritates ulcers   Codeine Other (See Comments)   upsets my ulcers   Hydrocodone Other (See Comments)   upsets my ulcers        Medication List     STOP taking these medications    amLODipine  5 MG tablet Commonly known as: NORVASC    meclizine  25 MG tablet Commonly known as: ANTIVERT    melatonin 3 MG Tabs tablet   metFORMIN  500 MG tablet Commonly known as: GLUCOPHAGE        TAKE these medications    Accu-Chek Softclix Lancets lancets Use as instructed   acetaminophen  500 MG tablet Commonly known as: TYLENOL  Take 1,000 mg by mouth every 6 (six) hours as needed for moderate pain or mild pain.   atorvastatin   40 MG tablet Commonly known as: LIPITOR Take 40 mg by mouth daily.   carvedilol  12.5 MG tablet Commonly known as: COREG  Take 1 tablet (12.5 mg total) by mouth 2 (two) times daily with a meal.   cholecalciferol 25 MCG (1000 UNIT) tablet Commonly known as: VITAMIN D3 Take 1 tablet (1,000 Units total) by mouth in the morning and at bedtime. What changed: when to take this   clopidogrel  75 MG tablet Commonly known as: PLAVIX  Take 1 tablet (75 mg total) by mouth  daily.   diclofenac  Sodium 1 % Gel Commonly known as: VOLTAREN  APPLY 2 G TOPICALLY 4 (FOUR) TIMES DAILY. What changed:  how much to take when to take this reasons to take this   DULoxetine  30 MG capsule Commonly known as: CYMBALTA  TAKE 1 CAPSULE BY MOUTH DAILY   Entresto  97-103 MG Generic drug: sacubitril -valsartan  TAKE 1 TABLET BY MOUTH TWICE  DAILY   hydrALAZINE  25 MG tablet Commonly known as: APRESOLINE  Take 25 mg by mouth 2 (two) times daily.   loratadine  10 MG tablet Commonly known as: CLARITIN  Take 1 tablet (10 mg total) by mouth daily.   multivitamin tablet Take 1 tablet by mouth daily.   nortriptyline  10 MG capsule Commonly known as: PAMELOR  TAKE 1 CAPSULE BY MOUTH AT  BEDTIME   ondansetron  4 MG tablet Commonly known as: ZOFRAN  Take 1 tablet (4 mg total) by mouth every 6 (six) hours as needed for nausea. What changed: See the new instructions.   OneTouch Ultra test strip Generic drug: glucose blood Use as instructed   Accu-Chek Aviva Plus test strip Generic drug: glucose blood Use as instructed   pantoprazole  40 MG tablet Commonly known as: PROTONIX  Take 1 tablet (40 mg total) by mouth 2 (two) times daily. What changed: when to take this   polyethylene glycol powder 17 GM/SCOOP powder Commonly known as: GLYCOLAX /MIRALAX  Take 17 g by mouth 2 (two) times daily as needed for mild constipation.   Sarna lotion Generic drug: camphor-menthol Apply 1 Application topically as needed for itching.   sucralfate 1 GM/10ML suspension Commonly known as: CARAFATE Take 10 mLs (1 g total) by mouth 4 (four) times daily -  with meals and at bedtime.   triamcinolone cream 0.1 % Commonly known as: KENALOG Apply 1 Application topically 2 (two) times daily.   triamterene -hydrochlorothiazide  37.5-25 MG tablet Commonly known as: MAXZIDE-25 Take 1 tablet by mouth daily.         Discharge Exam: Filed Weights   10/08/23 1407  Weight: 67 kg    GENERAL:   Alert, pleasant, no acute distress  HEENT:  EOMI CARDIOVASCULAR:  RRR, no murmurs appreciated RESPIRATORY:  Clear to auscultation, no wheezing, rales, or rhonchi GASTROINTESTINAL:  Soft, nontender, nondistended EXTREMITIES:  No LE edema bilaterally NEURO:  No new focal deficits appreciated SKIN:  No rashes noted PSYCH:  Appropriate mood and affect, anxious    Condition at discharge: improving  The results of significant diagnostics from this hospitalization (including imaging, microbiology, ancillary and laboratory) are listed below for reference.   Imaging Studies: CT Head Wo Contrast Result Date: 10/08/2023 CLINICAL DATA:  Head trauma EXAM: CT HEAD WITHOUT CONTRAST CT CERVICAL SPINE WITHOUT CONTRAST TECHNIQUE: Multidetector CT imaging of the head and cervical spine was performed following the standard protocol without intravenous contrast. Multiplanar CT image reconstructions of the cervical spine were also generated. RADIATION DOSE REDUCTION: This exam was performed according to the departmental dose-optimization program which includes automated exposure control, adjustment of the mA  and/or kV according to patient size and/or use of iterative reconstruction technique. COMPARISON:  09/23/2022 FINDINGS: CT HEAD FINDINGS Brain: No evidence of acute infarction, hemorrhage, hydrocephalus, extra-axial collection or mass lesion/mass effect. Vascular: No hyperdense vessel or unexpected calcification. Skull: Normal. Negative for fracture or focal lesion. Sinuses/Orbits: No acute finding. Other: None. CT CERVICAL SPINE FINDINGS Alignment: Normal. Skull base and vertebrae: No acute fracture. No primary bone lesion or focal pathologic process. Soft tissues and spinal canal: No prevertebral fluid or swelling. No visible canal hematoma. Disc levels: Moderate multilevel cervical disc degenerative disease with large anterior bridging osteophytes. Upper chest: Negative. Other: None. IMPRESSION: 1. No acute  intracranial pathology. 2. No fracture or static subluxation of the cervical spine. 3. Moderate multilevel cervical disc degenerative disease with large anterior bridging osteophytes. Electronically Signed   By: Marolyn JONETTA Jaksch M.D.   On: 10/08/2023 19:31   CT Cervical Spine Wo Contrast Result Date: 10/08/2023 CLINICAL DATA:  Head trauma EXAM: CT HEAD WITHOUT CONTRAST CT CERVICAL SPINE WITHOUT CONTRAST TECHNIQUE: Multidetector CT imaging of the head and cervical spine was performed following the standard protocol without intravenous contrast. Multiplanar CT image reconstructions of the cervical spine were also generated. RADIATION DOSE REDUCTION: This exam was performed according to the departmental dose-optimization program which includes automated exposure control, adjustment of the mA and/or kV according to patient size and/or use of iterative reconstruction technique. COMPARISON:  09/23/2022 FINDINGS: CT HEAD FINDINGS Brain: No evidence of acute infarction, hemorrhage, hydrocephalus, extra-axial collection or mass lesion/mass effect. Vascular: No hyperdense vessel or unexpected calcification. Skull: Normal. Negative for fracture or focal lesion. Sinuses/Orbits: No acute finding. Other: None. CT CERVICAL SPINE FINDINGS Alignment: Normal. Skull base and vertebrae: No acute fracture. No primary bone lesion or focal pathologic process. Soft tissues and spinal canal: No prevertebral fluid or swelling. No visible canal hematoma. Disc levels: Moderate multilevel cervical disc degenerative disease with large anterior bridging osteophytes. Upper chest: Negative. Other: None. IMPRESSION: 1. No acute intracranial pathology. 2. No fracture or static subluxation of the cervical spine. 3. Moderate multilevel cervical disc degenerative disease with large anterior bridging osteophytes. Electronically Signed   By: Marolyn JONETTA Jaksch M.D.   On: 10/08/2023 19:31   DG Knee Complete 4 Views Right Result Date: 10/08/2023 CLINICAL  DATA:  Bilateral knee pain after fall. EXAM: RIGHT KNEE - COMPLETE 4+ VIEW COMPARISON:  None Available. FINDINGS: No evidence of fracture, dislocation, or joint effusion. No evidence of arthropathy or other focal bone abnormality. Soft tissues are unremarkable. IMPRESSION: Negative. Electronically Signed   By: Lynwood Landy Raddle M.D.   On: 10/08/2023 16:30   DG Knee Complete 4 Views Left Result Date: 10/08/2023 CLINICAL DATA:  Bilateral knee pain after fall. EXAM: LEFT KNEE - COMPLETE 4+ VIEW COMPARISON:  None Available. FINDINGS: No evidence of fracture, dislocation, or joint effusion. No evidence of arthropathy or other focal bone abnormality. Soft tissues are unremarkable. IMPRESSION: Negative. Electronically Signed   By: Lynwood Landy Raddle M.D.   On: 10/08/2023 16:29    Microbiology: Results for orders placed or performed during the hospital encounter of 11/18/18  SARS Coronavirus 2 [facility] order, Performed in [facility] hospital lab) Nasopharyngeal Nasopharyngeal Swab     Status: None   Collection Time: 11/18/18  1:47 AM   Specimen: Nasopharyngeal Swab  Result Value Ref Range Status   SARS Coronavirus 2 NEGATIVE NEGATIVE Final    Comment: (NOTE) If result is NEGATIVE SARS-CoV-2 target nucleic acids are NOT DETECTED. The SARS-CoV-2 RNA is generally  detectable in upper and lower  respiratory specimens during the acute phase of infection. The lowest  concentration of SARS-CoV-2 viral copies this assay can detect is 250  copies / mL. A negative result does not preclude SARS-CoV-2 infection  and should not be used as the sole basis for treatment or other  patient management decisions.  A negative result may occur with  improper specimen collection / handling, submission of specimen other  than nasopharyngeal swab, presence of viral mutation(s) within the  areas targeted by this assay, and inadequate number of viral copies  (<250 copies / mL). A negative result must be combined with clinical   observations, patient history, and epidemiological information. If result is POSITIVE SARS-CoV-2 target nucleic acids are DETECTED. The SARS-CoV-2 RNA is generally detectable in upper and lower  respiratory specimens dur ing the acute phase of infection.  Positive  results are indicative of active infection with SARS-CoV-2.  Clinical  correlation with patient history and other diagnostic information is  necessary to determine patient infection status.  Positive results do  not rule out bacterial infection or co-infection with other viruses. If result is PRESUMPTIVE POSTIVE SARS-CoV-2 nucleic acids MAY BE PRESENT.   A presumptive positive result was obtained on the submitted specimen  and confirmed on repeat testing.  While 2019 novel coronavirus  (SARS-CoV-2) nucleic acids may be present in the submitted sample  additional confirmatory testing may be necessary for epidemiological  and / or clinical management purposes  to differentiate between  SARS-CoV-2 and other Sarbecovirus currently known to infect humans.  If clinically indicated additional testing with an alternate test  methodology [phone]) is advised. The SARS-CoV-2 RNA is generally  detectable in upper and lower respiratory sp ecimens during the acute  phase of infection. The expected result is Negative. Fact Sheet for Patients:  BoilerBrush.com.cy Fact Sheet for Healthcare Providers: https://pope.com/ This test is not yet approved or cleared by the [place]  FDA and has been authorized for detection and/or diagnosis of SARS-CoV-2 by FDA under an Emergency Use Authorization (EUA).  This EUA will remain in effect (meaning this test can be used) for the duration of the COVID-19 declaration under Section 564(b)(1) of the Act, 21 U.S.C. section 360bbb-3(b)(1), unless the authorization is terminated or revoked sooner. Performed at [facility], [address]  [place], [place] [address]     Labs: CBC: Recent Labs  Lab 10/08/23 1806 10/09/23 0721 10/10/23 0722  WBC 4.7 5.3 4.5  NEUTROABS 2.9  --   --   HGB 11.2* 10.3* 10.8*  HCT 33.1* 31.2* 32.3*  MCV 92.7 94.0 94.4  PLT 206 192 196   Basic Metabolic Panel: Recent Labs  Lab 10/08/23 1806 10/09/23 0721 10/10/23 0722  NA 130* 134* 138  K 4.3 4.2 4.1  CL 107 104 106  CO2 20* 23 24  GLUCOSE 140* 104* 134*  BUN 29* 27* 22  CREATININE 2.06* 1.73* 1.62*  CALCIUM  8.0* 8.7* 8.9  MG 2.2  --  2.0   Liver Function Tests: Recent Labs  Lab 10/08/23 1806  AST 21  ALT 13  ALKPHOS 87  BILITOT 1.0  PROT 7.7  ALBUMIN 3.9   CBG: Recent Labs  Lab 10/09/23 0937 10/09/23 1120 10/09/23 1702 10/09/23 2048 10/10/23 0734  GLUCAP 245* 163* 133* 201* 128*    Discharge time spent: 28 minutes.  Length of inpatient stay: 2 days  Signed: Carliss LELON Canales, DO [facility] 10/10/2023

## 2023-10-12 ENCOUNTER — Telehealth: Payer: Self-pay | Admitting: *Deleted

## 2023-10-12 ENCOUNTER — Other Ambulatory Visit (HOSPITAL_BASED_OUTPATIENT_CLINIC_OR_DEPARTMENT_OTHER): Payer: Self-pay | Admitting: Nurse Practitioner

## 2023-10-12 DIAGNOSIS — E2839 Other primary ovarian failure: Secondary | ICD-10-CM

## 2023-10-12 NOTE — Transitions of Care (Post Inpatient/ED Visit) (Signed)
   10/12/2023  Name: Lisa [name] MRN: [id number] DOB: [birth year]  Today's TOC FU Call Status: Today's TOC FU Call Status:: Unsuccessful Call (1st Attempt) Unsuccessful Call (1st Attempt) Date: 10/12/23  Attempted to reach the patient regarding the most recent Inpatient/ED visit.  Follow Up Plan: Additional outreach attempts will be made to reach the patient to complete the Transitions of Care (Post Inpatient/ED visit) call.   Cathlean Headland BSN RN [facility] Health Care Management Coordinator [email or web address] Direct Dial: [phone]  Fax: [phone] Website: [email or web address]

## 2023-10-13 ENCOUNTER — Telehealth: Payer: Self-pay

## 2023-10-13 NOTE — Transitions of Care (Post Inpatient/ED Visit) (Signed)
   10/13/2023  Name: Lillybeth Tal MRN: [id number] DOB: 1950-10-21  Today's TOC FU Call Status: Today's TOC FU Call Status:: Unsuccessful Call (2nd Attempt) Unsuccessful Call (2nd Attempt) Date: 10/13/23  Attempted to reach the patient regarding the most recent Inpatient/ED visit.  Follow Up Plan: Additional outreach attempts will be made to reach the patient to complete the Transitions of Care (Post Inpatient/ED visit) call.   Javelle Donigan J. Rumi Kolodziej RN, MSN [facility], [facility] Health RN Care Manager Direct Dial: [phone]  Fax: [phone] Website: [email or web address]

## 2023-10-14 ENCOUNTER — Telehealth: Payer: Self-pay | Admitting: *Deleted

## 2023-10-14 NOTE — Transitions of Care (Post Inpatient/ED Visit) (Signed)
   10/14/2023  Name: Lisa [name] MRN: [id number] DOB: Nov 05, 1950  Today's TOC FU Call Status: Today's TOC FU Call Status:: Unsuccessful Call (3rd Attempt) Unsuccessful Call (3rd Attempt) Date: 10/14/23  Attempted to reach the patient regarding the most recent Inpatient/ED visit.  Follow Up Plan: No further outreach attempts will be made at this time. We have been unable to contact the patient.  Cathlean Headland BSN RN  [facility] Health Care Management Coordinator [email or web address] Direct Dial: [phone]  Fax: [phone] Website: [email or web address]

## 2023-11-19 ENCOUNTER — Ambulatory Visit: Admitting: Podiatry

## 2023-12-11 ENCOUNTER — Encounter: Payer: Self-pay | Admitting: Podiatry

## 2023-12-11 ENCOUNTER — Ambulatory Visit: Admitting: Podiatry

## 2023-12-11 DIAGNOSIS — B351 Tinea unguium: Secondary | ICD-10-CM | POA: Diagnosis not present

## 2023-12-11 DIAGNOSIS — M79674 Pain in right toe(s): Secondary | ICD-10-CM | POA: Diagnosis not present

## 2023-12-11 DIAGNOSIS — M79675 Pain in left toe(s): Secondary | ICD-10-CM | POA: Diagnosis not present

## 2023-12-11 DIAGNOSIS — E1140 Type 2 diabetes mellitus with diabetic neuropathy, unspecified: Secondary | ICD-10-CM

## 2023-12-11 NOTE — Progress Notes (Addendum)
 This patient returns to my office for at risk foot care.  This patient requires this care by a professional since this patient will be at risk due to having diabetes.  This patient is unable to cut nails herself since the patient cannot reach her nails.These nails are painful walking and wearing shoes.  This patient presents for at risk foot care today.  General Appearance  Alert, conversant and in no acute stress.  Vascular  Dorsalis pedis and posterior tibial  pulses are palpable  bilaterally.  Capillary return is within normal limits  bilaterally. Temperature is within normal limits  bilaterally.  Neurologic  Senn-Weinstein monofilament wire test within normal limits  bilaterally. Muscle power within normal limits bilaterally.  Nails Thick disfigured discolored nails with subungual debris  from hallux to fifth toes bilaterally. No evidence of bacterial infection or drainage bilaterally.  Orthopedic  No limitations of motion  feet .  No crepitus or effusions noted.  No bony pathology or digital deformities noted.  Skin  normotropic skin with no porokeratosis noted bilaterally.  No signs of infections or ulcers noted.     Onychomycosis  Pain in right toes  Pain in left toes  Consent was obtained for treatment procedures.   Mechanical debridement of nails 1-5  bilaterally performed with a nail nipper.  Filed with dremel without incident.    Return office visit   3 months                   Told patient to return for periodic foot care and evaluation due to potential at risk complications.   Cordella Bold DPM  tomma

## 2024-01-17 ENCOUNTER — Emergency Department (HOSPITAL_COMMUNITY)

## 2024-01-17 ENCOUNTER — Other Ambulatory Visit: Payer: Self-pay

## 2024-01-17 ENCOUNTER — Encounter (HOSPITAL_COMMUNITY): Payer: Self-pay

## 2024-01-17 ENCOUNTER — Other Ambulatory Visit (HOSPITAL_COMMUNITY): Payer: Self-pay

## 2024-01-17 ENCOUNTER — Emergency Department (HOSPITAL_COMMUNITY)
Admission: EM | Admit: 2024-01-17 | Discharge: 2024-01-17 | Disposition: A | Discharge status: Home / Self Care | Attending: Emergency Medicine | Admitting: Emergency Medicine

## 2024-01-17 DIAGNOSIS — M5442 Lumbago with sciatica, left side: Secondary | ICD-10-CM | POA: Insufficient documentation

## 2024-01-17 DIAGNOSIS — E119 Type 2 diabetes mellitus without complications: Secondary | ICD-10-CM | POA: Insufficient documentation

## 2024-01-17 DIAGNOSIS — Z79899 Other long term (current) drug therapy: Secondary | ICD-10-CM | POA: Insufficient documentation

## 2024-01-17 DIAGNOSIS — R202 Paresthesia of skin: Secondary | ICD-10-CM

## 2024-01-17 DIAGNOSIS — I10 Essential (primary) hypertension: Secondary | ICD-10-CM | POA: Diagnosis not present

## 2024-01-17 DIAGNOSIS — I2510 Atherosclerotic heart disease of native coronary artery without angina pectoris: Secondary | ICD-10-CM | POA: Diagnosis not present

## 2024-01-17 DIAGNOSIS — D259 Leiomyoma of uterus, unspecified: Secondary | ICD-10-CM | POA: Insufficient documentation

## 2024-01-17 DIAGNOSIS — Z7901 Long term (current) use of anticoagulants: Secondary | ICD-10-CM | POA: Diagnosis not present

## 2024-01-17 DIAGNOSIS — Z8673 Personal history of transient ischemic attack (TIA), and cerebral infarction without residual deficits: Secondary | ICD-10-CM | POA: Diagnosis not present

## 2024-01-17 DIAGNOSIS — M545 Low back pain, unspecified: Secondary | ICD-10-CM | POA: Diagnosis present

## 2024-01-17 LAB — LIPASE, BLOOD: Lipase: 35 U/L (ref 11–51)

## 2024-01-17 LAB — COMPREHENSIVE METABOLIC PANEL WITH GFR
ALT: 12 U/L (ref 0–44)
AST: 24 U/L (ref 15–41)
Albumin: 3.5 g/dL (ref 3.5–5.0)
Alkaline Phosphatase: 113 U/L (ref 38–126)
Anion gap: 10 (ref 5–15)
BUN: 22 mg/dL (ref 8–23)
CO2: 25 mmol/L (ref 22–32)
Calcium: 8.7 mg/dL — ABNORMAL LOW (ref 8.9–10.3)
Chloride: 103 mmol/L (ref 98–111)
Creatinine, Ser: 1.43 mg/dL — ABNORMAL HIGH (ref 0.44–1.00)
GFR, Estimated: 39 mL/min — ABNORMAL LOW (ref >60)
Glucose, Bld: 191 mg/dL — ABNORMAL HIGH (ref 70–99)
Potassium: 4.4 mmol/L (ref 3.5–5.1)
Sodium: 138 mmol/L (ref 135–145)
Total Bilirubin: 0.7 mg/dL (ref 0.0–1.2)
Total Protein: 7.1 g/dL (ref 6.5–8.1)

## 2024-01-17 LAB — CBC
HCT: 34.5 % — ABNORMAL LOW (ref 36.0–46.0)
Hemoglobin: 11.2 g/dL — ABNORMAL LOW (ref 12.0–15.0)
MCH: 30.8 pg (ref 26.0–34.0)
MCHC: 32.5 g/dL (ref 30.0–36.0)
MCV: 94.8 fL (ref 80.0–100.0)
Platelets: 225 K/uL (ref 150–400)
RBC: 3.64 MIL/uL — ABNORMAL LOW (ref 3.87–5.11)
RDW: 12.2 % (ref 11.5–15.5)
WBC: 4.7 K/uL (ref 4.0–10.5)
nRBC: 0 % (ref 0.0–0.2)

## 2024-01-17 MED ORDER — FENTANYL CITRATE PF 50 MCG/ML IJ SOSY
25.0000 ug | PREFILLED_SYRINGE | Freq: Once | INTRAMUSCULAR | Status: AC
  Administered 2024-01-17: 25 ug via INTRAVENOUS
  Filled 2024-01-17: qty 1

## 2024-01-17 MED ORDER — OXYCODONE HCL 5 MG PO TABS
5.0000 mg | ORAL_TABLET | ORAL | 0 refills | Status: AC | PRN
  Filled 2024-01-17: qty 5, 1d supply, fill #0

## 2024-01-17 MED ORDER — FENTANYL CITRATE PF 50 MCG/ML IJ SOSY
50.0000 ug | PREFILLED_SYRINGE | Freq: Once | INTRAMUSCULAR | Status: AC
  Administered 2024-01-17: 50 ug via INTRAVENOUS
  Filled 2024-01-17: qty 1

## 2024-01-17 MED ORDER — IOHEXOL 350 MG/ML SOLN
75.0000 mL | Freq: Once | INTRAVENOUS | Status: AC | PRN
  Administered 2024-01-17: 75 mL via INTRAVENOUS

## 2024-01-17 NOTE — ED Provider Notes (Signed)
 [place] [facility] AT [facility] Provider Note   CSN: [id number] Arrival date & time: 01/17/24  9185     Patient presents with: Back Pain   Lisa [name] is a 73 y.o. female.   Patient reports that for the past few weeks, the left side of her lower back has been hurting.  She reports pain is also traveling down her legs into her hips and bilateral knees. Patient also notes that the left lower side of her abdomen is hurting as well. These pains have been progressively worsening, rates pain 10/10 right now. Reports that back does not usually hurt like this. Patient denies recent falls, injuries. Patient denies loss of bowel or bladder control, denies weakness in her lower extremities. Patient denies dysuria, nausea/vomiting.  Denies recent fevers and chills.  Reports her urine is clear. Last BM was yesterday morning, not dark/melanotic.  Reports occasional very dark stools, most recently 1 month ago.  Patient reports she has a history of abdominal ulcers which previously caused hematemesis requiring transfusion.   Back Pain Associated symptoms: abdominal pain   Associated symptoms: no chest pain, no dysuria and no fever      Prior to Admission medications   Medication Sig Start Date End Date Taking? Authorizing Provider  Accu-Chek Softclix Lancets lancets Use as instructed 05/19/22   [name], [name], NP  acetaminophen  (TYLENOL ) 500 MG tablet Take 1,000 mg by mouth every 6 (six) hours as needed for moderate pain or mild pain.    [provider]  atorvastatin  (LIPITOR) 40 MG tablet Take 40 mg by mouth daily.    [provider]  camphor-menthol VIKKI) lotion Apply 1 Application topically as needed for itching. 04/01/23   [name], Tinnie LABOR, NP  carvedilol  (COREG ) 12.5 MG tablet Take 1 tablet (12.5 mg total) by mouth 2 (two) times daily with a meal. 09/25/22   Jens Durand, MD  cholecalciferol (VITAMIN D3) 25 MCG (1000 UNIT) tablet Take 1  tablet (1,000 Units total) by mouth in the morning and at bedtime. Patient taking differently: Take 1,000 Units by mouth daily. 09/25/22   Jens Durand, MD  clopidogrel  (PLAVIX ) 75 MG tablet Take 1 tablet (75 mg total) by mouth daily. 05/07/22   Ghimire, Donalda HERO, MD  diclofenac  Sodium (VOLTAREN ) 1 % GEL APPLY 2 G TOPICALLY 4 (FOUR) TIMES DAILY. Patient taking differently: Apply 1 Application topically daily as needed (pain). 12/19/22   [name], [name], NP  DULoxetine  (CYMBALTA ) 30 MG capsule TAKE 1 CAPSULE BY MOUTH DAILY 09/01/23   [name], [name], NP  ENTRESTO  97-103 MG TAKE 1 TABLET BY MOUTH TWICE  DAILY 08/11/23   [name], [name], NP  glucose blood (ACCU-CHEK AVIVA PLUS) test strip Use as instructed 05/19/22   [name], [name], NP  glucose blood (ONETOUCH ULTRA) test strip Use as instructed 04/17/22   [name], [name], NP  hydrALAZINE  (APRESOLINE ) 25 MG tablet Take 25 mg by mouth 2 (two) times daily. 10/15/22   [provider]  loratadine  (CLARITIN ) 10 MG tablet Take 1 tablet (10 mg total) by mouth daily. 06/22/23   [name], Tinnie LABOR, NP  Multiple Vitamin (MULTIVITAMIN) tablet Take 1 tablet by mouth daily. 06/22/23   [name], [name], NP  nortriptyline  (PAMELOR ) 10 MG capsule TAKE 1 CAPSULE BY MOUTH AT  BEDTIME 07/21/23   [name], [name], NP  ondansetron  (ZOFRAN ) 4 MG tablet Take 1 tablet (4 mg total) by mouth every 6 (six) hours as needed for nausea. 10/10/23  Arlon Carliss ORN, DO  pantoprazole  (PROTONIX ) 40 MG tablet Take 1 tablet (40 mg total) by mouth 2 (two) times daily. 10/10/23   Arlon Carliss ORN, DO  polyethylene glycol powder (GLYCOLAX /MIRALAX ) 17 GM/SCOOP powder Take 17 g by mouth 2 (two) times daily as needed for mild constipation. 06/22/23   [name], [name], NP  sucralfate  (CARAFATE ) 1 GM/10ML suspension Take 10 mLs (1 g total) by mouth 4 (four) times daily -  with meals and at bedtime. 10/10/23   Arlon Carliss ORN, DO  triamcinolone cream (KENALOG) 0.1 % Apply 1 Application  topically 2 (two) times daily. 09/18/23   [provider]  triamterene -hydrochlorothiazide  (MAXZIDE-25) 37.5-25 MG tablet Take 1 tablet by mouth daily. 06/22/23   [name], [name], NP    Allergies: Ace inhibitors, Aspirin , Codeine, and Hydrocodone    Review of Systems  Constitutional:  Negative for chills and fever.  HENT:  Negative for ear pain and sore throat.   Eyes:  Negative for pain and visual disturbance.  Respiratory:  Negative for cough and shortness of breath.   Cardiovascular:  Negative for chest pain and palpitations.  Gastrointestinal:  Positive for abdominal pain. Negative for blood in stool, diarrhea and vomiting.  Genitourinary:  Negative for dysuria and hematuria.  Musculoskeletal:  Positive for back pain and neck pain. Negative for arthralgias and joint swelling.  Skin:  Negative for color change and rash.  Neurological:  Negative for seizures and syncope.  All other systems reviewed and are negative.   Updated Vital Signs BP (!) 172/94   Pulse 87   Temp 98.1 F (36.7 C) (Oral)   Resp 18   SpO2 100%   Physical Exam Vitals and nursing note reviewed.  Constitutional:      General: She is not in acute distress.    Appearance: She is well-developed.  HENT:     Head: Normocephalic and atraumatic.  Eyes:     Conjunctiva/sclera: Conjunctivae normal.  Cardiovascular:     Rate and Rhythm: Normal rate and regular rhythm.     Heart sounds: No murmur heard. Pulmonary:     Effort: Pulmonary effort is normal. No respiratory distress.     Breath sounds: Normal breath sounds.  Abdominal:     General: Bowel sounds are normal. There is no distension.     Palpations: Abdomen is soft. There is no mass.     Tenderness: There is abdominal tenderness (LLQ). There is left CVA tenderness and guarding. There is no right CVA tenderness or rebound.  Musculoskeletal:        General: No swelling.     Cervical back: Neck supple.     Lumbar back: Tenderness (L lateral)  present. No swelling. Positive right straight leg raise test. Negative left straight leg raise test.  Skin:    General: Skin is warm and dry.     Capillary Refill: Capillary refill takes less than 2 seconds.  Neurological:     General: No focal deficit present.     Mental Status: She is alert and oriented to person, place, and time.  Psychiatric:        Mood and Affect: Mood normal.     (all labs ordered are listed, but only abnormal results are displayed) Labs Reviewed - No data to display  EKG: None  Radiology: No results found.   Procedures   Medications Ordered in the ED - No data to display  Medical Decision Making Janalyn Higby is a 73 yo female with pertinent PMH of CAD, T2DM, prior stroke, arthritis, and peptic ulcers presenting with LLQ abdominal pain and back pain radiating to abdomen and legs.  Initially considered UTI, pyelonephritis, AAA, diverticulitis, peptic or duodenal ulcer, gastritis, MSK pain, pancreatitis, kidney stone, or other abdominal pathology.  Given radiation of pain, kidney stone is a significant consideration initially.  No concern for cauda equina given no red flag symptoms on history.  Patient does not have fever or any evidence of infection.  On initial exam, patient does have some left flank pain and significant LLQ pain to palpation. CBC was stable chronic anemia no leukocytosis.  CMP with chronically elevated creatinine but at baseline, suspect CKD component.  Lipase within normal limits.  UA obtained and pending at time of discharge but no evidence of systemic infection and no dysuria to support UTI.  Independently reviewed imaging: CT abdomen pelvis with contrast did not show any acute intraabdominal or spinal pathology or renal stones. XR thoracic and lumbar spines obtained, no acute listhesis or trauma observed.  Pain was treated with fentanyl  IV and on reevaluation patient's pain had markedly improved  and she was able to ambulate.  Suspect acute on chronic exacerbation of lumbar back pain pain with likely sciatica.  Patient was discharged with oxycodone 5 mg x 5 pills sent to [facility] pharmacy for pickup.  Discussed Tylenol  Q6h PRN.  Recommended following up with PCP for discussion of physical therapy and other chronic back pain treatments.  Amount and/or Complexity of Data Reviewed Labs: ordered. Radiology: ordered and independent interpretation performed. Decision-making details documented in ED Course. ECG/medicine tests: ordered and independent interpretation performed. Decision-making details documented in ED Course.  Risk Prescription drug management.      Final diagnoses:  None    ED Discharge Orders     None        Kenika Sahm, MD 01/17/24 [id number]    Doretha Folks, MD 01/20/24 [phone]

## 2024-01-17 NOTE — ED Triage Notes (Signed)
 Here for back pain that has been going on for a couple of months, but has been getting worse over the past few weeks. Hx HTN.

## 2024-01-17 NOTE — Discharge Instructions (Addendum)
 The scans of your back did not show any problems in your stomach or intestines and there are no short-term issues going on in your spine.  Some of your spines and your lower lumbar spine have slipped slightly, but there is no surgery or short-term treatment for this.  Longer-term physical therapy will be a great option for treatment and we recommend following up with your primary care doctor to discuss plans for this.  We have obtained x-rays of your lumbar and thoracic spine which will hopefully be helpful for your primary care doctor.  We are sending a small amount of oxycodone for breakthrough pain, otherwise I recommend taking Tylenol  (acetaminophen ) every 6 hours up to a maximum daily dose of 3000 mg to help with your pain.

## 2024-01-17 NOTE — ED Provider Notes (Incomplete)
 Patient is a 73 year old female with a history of diabetes, hypertension, CAD, stroke and prior acute renal failure who is presenting today with complaint of back pain.  The back pain she describes in her mid to lower back that goes down into her legs has been waxing and waning over several months but it seems that she has had a worsening pain in the last week that is a little bit different on the left side and radiates down into the left lower abdomen which was severe this morning.  She reports walking makes it worse but she has not had any nausea or vomiting.  Bowel movements have not significantly changed and she is urinating without difficulty.  She is able to lift her legs without any difficulty and it does not make the pain worse.  She has significant left lower quadrant pain and left flank pain on exam.  Concern for renal stone, diverticulitis or other GI pathology.  Lower suspicion for AAA.  Also could be musculoskeletal from radiculopathy.  There is no evidence of cauda equina at this time and patient denies any infectious symptoms.   Doretha Folks, MD 01/20/24 [phone]

## 2024-02-23 ENCOUNTER — Other Ambulatory Visit

## 2024-02-29 ENCOUNTER — Other Ambulatory Visit: Payer: Self-pay | Admitting: Nurse Practitioner

## 2024-02-29 ENCOUNTER — Inpatient Hospital Stay: Admission: RE | Admit: 2024-02-29 | Payer: Medicare HMO | Source: Ambulatory Visit

## 2024-02-29 DIAGNOSIS — Z1231 Encounter for screening mammogram for malignant neoplasm of breast: Secondary | ICD-10-CM

## 2024-03-02 ENCOUNTER — Encounter

## 2024-03-03 ENCOUNTER — Encounter

## 2024-03-16 ENCOUNTER — Ambulatory Visit: Admitting: Podiatry

## 2024-04-21 ENCOUNTER — Other Ambulatory Visit: Payer: Self-pay | Admitting: Nurse Practitioner

## 2024-04-21 DIAGNOSIS — R413 Other amnesia: Secondary | ICD-10-CM

## 2024-04-21 DIAGNOSIS — I701 Atherosclerosis of renal artery: Secondary | ICD-10-CM

## 2024-05-06 ENCOUNTER — Ambulatory Visit
Admission: RE | Admit: 2024-05-06 | Discharge: 2024-05-06 | Disposition: A | Source: Ambulatory Visit | Attending: Nurse Practitioner

## 2024-05-06 DIAGNOSIS — I701 Atherosclerosis of renal artery: Secondary | ICD-10-CM

## 2024-05-09 ENCOUNTER — Ambulatory Visit: Admitting: Podiatry

## 2024-05-13 ENCOUNTER — Ambulatory Visit: Admitting: Podiatry

## 2024-05-22 ENCOUNTER — Other Ambulatory Visit

## 2024-06-13 ENCOUNTER — Ambulatory Visit: Payer: 59
